# Patient Record
Sex: Female | Born: 1970 | Race: White | Hispanic: No | Smoking: Never smoker
Health system: Southern US, Community
[De-identification: ages and names within clinical notes are randomized; demographics above are authoritative.]

## PROBLEM LIST (undated history)

## (undated) DIAGNOSIS — Q631 Lobulated, fused and horseshoe kidney: Secondary | ICD-10-CM

## (undated) DIAGNOSIS — T7840XA Allergy, unspecified, initial encounter: Secondary | ICD-10-CM

## (undated) DIAGNOSIS — M1990 Unspecified osteoarthritis, unspecified site: Secondary | ICD-10-CM

## (undated) DIAGNOSIS — E079 Disorder of thyroid, unspecified: Secondary | ICD-10-CM

## (undated) DIAGNOSIS — D649 Anemia, unspecified: Secondary | ICD-10-CM

## (undated) DIAGNOSIS — Z8669 Personal history of other diseases of the nervous system and sense organs: Secondary | ICD-10-CM

## (undated) DIAGNOSIS — I10 Essential (primary) hypertension: Secondary | ICD-10-CM

## (undated) DIAGNOSIS — N029 Recurrent and persistent hematuria with unspecified morphologic changes: Secondary | ICD-10-CM

## (undated) DIAGNOSIS — E785 Hyperlipidemia, unspecified: Secondary | ICD-10-CM

## (undated) DIAGNOSIS — N920 Excessive and frequent menstruation with regular cycle: Secondary | ICD-10-CM

## (undated) HISTORY — DX: Unspecified osteoarthritis, unspecified site: M19.90

## (undated) HISTORY — DX: Personal history of other diseases of the nervous system and sense organs: Z86.69

## (undated) HISTORY — DX: Essential (primary) hypertension: I10

## (undated) HISTORY — PX: CHOLECYSTECTOMY: SHX55

## (undated) HISTORY — DX: Excessive and frequent menstruation with regular cycle: N92.0

## (undated) HISTORY — PX: TYMPANOSTOMY: SHX2586

## (undated) HISTORY — DX: Allergy, unspecified, initial encounter: T78.40XA

## (undated) HISTORY — DX: Hyperlipidemia, unspecified: E78.5

## (undated) HISTORY — PX: TUBAL LIGATION: SHX77

## (undated) HISTORY — DX: Anemia, unspecified: D64.9

## (undated) HISTORY — DX: Lobulated, fused and horseshoe kidney: Q63.1

## (undated) HISTORY — PX: SHOULDER SURGERY: SHX246

## (undated) HISTORY — DX: Recurrent and persistent hematuria with unspecified morphologic changes: N02.9

---

## 1999-01-28 ENCOUNTER — Other Ambulatory Visit: Admission: RE | Admit: 1999-01-28 | Discharge: 1999-01-28 | Payer: Self-pay | Admitting: *Deleted

## 1999-09-27 ENCOUNTER — Encounter: Payer: Self-pay | Admitting: Emergency Medicine

## 1999-09-27 ENCOUNTER — Emergency Department (HOSPITAL_COMMUNITY): Admission: EM | Admit: 1999-09-27 | Discharge: 1999-09-27 | Payer: Self-pay | Admitting: Emergency Medicine

## 2000-09-07 ENCOUNTER — Other Ambulatory Visit: Admission: RE | Admit: 2000-09-07 | Discharge: 2000-09-07 | Payer: Self-pay | Admitting: *Deleted

## 2001-02-06 ENCOUNTER — Inpatient Hospital Stay (HOSPITAL_COMMUNITY): Admission: AD | Admit: 2001-02-06 | Discharge: 2001-02-06 | Payer: Self-pay | Admitting: *Deleted

## 2001-02-27 ENCOUNTER — Inpatient Hospital Stay (HOSPITAL_COMMUNITY): Admission: AD | Admit: 2001-02-27 | Discharge: 2001-02-27 | Payer: Self-pay | Admitting: Obstetrics and Gynecology

## 2001-02-28 ENCOUNTER — Inpatient Hospital Stay (HOSPITAL_COMMUNITY): Admission: AD | Admit: 2001-02-28 | Discharge: 2001-02-28 | Payer: Self-pay | Admitting: Obstetrics and Gynecology

## 2001-03-20 ENCOUNTER — Inpatient Hospital Stay (HOSPITAL_COMMUNITY): Admission: AD | Admit: 2001-03-20 | Discharge: 2001-03-22 | Payer: Self-pay | Admitting: Obstetrics and Gynecology

## 2001-03-20 ENCOUNTER — Encounter (INDEPENDENT_AMBULATORY_CARE_PROVIDER_SITE_OTHER): Payer: Self-pay

## 2001-05-03 ENCOUNTER — Other Ambulatory Visit: Admission: RE | Admit: 2001-05-03 | Discharge: 2001-05-03 | Payer: Self-pay | Admitting: Obstetrics and Gynecology

## 2001-11-12 ENCOUNTER — Emergency Department (HOSPITAL_COMMUNITY): Admission: EM | Admit: 2001-11-12 | Discharge: 2001-11-13 | Payer: Self-pay | Admitting: Emergency Medicine

## 2004-06-01 ENCOUNTER — Emergency Department (HOSPITAL_COMMUNITY): Admission: EM | Admit: 2004-06-01 | Discharge: 2004-06-01 | Payer: Self-pay | Admitting: Emergency Medicine

## 2005-04-15 ENCOUNTER — Emergency Department (HOSPITAL_COMMUNITY): Admission: EM | Admit: 2005-04-15 | Discharge: 2005-04-15 | Payer: Self-pay | Admitting: Emergency Medicine

## 2007-12-15 ENCOUNTER — Encounter: Payer: Self-pay | Admitting: Family Medicine

## 2008-01-05 ENCOUNTER — Encounter: Payer: Self-pay | Admitting: Family Medicine

## 2008-01-11 ENCOUNTER — Encounter: Payer: Self-pay | Admitting: Family Medicine

## 2008-01-24 ENCOUNTER — Encounter: Payer: Self-pay | Admitting: Family Medicine

## 2008-01-27 ENCOUNTER — Ambulatory Visit: Payer: Self-pay | Admitting: Family Medicine

## 2008-01-27 DIAGNOSIS — M1990 Unspecified osteoarthritis, unspecified site: Secondary | ICD-10-CM | POA: Insufficient documentation

## 2008-01-27 DIAGNOSIS — J309 Allergic rhinitis, unspecified: Secondary | ICD-10-CM | POA: Insufficient documentation

## 2008-01-27 DIAGNOSIS — D509 Iron deficiency anemia, unspecified: Secondary | ICD-10-CM | POA: Insufficient documentation

## 2008-01-28 ENCOUNTER — Telehealth: Payer: Self-pay | Admitting: Family Medicine

## 2008-01-31 LAB — CONVERTED CEMR LAB
ALT: 18 units/L (ref 0–35)
AST: 24 units/L (ref 0–37)
Albumin: 3.6 g/dL (ref 3.5–5.2)
Alkaline Phosphatase: 76 units/L (ref 39–117)
BUN: 8 mg/dL (ref 6–23)
Basophils Absolute: 0.2 10*3/uL — ABNORMAL HIGH (ref 0.0–0.1)
Bilirubin, Direct: 0.1 mg/dL (ref 0.0–0.3)
CO2: 29 meq/L (ref 19–32)
Calcium: 9 mg/dL (ref 8.4–10.5)
Chloride: 106 meq/L (ref 96–112)
Creatinine, Ser: 0.5 mg/dL (ref 0.4–1.2)
Eosinophils Absolute: 0.4 10*3/uL (ref 0.0–0.7)
Eosinophils Relative: 3.2 % (ref 0.0–5.0)
Ferritin: 2.8 ng/mL — ABNORMAL LOW (ref 10.0–291.0)
Folate: 16.5 ng/mL
GFR calc Af Amer: 178 mL/min
GFR calc non Af Amer: 147 mL/min
Glucose, Bld: 85 mg/dL (ref 70–99)
HCT: 25.6 % — ABNORMAL LOW (ref 36.0–46.0)
Hemoglobin: 7.8 g/dL — CL (ref 12.0–15.0)
Iron: 31 ug/dL — ABNORMAL LOW (ref 42–145)
Lymphocytes Relative: 7.5 % — ABNORMAL LOW (ref 12.0–46.0)
MCHC: 30.5 g/dL (ref 30.0–36.0)
MCV: 57.7 fL — ABNORMAL LOW (ref 78.0–100.0)
Monocytes Absolute: 0.1 10*3/uL (ref 0.1–1.0)
Monocytes Relative: 0.9 % — ABNORMAL LOW (ref 3.0–12.0)
Neutro Abs: 9.6 10*3/uL — ABNORMAL HIGH (ref 1.4–7.7)
Neutrophils Relative %: 86.6 % — ABNORMAL HIGH (ref 43.0–77.0)
Platelets: 506 10*3/uL — ABNORMAL HIGH (ref 150–400)
Potassium: 4.2 meq/L (ref 3.5–5.1)
RBC: 4.44 M/uL (ref 3.87–5.11)
RDW: 20.7 % — ABNORMAL HIGH (ref 11.5–14.6)
Sodium: 141 meq/L (ref 135–145)
TSH: 1.26 microintl units/mL (ref 0.35–5.50)
Total Bilirubin: 1 mg/dL (ref 0.3–1.2)
Total Protein: 8 g/dL (ref 6.0–8.3)
Vitamin B-12: 739 pg/mL (ref 211–911)
WBC: 10.8 10*3/uL — ABNORMAL HIGH (ref 4.5–10.5)

## 2008-02-02 ENCOUNTER — Other Ambulatory Visit: Admission: RE | Admit: 2008-02-02 | Discharge: 2008-02-02 | Payer: Self-pay | Admitting: Family Medicine

## 2008-02-02 ENCOUNTER — Ambulatory Visit: Payer: Self-pay | Admitting: Family Medicine

## 2008-02-02 ENCOUNTER — Encounter: Payer: Self-pay | Admitting: Family Medicine

## 2008-02-02 DIAGNOSIS — N920 Excessive and frequent menstruation with regular cycle: Secondary | ICD-10-CM | POA: Insufficient documentation

## 2008-02-08 ENCOUNTER — Encounter: Payer: Self-pay | Admitting: Family Medicine

## 2008-02-15 ENCOUNTER — Encounter: Admission: RE | Admit: 2008-02-15 | Discharge: 2008-02-15 | Payer: Self-pay | Admitting: Family Medicine

## 2008-03-03 ENCOUNTER — Telehealth: Payer: Self-pay | Admitting: Family Medicine

## 2008-04-24 ENCOUNTER — Ambulatory Visit: Payer: Self-pay | Admitting: Family Medicine

## 2008-04-26 LAB — CONVERTED CEMR LAB
Basophils Absolute: 0.1 10*3/uL (ref 0.0–0.1)
Basophils Relative: 1.4 % (ref 0.0–3.0)
Eosinophils Absolute: 0.4 10*3/uL (ref 0.0–0.7)
Eosinophils Relative: 4.3 % (ref 0.0–5.0)
Ferritin: 20.4 ng/mL (ref 10.0–291.0)
HCT: 40.5 % (ref 36.0–46.0)
Hemoglobin: 14.4 g/dL (ref 12.0–15.0)
Lymphocytes Relative: 31.7 % (ref 12.0–46.0)
MCHC: 35.5 g/dL (ref 30.0–36.0)
MCV: 88.3 fL (ref 78.0–100.0)
Monocytes Absolute: 0.8 10*3/uL (ref 0.1–1.0)
Monocytes Relative: 7.7 % (ref 3.0–12.0)
Neutro Abs: 5.4 10*3/uL (ref 1.4–7.7)
Neutrophils Relative %: 54.9 % (ref 43.0–77.0)
Platelets: 323 10*3/uL (ref 150–400)
RBC: 4.59 M/uL (ref 3.87–5.11)
RDW: 12.9 % (ref 11.5–14.6)
WBC: 9.8 10*3/uL (ref 4.5–10.5)

## 2008-06-28 ENCOUNTER — Ambulatory Visit: Payer: Self-pay | Admitting: Family Medicine

## 2008-06-28 DIAGNOSIS — J209 Acute bronchitis, unspecified: Secondary | ICD-10-CM | POA: Insufficient documentation

## 2008-07-14 ENCOUNTER — Encounter: Payer: Self-pay | Admitting: Family Medicine

## 2008-12-06 ENCOUNTER — Ambulatory Visit: Payer: Self-pay | Admitting: Family Medicine

## 2009-01-16 ENCOUNTER — Ambulatory Visit: Payer: Self-pay | Admitting: Family Medicine

## 2009-01-16 LAB — CONVERTED CEMR LAB
Bilirubin Urine: NEGATIVE
Glucose, Urine, Semiquant: NEGATIVE
Ketones, urine, test strip: NEGATIVE
Nitrite: NEGATIVE
Specific Gravity, Urine: >=1.03
Urobilinogen, UA: 0.2
WBC Urine, dipstick: NEGATIVE
pH: 5

## 2009-01-22 ENCOUNTER — Telehealth: Payer: Self-pay | Admitting: Family Medicine

## 2009-01-22 LAB — CONVERTED CEMR LAB
ALT: 20 units/L (ref 0–35)
AST: 23 units/L (ref 0–37)
Albumin: 3.5 g/dL (ref 3.5–5.2)
Alkaline Phosphatase: 71 units/L (ref 39–117)
BUN: 8 mg/dL (ref 6–23)
Basophils Absolute: 0.1 10*3/uL (ref 0.0–0.1)
Basophils Relative: 1 % (ref 0.0–3.0)
Bilirubin, Direct: 0 mg/dL (ref 0.0–0.3)
CO2: 23 meq/L (ref 19–32)
Calcium: 8.6 mg/dL (ref 8.4–10.5)
Chloride: 108 meq/L (ref 96–112)
Cholesterol: 242 mg/dL — ABNORMAL HIGH (ref 0–200)
Creatinine, Ser: 0.8 mg/dL (ref 0.4–1.2)
Direct LDL: 191.1 mg/dL
Eosinophils Absolute: 0.2 10*3/uL (ref 0.0–0.7)
Eosinophils Relative: 2.9 % (ref 0.0–5.0)
GFR calc non Af Amer: 84.76 mL/min (ref >60)
Glucose, Bld: 86 mg/dL (ref 70–99)
HCT: 38.6 % (ref 36.0–46.0)
HDL: 39.9 mg/dL (ref >39.00)
Hemoglobin: 13.1 g/dL (ref 12.0–15.0)
Lymphocytes Relative: 28 % (ref 12.0–46.0)
Lymphs Abs: 2.4 10*3/uL (ref 0.7–4.0)
MCHC: 33.8 g/dL (ref 30.0–36.0)
MCV: 87.3 fL (ref 78.0–100.0)
Monocytes Absolute: 0.6 10*3/uL (ref 0.1–1.0)
Monocytes Relative: 7.2 % (ref 3.0–12.0)
Neutro Abs: 5.3 10*3/uL (ref 1.4–7.7)
Neutrophils Relative %: 60.9 % (ref 43.0–77.0)
Platelets: 245 10*3/uL (ref 150.0–400.0)
Potassium: 3.8 meq/L (ref 3.5–5.1)
RBC: 4.42 M/uL (ref 3.87–5.11)
RDW: 13.7 % (ref 11.5–14.6)
Sodium: 140 meq/L (ref 135–145)
TSH: 3.04 microintl units/mL (ref 0.35–5.50)
Total Bilirubin: 0.6 mg/dL (ref 0.3–1.2)
Total CHOL/HDL Ratio: 6
Total Protein: 7.3 g/dL (ref 6.0–8.3)
Triglycerides: 176 mg/dL — ABNORMAL HIGH (ref 0.0–149.0)
VLDL: 35.2 mg/dL (ref 0.0–40.0)
WBC: 8.6 10*3/uL (ref 4.5–10.5)

## 2009-02-06 ENCOUNTER — Ambulatory Visit: Payer: Self-pay | Admitting: Family Medicine

## 2009-02-06 ENCOUNTER — Other Ambulatory Visit: Admission: RE | Admit: 2009-02-06 | Discharge: 2009-02-06 | Payer: Self-pay | Admitting: Family Medicine

## 2009-02-09 ENCOUNTER — Encounter: Payer: Self-pay | Admitting: Family Medicine

## 2009-02-14 ENCOUNTER — Encounter: Payer: Self-pay | Admitting: Family Medicine

## 2009-03-01 ENCOUNTER — Emergency Department (HOSPITAL_COMMUNITY): Admission: EM | Admit: 2009-03-01 | Discharge: 2009-03-01 | Payer: Self-pay | Admitting: Emergency Medicine

## 2009-03-21 ENCOUNTER — Ambulatory Visit: Payer: Self-pay | Admitting: Family Medicine

## 2009-03-21 DIAGNOSIS — H8110 Benign paroxysmal vertigo, unspecified ear: Secondary | ICD-10-CM | POA: Insufficient documentation

## 2009-03-21 DIAGNOSIS — J069 Acute upper respiratory infection, unspecified: Secondary | ICD-10-CM | POA: Insufficient documentation

## 2009-04-24 ENCOUNTER — Ambulatory Visit: Payer: Self-pay | Admitting: Family Medicine

## 2009-04-25 LAB — CONVERTED CEMR LAB
Cholesterol: 236 mg/dL — ABNORMAL HIGH (ref 0–200)
Direct LDL: 168 mg/dL
HDL: 53 mg/dL (ref >39.00)
Total CHOL/HDL Ratio: 4
Triglycerides: 178 mg/dL — ABNORMAL HIGH (ref 0.0–149.0)
VLDL: 35.6 mg/dL (ref 0.0–40.0)

## 2009-06-01 ENCOUNTER — Ambulatory Visit: Payer: Self-pay | Admitting: Family Medicine

## 2009-06-01 DIAGNOSIS — J019 Acute sinusitis, unspecified: Secondary | ICD-10-CM | POA: Insufficient documentation

## 2009-07-03 ENCOUNTER — Telehealth: Payer: Self-pay | Admitting: Family Medicine

## 2009-07-04 ENCOUNTER — Ambulatory Visit: Payer: Self-pay | Admitting: Family Medicine

## 2009-07-04 DIAGNOSIS — M546 Pain in thoracic spine: Secondary | ICD-10-CM | POA: Insufficient documentation

## 2009-07-31 ENCOUNTER — Ambulatory Visit: Payer: Self-pay | Admitting: Family Medicine

## 2009-07-31 ENCOUNTER — Telehealth: Payer: Self-pay | Admitting: Family Medicine

## 2009-08-09 LAB — CONVERTED CEMR LAB
ALT: 21 units/L (ref 0–35)
AST: 26 units/L (ref 0–37)
Albumin: 3.4 g/dL — ABNORMAL LOW (ref 3.5–5.2)
Alkaline Phosphatase: 80 units/L (ref 39–117)
Bilirubin, Direct: 0.1 mg/dL (ref 0.0–0.3)
Cholesterol: 203 mg/dL — ABNORMAL HIGH (ref 0–200)
Direct LDL: 137.1 mg/dL
HDL: 48.9 mg/dL (ref >39.00)
Total Bilirubin: 0.6 mg/dL (ref 0.3–1.2)
Total CHOL/HDL Ratio: 4
Total Protein: 7 g/dL (ref 6.0–8.3)
Triglycerides: 143 mg/dL (ref 0.0–149.0)
VLDL: 28.6 mg/dL (ref 0.0–40.0)

## 2009-08-10 ENCOUNTER — Telehealth: Payer: Self-pay | Admitting: Family Medicine

## 2009-09-26 ENCOUNTER — Encounter: Admission: RE | Admit: 2009-09-26 | Discharge: 2009-09-26 | Payer: Self-pay | Admitting: Family Medicine

## 2009-10-04 ENCOUNTER — Emergency Department (HOSPITAL_COMMUNITY): Admission: EM | Admit: 2009-10-04 | Discharge: 2009-10-04 | Payer: Self-pay | Admitting: Emergency Medicine

## 2009-10-16 ENCOUNTER — Ambulatory Visit: Payer: Self-pay | Admitting: Family Medicine

## 2009-10-16 DIAGNOSIS — K8020 Calculus of gallbladder without cholecystitis without obstruction: Secondary | ICD-10-CM | POA: Insufficient documentation

## 2009-10-16 DIAGNOSIS — Q638 Other specified congenital malformations of kidney: Secondary | ICD-10-CM | POA: Insufficient documentation

## 2009-10-30 ENCOUNTER — Encounter: Payer: Self-pay | Admitting: Family Medicine

## 2009-11-08 ENCOUNTER — Encounter: Payer: Self-pay | Admitting: Family Medicine

## 2009-11-23 ENCOUNTER — Ambulatory Visit: Payer: Self-pay | Admitting: Family Medicine

## 2009-11-23 DIAGNOSIS — N390 Urinary tract infection, site not specified: Secondary | ICD-10-CM | POA: Insufficient documentation

## 2009-11-23 LAB — CONVERTED CEMR LAB
Bilirubin Urine: NEGATIVE
Glucose, Urine, Semiquant: NEGATIVE
Ketones, urine, test strip: NEGATIVE
Nitrite: NEGATIVE
Protein, U semiquant: 100
Urobilinogen, UA: 0.2
pH: 7.5

## 2009-11-24 ENCOUNTER — Encounter: Payer: Self-pay | Admitting: Family Medicine

## 2009-12-11 ENCOUNTER — Ambulatory Visit: Payer: Self-pay | Admitting: Family Medicine

## 2010-01-14 ENCOUNTER — Ambulatory Visit (HOSPITAL_COMMUNITY): Admission: RE | Admit: 2010-01-14 | Discharge: 2010-01-14 | Payer: Self-pay | Admitting: Surgery

## 2010-01-29 ENCOUNTER — Telehealth: Payer: Self-pay | Admitting: Family Medicine

## 2010-02-05 ENCOUNTER — Encounter: Payer: Self-pay | Admitting: Family Medicine

## 2010-02-27 ENCOUNTER — Telehealth: Payer: Self-pay | Admitting: Family Medicine

## 2010-02-28 ENCOUNTER — Ambulatory Visit
Admission: RE | Admit: 2010-02-28 | Discharge: 2010-02-28 | Payer: Self-pay | Source: Home / Self Care | Attending: Family Medicine | Admitting: Family Medicine

## 2010-03-18 ENCOUNTER — Other Ambulatory Visit (HOSPITAL_COMMUNITY)
Admission: RE | Admit: 2010-03-18 | Discharge: 2010-03-18 | Disposition: A | Discharge status: Home / Self Care | Payer: BC Managed Care – HMO | Source: Ambulatory Visit | Attending: Family Medicine | Admitting: Family Medicine

## 2010-03-18 ENCOUNTER — Ambulatory Visit
Admission: RE | Admit: 2010-03-18 | Discharge: 2010-03-18 | Payer: Self-pay | Source: Home / Self Care | Attending: Family Medicine | Admitting: Family Medicine

## 2010-03-18 ENCOUNTER — Other Ambulatory Visit (HOSPITAL_COMMUNITY): Admission: RE | Admit: 2010-03-18 | Payer: BC Managed Care – HMO | Source: Ambulatory Visit | Admitting: Family Medicine

## 2010-03-18 ENCOUNTER — Other Ambulatory Visit: Payer: Self-pay | Admitting: Family Medicine

## 2010-03-18 DIAGNOSIS — Z01419 Encounter for gynecological examination (general) (routine) without abnormal findings: Secondary | ICD-10-CM | POA: Insufficient documentation

## 2010-03-18 DIAGNOSIS — M6740 Ganglion, unspecified site: Secondary | ICD-10-CM | POA: Insufficient documentation

## 2010-03-19 ENCOUNTER — Ambulatory Visit
Admission: RE | Admit: 2010-03-19 | Discharge: 2010-03-19 | Payer: Self-pay | Source: Home / Self Care | Attending: Family Medicine | Admitting: Family Medicine

## 2010-03-19 ENCOUNTER — Other Ambulatory Visit: Payer: Self-pay | Admitting: Family Medicine

## 2010-03-19 LAB — CBC WITH DIFFERENTIAL/PLATELET
Basophils Absolute: 0.1 10*3/uL (ref 0.0–0.1)
Basophils Relative: 0.6 % (ref 0.0–3.0)
Eosinophils Absolute: 0.1 10*3/uL (ref 0.0–0.7)
Eosinophils Relative: 1 % (ref 0.0–5.0)
HCT: 35.6 % — ABNORMAL LOW (ref 36.0–46.0)
Hemoglobin: 11.7 g/dL — ABNORMAL LOW (ref 12.0–15.0)
Lymphocytes Relative: 21.7 % (ref 12.0–46.0)
Lymphs Abs: 2.8 10*3/uL (ref 0.7–4.0)
MCHC: 32.9 g/dL (ref 30.0–36.0)
MCV: 75.7 fl — ABNORMAL LOW (ref 78.0–100.0)
Monocytes Absolute: 0.9 10*3/uL (ref 0.1–1.0)
Monocytes Relative: 7.1 % (ref 3.0–12.0)
Neutro Abs: 9 10*3/uL — ABNORMAL HIGH (ref 1.4–7.7)
Neutrophils Relative %: 69.6 % (ref 43.0–77.0)
Platelets: 295 10*3/uL (ref 150.0–400.0)
RBC: 4.7 Mil/uL (ref 3.87–5.11)
RDW: 19.5 % — ABNORMAL HIGH (ref 11.5–14.6)
WBC: 12.9 10*3/uL — ABNORMAL HIGH (ref 4.5–10.5)

## 2010-03-19 LAB — CONVERTED CEMR LAB
Bilirubin Urine: NEGATIVE
Glucose, Urine, Semiquant: NEGATIVE
Ketones, urine, test strip: NEGATIVE
Nitrite: NEGATIVE
Specific Gravity, Urine: 1.025
Urobilinogen, UA: 0.2
WBC Urine, dipstick: NEGATIVE
pH: 5.5

## 2010-03-19 LAB — LIPID PANEL
Cholesterol: 187 mg/dL (ref 0–200)
HDL: 57.1 mg/dL (ref >39.00)
LDL Cholesterol: 109 mg/dL — ABNORMAL HIGH (ref 0–99)
Total CHOL/HDL Ratio: 3
Triglycerides: 104 mg/dL (ref 0.0–149.0)
VLDL: 20.8 mg/dL (ref 0.0–40.0)

## 2010-03-19 LAB — HEPATIC FUNCTION PANEL
ALT: 26 U/L (ref 0–35)
AST: 28 U/L (ref 0–37)
Albumin: 3.3 g/dL — ABNORMAL LOW (ref 3.5–5.2)
Alkaline Phosphatase: 79 U/L (ref 39–117)
Bilirubin, Direct: 0.1 mg/dL (ref 0.0–0.3)
Total Bilirubin: 0.4 mg/dL (ref 0.3–1.2)
Total Protein: 7 g/dL (ref 6.0–8.3)

## 2010-03-19 LAB — BASIC METABOLIC PANEL
BUN: 12 mg/dL (ref 6–23)
CO2: 27 mEq/L (ref 19–32)
Calcium: 8.8 mg/dL (ref 8.4–10.5)
Chloride: 105 mEq/L (ref 96–112)
Creatinine, Ser: 0.8 mg/dL (ref 0.4–1.2)
GFR: 89.39 mL/min (ref >60.00)
Glucose, Bld: 72 mg/dL (ref 70–99)
Potassium: 4.3 mEq/L (ref 3.5–5.1)
Sodium: 140 mEq/L (ref 135–145)

## 2010-03-19 LAB — TSH: TSH: 2.92 u[IU]/mL (ref 0.35–5.50)

## 2010-03-22 ENCOUNTER — Encounter: Payer: Self-pay | Admitting: Family Medicine

## 2010-03-26 NOTE — Progress Notes (Signed)
Summary: back pain  Phone Note Call from Patient   Caller: Patient Call For: Nelwyn Salisbury MD Summary of Call: Left mid back pain when awakened this am, and now is having fairly severe pain, and a catching sensation when moving.  Has Vicodin, and a muscle relaxer at home.  Wants to know if she should take this and make appt for tomorow, or does she need to be worked in today? No injury. No symptoms of pneumonia or UTI. [phone] Initial call taken by: Lynann Beaver CMA,  Jul 03, 2009 3:17 PM  Follow-up for Phone Call        Use the relaxers and Vicodin. if not better by tomorrow let me see her then Follow-up by: Nelwyn Salisbury MD,  Jul 03, 2009 3:31 PM  Additional Follow-up for Phone Call Additional follow up Details #1::        Pt given Dr. Claris Che recommendations. Additional Follow-up by: Lynann Beaver CMA,  Jul 03, 2009 3:40 PM

## 2010-03-26 NOTE — Assessment & Plan Note (Signed)
Summary: CPX-PAP//CCM   Vital Signs:  Patient profile:   40 year old female Menstrual status:  regular LMP:     01/23/2009 Height:      59.5 inches Weight:      197 pounds Temp:     97.6 degrees F oral Pulse rate:   88 / minute BP sitting:   114 / 84  (left arm) Cuff size:   large  Vitals Entered By: Alfred Levins, CMA (February 06, 2009 1:49 PM) CC: cpx with pap LMP (date): 01/23/2009     Menstrual Status regular Enter LMP: 01/23/2009 Last PAP Result NEGATIVE FOR INTRAEPITHELIAL LESIONS OR MALIGNANCY.   History of Present Illness: 40 yr old female for cpx. Doing well, no concerns. Since getting on an oral contraceptive her periods have been better regulated, and she feels great.   Current Medications (verified): 1)  Ortho-Novum 1/35 (28) 1-35 Mg-Mcg Tabs (Norethindrone-Eth Estradiol) .... Once Daily 2)  Allegra 60 Mg Tabs (Fexofenadine Hcl) .... Two Times A Day As Needed 3)  Flonase 50 Mcg/act Susp (Fluticasone Propionate) .... 2 Sprays Each Nostril Once Daily  Allergies (verified): 1)  ! Penicillin  Past History:  Past Medical History: Reviewed history from 04/24/2008 and no changes required. Allergic rhinitis Anemia-iron deficiency Chickenpox Osteoarthritis, especially in right shoulder, sees Dr. Rennis Chris menorrhagia  Past Surgical History: Reviewed history from 02/02/2008 and no changes required. Caesarean section x2 Tubal ligation  Family History: Reviewed history from 01/27/2008 and no changes required. Family History of Arthritis Family History of CAD Female 1st degree relative <50 Family History Depression Family History Diabetes 1st degree relative Family History High cholesterol Family History of Sudden Death  Social History: Reviewed history from 01/27/2008 and no changes required. Divorced Never Smoked Alcohol use-no Regular exercise-no  Review of Systems  The patient denies anorexia, fever, weight loss, weight gain, vision loss, decreased  hearing, hoarseness, chest pain, syncope, dyspnea on exertion, peripheral edema, prolonged cough, headaches, hemoptysis, abdominal pain, melena, hematochezia, severe indigestion/heartburn, hematuria, incontinence, genital sores, muscle weakness, suspicious skin lesions, transient blindness, difficulty walking, depression, unusual weight change, abnormal bleeding, enlarged lymph nodes, angioedema, breast masses, and testicular masses.    Physical Exam  General:  overweight-appearing.   Head:  Normocephalic and atraumatic without obvious abnormalities. No apparent alopecia or balding. Eyes:  No corneal or conjunctival inflammation noted. EOMI. Perrla. Funduscopic exam benign, without hemorrhages, exudates or papilledema. Vision grossly normal. Ears:  External ear exam shows no significant lesions or deformities.  Otoscopic examination reveals clear canals, tympanic membranes are intact bilaterally without bulging, retraction, inflammation or discharge. Hearing is grossly normal bilaterally. Nose:  External nasal examination shows no deformity or inflammation. Nasal mucosa are pink and moist without lesions or exudates. Mouth:  Oral mucosa and oropharynx without lesions or exudates.  Teeth in good repair. Neck:  No deformities, masses, or tenderness noted. Chest Wall:  No deformities, masses, or tenderness noted. Breasts:  No mass, nodules, thickening, tenderness, bulging, retraction, inflamation, nipple discharge or skin changes noted.   Lungs:  Normal respiratory effort, chest expands symmetrically. Lungs are clear to auscultation, no crackles or wheezes. Heart:  Normal rate and regular rhythm. S1 and S2 normal without gallop, murmur, click, rub or other extra sounds. Abdomen:  Bowel sounds positive,abdomen soft and non-tender without masses, organomegaly or hernias noted. Genitalia:  Pelvic Exam:        External: normal female genitalia without lesions or masses        Vagina: normal without  lesions or  masses        Cervix: normal without lesions or masses        Adnexa: normal bimanual exam without masses or fullness        Uterus: normal by palpation        Pap smear: performed Msk:  No deformity or scoliosis noted of thoracic or lumbar spine.   Pulses:  R and L carotid,radial,femoral,dorsalis pedis and posterior tibial pulses are full and equal bilaterally Extremities:  No clubbing, cyanosis, edema, or deformity noted with normal full range of motion of all joints.   Neurologic:  No cranial nerve deficits noted. Station and gait are normal. Plantar reflexes are down-going bilaterally. DTRs are symmetrical throughout. Sensory, motor and coordinative functions appear intact. Skin:  Intact without suspicious lesions or rashes Cervical Nodes:  No lymphadenopathy noted Axillary Nodes:  No palpable lymphadenopathy Inguinal Nodes:  No significant adenopathy Psych:  Cognition and judgment appear intact. Alert and cooperative with normal attention span and concentration. No apparent delusions, illusions, hallucinations   Impression & Recommendations:  Problem # 1:  HEALTH MAINTENANCE EXAM (ICD-V70.0)  Complete Medication List: 1)  Ortho-novum 1/35 (28) 1-35 Mg-mcg Tabs (Norethindrone-eth estradiol) .... Once daily 2)  Flonase 50 Mcg/act Susp (Fluticasone propionate) .... 2 sprays each nostril once daily 3)  Allegra 180 Mg Tabs (Fexofenadine hcl) .... Once daily  Other Orders: Admin 1st Vaccine (82956) Flu Vaccine 67yrs + (21308) Flu Vaccine Consent Questions     Do you have a history of severe allergic reactions to this vaccine? no    Any prior history of allergic reactions to egg and/or gelatin? no    Do you have a sensitivity to the preservative Thimersol? no    Do you have a past history of Guillan-Barre Syndrome? no    Do you currently have an acute febrile illness? no    Have you ever had a severe reaction to latex? no    Vaccine information given and explained to  patient? yes    Are you currently pregnant? no    Lot Number:[id number]   Exp Date:08/23/2009   Site Given  Left Deltoid IMccine 81yrs + (65784)  Patient Instructions: 1)  It is important that you exercise reguarly at least 20 minutes 5 times a week. If you develop chest pain, have severe difficulty breathing, or feel very tired, stop exercising immediately and seek medical attention.  2)  You need to lose weight. Consider a lower calorie diet and regular exercise.  3)  Please schedule a follow-up appointment in 1 year.  Prescriptions: FLONASE 50 MCG/ACT SUSP (FLUTICASONE PROPIONATE) 2 sprays each nostril once daily  #30 x 11   Entered and Authorized by:   Nelwyn Salisbury MD   Signed by:   Nelwyn Salisbury MD on 02/06/2009   Method used:   Electronically to        [facility] [address] (retail)       [phone]       [place], [place]  [address]       Ph: [phone]       Fax: [phone]   RxID:   [id number] ALLEGRA 180 MG TABS (FEXOFENADINE HCL) once daily  #30 x 11   Entered and Authorized by:   Nelwyn Salisbury MD   Signed by:   Nelwyn Salisbury MD on 02/06/2009   Method used:   Electronically to  [facility] [address] (retail)       [phone]       [place], [place]  [address]       Ph: [phone]       Fax: [phone]   RxID:   [id number] ORTHO-NOVUM 1/35 (28) 1-35 MG-MCG TABS (NORETHINDRONE-ETH ESTRADIOL) once daily  #30 x 11   Entered and Authorized by:   Nelwyn Salisbury MD   Signed by:   Nelwyn Salisbury MD on 02/06/2009   Method used:   Electronically to        [facility] [address] (retail)       [phone]       [place], [place]  [address]       Ph: [phone]       Fax: [phone]   RxID:   [id number]    .lbflu

## 2010-03-26 NOTE — Assessment & Plan Note (Signed)
Summary: pap smear/cdw   Vital Signs:  Patient Profile:   40 Years Old Female Height:     60 inches Weight:      181 pounds Temp:     100.4 degrees F oral Pulse rate:   104 / minute BP sitting:   122 / 82  (left arm) Cuff size:   regular  Vitals Entered By: Alfred Levins, CMA (February 02, 2008 3:33 PM)                 Chief Complaint:  pap smear.  History of Present Illness: Here for a GYN exam with plans to start her back on BCP. She has very heavy periods, and this has led to her becoming quite anemic. Her recent Hgb was 7.8, with a ferritin of 2.8. She is on oral iron therapy.     Current Allergies: No known allergies   Past Medical History:    Reviewed history from 01/27/2008 and no changes required:       Allergic rhinitis       Anemia-iron deficiency       Chickenpox       Osteoarthritis, especially in right shoulder, sees Dr. Rennis Chris         Past Surgical History:    Reviewed history from 01/27/2008 and no changes required:       Caesarean section x2       Tubal ligation     Review of Systems  The patient denies anorexia, fever, weight loss, weight gain, vision loss, decreased hearing, hoarseness, chest pain, syncope, dyspnea on exertion, peripheral edema, prolonged cough, headaches, hemoptysis, abdominal pain, melena, hematochezia, severe indigestion/heartburn, hematuria, incontinence, genital sores, muscle weakness, suspicious skin lesions, transient blindness, difficulty walking, depression, unusual weight change, abnormal bleeding, enlarged lymph nodes, angioedema, breast masses, and testicular masses.     Physical Exam  General:     overweight-appearing.   Breasts:     No mass, nodules, thickening, tenderness, bulging, retraction, inflamation, nipple discharge or skin changes noted.   Abdomen:     Bowel sounds positive,abdomen soft and non-tender without masses, organomegaly or hernias noted. Genitalia:     Pelvic Exam:        External: normal  female genitalia without lesions or masses        Vagina: normal without lesions or masses        Cervix: normal without lesions or masses        Adnexa: normal bimanual exam without masses or fullness        Uterus: normal by palpation        Pap smear: performed    Impression & Recommendations:  Problem # 1:  MENORRHAGIA (ICD-626.2)  Her updated medication list for this problem includes:    Ortho-novum 1/35 (28) 1-35 Mg-mcg Tabs (Norethindrone-eth estradiol) ..... Once daily   Problem # 2:  ANEMIA-IRON DEFICIENCY (ICD-280.9)  Her updated medication list for this problem includes:    Ferrous Sulfate 325 (65 Fe) Mg Tabs (Ferrous sulfate) .Marland Kitchen..Marland Kitchen Two times a day   Complete Medication List: 1)  Ferrous Sulfate 325 (65 Fe) Mg Tabs (Ferrous sulfate) .... Two times a day 2)  Ortho-novum 1/35 (28) 1-35 Mg-mcg Tabs (Norethindrone-eth estradiol) .... Once daily   Patient Instructions: 1)  Start ON 1/35 daily. Continue iron pills.  2)  Please schedule a follow-up appointment in 3 months.   Prescriptions: ORTHO-NOVUM 1/35 (28) 1-35 MG-MCG TABS (NORETHINDRONE-ETH ESTRADIOL) once daily  #28 x 11   Entered  and Authorized by:   Nelwyn Salisbury MD   Signed by:   Nelwyn Salisbury MD on 02/02/2008   Method used:   Print then Give to Patient   RxID:   [id number]  ]

## 2010-03-26 NOTE — Progress Notes (Signed)
Summary: REFILL REQUEST  Phone Note Refill Request Message from:  Patient on January 29, 2010 11:16 AM  Refills Requested: Medication #1:  ORTHO-NOVUM 1/35 (28) 1-35 MG-MCG TABS once daily   Notes: [facility] [place].Marland KitchenMarland KitchenMarland KitchenPt has appt for cpx on 03/14/2010.    Initial call taken by: Debbra Riding,  January 29, 2010 11:17 AM  Follow-up for Phone Call        escript on 12-1 but [facility] with 2 refills Follow-up by: Pura Spice, RN,  January 29, 2010 2:45 PM     Appended Document: REFILL REQUEST pt aware.

## 2010-03-26 NOTE — Progress Notes (Signed)
Summary: pt returning your call  Phone Note Call from Patient Call back at [phone]   Caller: Patient Summary of Call: Pt is returning your call. Please call back asap.  Initial call taken by: Lucy Antigua,  January 22, 2009 8:26 AM  Follow-up for Phone Call        pt aware of lab results Follow-up by: Alfred Levins, CMA,  January 22, 2009 9:58 AM

## 2010-03-26 NOTE — Assessment & Plan Note (Signed)
Summary: SEVERE PAIN IN MID BACK/CJR   Vital Signs:  Patient profile:   40 year old female Menstrual status:  regular Weight:      203 pounds BMI:     40.46 O2 Sat:      98 % on Room air BP sitting:   128 / 92 Cuff size:   regular  Vitals Entered By: Raechel Ache, RN (Jul 04, 2009 10:39 AM)  O2 Flow:  Room air CC: C/o pain under L shoulder blade since yesterday morning.   History of Present Illness: Here for 2 days of sharp pains in the left side of her upper back near the shoulder blade. No trauma recently. She woke up with this pain 2 mornings ago, and it steadily worsening through the day yesterday. Last night she took Vicodin and Robaxin, and she slept well. Today the pain is much improved. No SOB, no coughing. She has never experienced this before.   Allergies: 1)  ! Penicillin  Past History:  Past Medical History: Reviewed history from 04/24/2008 and no changes required. Allergic rhinitis Anemia-iron deficiency Chickenpox Osteoarthritis, especially in right shoulder, sees Dr. Rennis Chris menorrhagia  Past Surgical History: Reviewed history from 03/21/2009 and no changes required. Caesarean section x2 Tubal ligation PE tubes  Review of Systems  The patient denies anorexia, fever, weight loss, weight gain, vision loss, decreased hearing, hoarseness, chest pain, syncope, dyspnea on exertion, peripheral edema, prolonged cough, headaches, hemoptysis, abdominal pain, melena, hematochezia, severe indigestion/heartburn, hematuria, incontinence, genital sores, muscle weakness, suspicious skin lesions, transient blindness, difficulty walking, depression, unusual weight change, abnormal bleeding, enlarged lymph nodes, angioedema, breast masses, and testicular masses.    Physical Exam  General:  Well-developed,well-nourished,in no acute distress; alert,appropriate and cooperative throughout examination Neck:  No deformities, masses, or tenderness noted. Lungs:  Normal  respiratory effort, chest expands symmetrically. Lungs are clear to auscultation, no crackles or wheezes. Heart:  Normal rate and regular rhythm. S1 and S2 normal without gallop, murmur, click, rub or other extra sounds. Msk:  tender along the left side of the thoracic spine and over towards the left scapula. No masses. Full ROM.   Impression & Recommendations:  Problem # 1:  BACK PAIN, THORACIC REGION (ICD-724.1)  Her updated medication list for this problem includes:    Robaxin-750 750 Mg Tabs (Methocarbamol) .Marland Kitchen... 1 q 6 hours as needed spasm    Vicodin 5-500 Mg Tabs (Hydrocodone-acetaminophen) .Marland Kitchen... 1 q 4 hours as needed pain  Complete Medication List: 1)  Ortho-novum 1/35 (28) 1-35 Mg-mcg Tabs (Norethindrone-eth estradiol) .... Once daily 2)  Flonase 50 Mcg/act Susp (Fluticasone propionate) .... 2 sprays each nostril once daily 3)  Allegra 180 Mg Tabs (Fexofenadine hcl) .... Once daily 4)  Simvastatin 40 Mg Tabs (Simvastatin) .... Take one tablet at bedtime 5)  Robaxin-750 750 Mg Tabs (Methocarbamol) .Marland Kitchen.. 1 q 6 hours as needed spasm 6)  Vicodin 5-500 Mg Tabs (Hydrocodone-acetaminophen) .Marland Kitchen.. 1 q 4 hours as needed pain  Patient Instructions: 1)  Continue meds as above. use moist heat. Rest.  2)  Please schedule a follow-up appointment as needed .  Prescriptions: VICODIN 5-500 MG TABS (HYDROCODONE-ACETAMINOPHEN) 1 q 4 hours as needed pain  #60 x 0   Entered and Authorized by:   Nelwyn Salisbury MD   Signed by:   Nelwyn Salisbury MD on 07/04/2009   Method used:   Print then Give to Patient   RxID:   [id number] ROBAXIN-750 750 MG TABS (METHOCARBAMOL) 1 q 6 hours  as needed spasm  #60 x 2   Entered and Authorized by:   Nelwyn Salisbury MD   Signed by:   Nelwyn Salisbury MD on 07/04/2009   Method used:   Print then Give to Patient   RxID:   [id number]

## 2010-03-26 NOTE — Assessment & Plan Note (Signed)
Summary: fup gallstone from er//ccm   Vital Signs:  Patient profile:   40 year old female Menstrual status:  regular Weight:      201 pounds Temp:     98.1 degrees F oral BP sitting:   136 / 94  (left arm) Cuff size:   regular  Vitals Entered By: Raechel Ache, RN (October 16, 2009 10:10 AM) CC: F/u ER for gallstones; feels better now.   History of Present Illness: Here to follow up after an ER visit on 10-04-09. She presented then with upper abdominal pain and nausea, and an US revealed a 1.2 cm gallstone. 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 She was told to avoid fatty foods, and she has done well with no more pain since then.   Allergies: 1)  ! Penicillin  Past History:  Past Medical History: Reviewed history from 04/24/2008 and no changes required. Allergic rhinitis Anemia-iron deficiency Chickenpox Osteoarthritis, especially in right shoulder, sees Dr. Rennis Chris menorrhagia  Past Surgical History: Reviewed history from 03/21/2009 and no changes required. Caesarean section x2 Tubal ligation PE tubes  Review of Systems  The patient denies anorexia, fever, weight loss, weight gain, vision loss, decreased hearing, hoarseness, chest pain, syncope, dyspnea on exertion, peripheral edema, prolonged cough, headaches, hemoptysis, abdominal pain, melena, hematochezia, severe indigestion/heartburn, hematuria, incontinence, genital sores, muscle weakness, suspicious skin lesions, transient blindness, difficulty walking, depression, unusual weight change, abnormal bleeding, enlarged lymph nodes, angioedema, breast masses, and testicular masses.    Physical Exam  General:  overweight-appearing.   Lungs:  Normal respiratory effort, chest expands symmetrically. Lungs are clear to auscultation, no crackles or wheezes. Heart:  Normal rate and regular rhythm. S1 and S2 normal without gallop, murmur, click, rub or other extra sounds. Abdomen:  Bowel sounds  positive,abdomen soft and non-tender without masses, organomegaly or hernias noted.   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 Once daily 2)  Flonase 50 Mcg/act Susp (Fluticasone propionate) .... 2 sprays each nostril once daily 3)  Robaxin-750 750 Mg Tabs (Methocarbamol) .Marland Kitchen.. 1 q 6 hours as needed spasm 4)  Vicodin 5-500 Mg Tabs (Hydrocodone-acetaminophen) .Marland Kitchen.. 1 q 4 hours as needed pain 5)  Xyzal 5 Mg Tabs (Levocetirizine dihydrochloride) .Marland Kitchen.. 1 once daily 6)  Lipitor 40 Mg Tabs (Atorvastatin calcium) .Marland Kitchen.. 1 once daily  Patient Instructions: 1)  will refer to Surgery and to Urology.

## 2010-03-26 NOTE — Letter (Signed)
Summary: [facility]  [facility]   Imported By: Maryln Gottron 02/03/2008 13:45:49  _____________________________________________________________________  External Attachment:    Type:   Image     Comment:   External Document

## 2010-03-26 NOTE — Letter (Signed)
Summary: [facility]  [facility]   Imported By: Maryln Gottron 02/03/2008 13:48:05  _____________________________________________________________________  External Attachment:    Type:   Image     Comment:   External Document

## 2010-03-26 NOTE — Progress Notes (Signed)
Summary: CHANGE MED?  Phone Note From Other Clinic   Caller: [facility] ([place] Help Desk) Mora Bellman Summary of Call:  [facility] for [place].... Spoke with Ruby, Coverage Representative who adv that medication:  Fexofenadine 180 mg is not covered by pts insurance ... Therefore pt will have to pay 100% of cost for medication... [facility] Rep adv what medication that is covered and  could replace med prescribed is  Xyzal  or  Clarinex ?     Initial call taken by: Debbra Riding,  July 31, 2009 2:08 PM  Follow-up for Phone Call        call in Xyzal 5 mg once daily , #90 with 3 rf Follow-up by: Nelwyn Salisbury MD,  August 08, 2009 10:03 AM    New/Updated Medications: XYZAL 5 MG TABS (LEVOCETIRIZINE DIHYDROCHLORIDE) 1 once daily Prescriptions: XYZAL 5 MG TABS (LEVOCETIRIZINE DIHYDROCHLORIDE) 1 once daily  #90 x 3   Entered by:   Raechel Ache, RN   Authorized by:   Nelwyn Salisbury MD   Signed by:   Raechel Ache, RN on 08/08/2009   Method used:   Faxed to ...       [facility] (mail-order)             , [place]         Ph: [phone]       Fax: [phone]   RxID:   [id number]   Appended Document: CHANGE MED? Patient called to find out why Xyzal Rx was sent to [facility].

## 2010-03-26 NOTE — Consult Note (Signed)
Summary: [facility]  [facility]   Imported By: Maryln Gottron 11/16/2009 15:15:24  _____________________________________________________________________  External Attachment:    Type:   Image     Comment:   External Document

## 2010-03-26 NOTE — Assessment & Plan Note (Signed)
Summary: sore throat/chest congestion/?laryngitis/cjr   Vital Signs:  Patient profile:   40 year old female Weight:      195 pounds Temp:     98.9 degrees F oral Pulse rate:   94 / minute BP sitting:   100 / 74  (left arm) Cuff size:   large  Vitals Entered By: Alfred Levins, CMA (December 06, 2008 11:29 AM) CC: congestion, st, cough, bilateral ear pain, laryngitis x2 days   History of Present Illness: 5 days of stuffy head, hoarseness, ST, chest congestion, and a dry cough. No fever.   Allergies (verified): 1)  ! Penicillin  Past History:  Past Medical History: Reviewed history from 04/24/2008 and no changes required. Allergic rhinitis Anemia-iron deficiency Chickenpox Osteoarthritis, especially in right shoulder, sees Dr. Rennis Chris menorrhagia  Review of Systems  The patient denies anorexia, fever, weight loss, weight gain, vision loss, decreased hearing, hoarseness, chest pain, syncope, dyspnea on exertion, peripheral edema, hemoptysis, abdominal pain, melena, hematochezia, severe indigestion/heartburn, hematuria, incontinence, genital sores, muscle weakness, suspicious skin lesions, transient blindness, difficulty walking, depression, unusual weight change, abnormal bleeding, enlarged lymph nodes, angioedema, breast masses, and testicular masses.    Physical Exam  General:  Well-developed,well-nourished,in no acute distress; alert,appropriate and cooperative throughout examination Head:  Normocephalic and atraumatic without obvious abnormalities. No apparent alopecia or balding. Eyes:  No corneal or conjunctival inflammation noted. EOMI. Perrla. Funduscopic exam benign, without hemorrhages, exudates or papilledema. Vision grossly normal. Ears:  External ear exam shows no significant lesions or deformities.  Otoscopic examination reveals clear canals, tympanic membranes are intact bilaterally without bulging, retraction, inflammation or discharge. Hearing is grossly normal  bilaterally. Nose:  External nasal examination shows no deformity or inflammation. Nasal mucosa are pink and moist without lesions or exudates. Mouth:  Oral mucosa and oropharynx without lesions or exudates.  Teeth in good repair. Neck:  No deformities, masses, or tenderness noted. Lungs:  Normal respiratory effort, chest expands symmetrically. Lungs are clear to auscultation, no crackles or wheezes. Voice is hoarse   Impression & Recommendations:  Problem # 1:  ACUTE BRONCHITIS (ICD-466.0)  Her updated medication list for this problem includes:    Hydrocodone-homatropine 5-1.5 Mg/91ml Syrp (Hydrocodone-homatropine) .Marland Kitchen... 1 tsp q 4 hours as needed cough    Zithromax Z-pak 250 Mg Tabs (Azithromycin) .Marland Kitchen... As directed  Complete Medication List: 1)  Ferrous Sulfate 325 (65 Fe) Mg Tabs (Ferrous sulfate) .... Two times a day 2)  Ortho-novum 1/35 (28) 1-35 Mg-mcg Tabs (Norethindrone-eth estradiol) .... Once daily 3)  Allegra 60 Mg Tabs (Fexofenadine hcl) .... Two times a day as needed 4)  Flonase 50 Mcg/act Susp (Fluticasone propionate) .... 2 sprays each nostril once daily 5)  Hydrocodone-homatropine 5-1.5 Mg/51ml Syrp (Hydrocodone-homatropine) .Marland Kitchen.. 1 tsp q 4 hours as needed cough 6)  Zithromax Z-pak 250 Mg Tabs (Azithromycin) .... As directed 7)  Prednisone (pak) 10 Mg Tabs (Prednisone) .... As directed for 6 days  Patient Instructions: 1)  Please schedule a follow-up appointment as needed .  Prescriptions: PREDNISONE (PAK) 10 MG TABS (PREDNISONE) as directed for 6 days  #1 x 0   Entered and Authorized by:   Nelwyn Salisbury MD   Signed by:   Nelwyn Salisbury MD on 12/06/2008   Method used:   [facility]  [place] [address] [facility]       [address]       [place], [place]  [address]  Ph: [phone] or [phone]       Fax: [phone]   RxID:   [id number] ZITHROMAX Z-PAK 250 MG TABS (AZITHROMYCIN) as directed  #1 x 0   Entered and Authorized by:   Nelwyn Salisbury  MD   Signed by:   Nelwyn Salisbury MD on 12/06/2008   Method used:   [facility]        [facility]  [place] [address] [facility]       [address]       [place], [place]  [address]       Ph: [phone] or [phone]       Fax: [phone]   RxID:   [id number]

## 2010-03-26 NOTE — Letter (Signed)
Summary: Results Follow-up Letter  [place] [facility]  [address] [place], [place] [address]   Phone: [phone]  Fax: [phone]    02/09/2009  [address] [place], [place]  [address]  Dear Ms. Anderson Malta,   The following are the results of your recent test(s):  Test     Result     Pap Smear    Normal___X____  Not Normal_____       Comments: _________________________________________________________ Cholesterol LDL(Bad cholesterol):          Your goal is less than:         HDL (Good cholesterol):        Your goal is more than: _________________________________________________________ Other Tests:   _________________________________________________________  Please call for an appointment Or ____Repeat in one year___________________________________ _________________________________________________________ _________________________________________________________  Sincerely,  Gershon Crane, MD [place] [facility]

## 2010-03-26 NOTE — Progress Notes (Signed)
Summary: bloodwork results  Phone Note Call from Patient Call back at [phone]   Caller: Patient Call For: Nelwyn Salisbury MD Summary of Call: pt would like lab results Initial call taken by: Heron Sabins,  August 10, 2009 12:14 PM  Follow-up for Phone Call        Phone Call Completed Follow-up by: Raechel Ache, RN,  August 10, 2009 12:24 PM    New/Updated Medications: LIPITOR 40 MG TABS (ATORVASTATIN CALCIUM) 1 once daily Prescriptions: LIPITOR 40 MG TABS (ATORVASTATIN CALCIUM) 1 once daily  #30 x 11   Entered by:   Raechel Ache, RN   Authorized by:   Nelwyn Salisbury MD   Signed by:   Raechel Ache, RN on 08/10/2009   Method used:   [facility]        [facility]  [place] [address] [facility]       [address]       [place], [place]  [address]       Ph: [phone] or [phone]       Fax: [phone]   RxID:   [id number]

## 2010-03-26 NOTE — Consult Note (Signed)
Summary: [facility]   Imported By: Maryln Gottron 11/13/2009 09:19:09  _____________________________________________________________________  External Attachment:    Type:   Image     Comment:   External Document

## 2010-03-26 NOTE — Medication Information (Signed)
Summary: Prior Authorization Request and Approval for Fexofenadine Hcl/[facility]  Prior Authorization Request and Approval for Fexofenadine Hcl/[facility]   Imported By: Maryln Gottron 07/18/2008 13:29:32  _____________________________________________________________________  External Attachment:    Type:   Image     Comment:   External Document

## 2010-03-26 NOTE — Progress Notes (Signed)
Summary: WANTS  CINDY TO  CALL  Phone Note Call from Patient Call back at [phone]   Caller: Patient-LIVE CALL Summary of Call: WANTS CINDY TO RETURN CALL. HAS ? Initial call taken by: Warnell Forester,  January 22, 2009 3:55 PM  Follow-up for Phone Call        pt wanted to know numbers of her cholesterol, gave her the number and she said stated she didn't know if she could handle the diet.  I offered a referral to the nutritionist and she said she would wait till her CPX appt on the 14th and disuss it with him Follow-up by: Alfred Levins, CMA,  January 22, 2009 4:38 PM

## 2010-03-26 NOTE — Assessment & Plan Note (Signed)
Summary: ?inner ear inf/njr   Vital Signs:  Patient profile:   40 year old female Menstrual status:  regular Weight:      199 pounds Temp:     98.6 degrees F oral Pulse rate:   113 / minute BP sitting:   114 / 80  (left arm) Cuff size:   large  Vitals Entered By: Alfred Levins, CMA (March 21, 2009 2:25 PM) CC: lt earache, dizzy   History of Present Illness: Here with her mother for sudden onset today of dizziness. She has had sinus congesiton for several days , then woke up today with the room spining. This happens whenever she moves quickly. No HA or fever or nausea.   Allergies: 1)  ! Penicillin  Past History:  Past Medical History: Reviewed history from 04/24/2008 and no changes required. Allergic rhinitis Anemia-iron deficiency Chickenpox Osteoarthritis, especially in right shoulder, sees Dr. Rennis Chris menorrhagia  Past Surgical History: Caesarean section x2 Tubal ligation PE tubes  Review of Systems  The patient denies anorexia, fever, weight loss, weight gain, vision loss, decreased hearing, hoarseness, chest pain, syncope, dyspnea on exertion, peripheral edema, prolonged cough, headaches, hemoptysis, abdominal pain, melena, hematochezia, severe indigestion/heartburn, hematuria, incontinence, genital sores, muscle weakness, suspicious skin lesions, transient blindness, difficulty walking, depression, unusual weight change, abnormal bleeding, enlarged lymph nodes, angioedema, breast masses, and testicular masses.    Physical Exam  General:  Well-developed,well-nourished,in no acute distress; alert,appropriate and cooperative throughout examination Head:  Normocephalic and atraumatic without obvious abnormalities. No apparent alopecia or balding. Eyes:  No corneal or conjunctival inflammation noted. EOMI. Perrla. Funduscopic exam benign, without hemorrhages, exudates or papilledema. Vision grossly normal. Ears:  External ear exam shows no significant lesions or  deformities.  Otoscopic examination reveals clear canals, tympanic membranes are intact bilaterally without bulging, retraction, inflammation or discharge. Hearing is grossly normal bilaterally. Nose:  External nasal examination shows no deformity or inflammation. Nasal mucosa are pink and moist without lesions or exudates. Mouth:  Oral mucosa and oropharynx without lesions or exudates.  Teeth in good repair. Neck:  No deformities, masses, or tenderness noted. Lungs:  Normal respiratory effort, chest expands symmetrically. Lungs are clear to auscultation, no crackles or wheezes. Neurologic:  No cranial nerve deficits noted. Station and gait are normal. Plantar reflexes are down-going bilaterally. DTRs are symmetrical throughout. Sensory, motor and coordinative functions appear intact.   Impression & Recommendations:  Problem # 1:  BENIGN POSITIONAL VERTIGO (ICD-386.11)  Her updated medication list for this problem includes:    Allegra 180 Mg Tabs (Fexofenadine hcl) ..... Once daily    Meclizine Hcl 25 Mg Tabs (Meclizine hcl) .Marland Kitchen... 1 q 4 hours as needed dizziness  Problem # 2:  VIRAL URI (ICD-465.9)  Her updated medication list for this problem includes:    Allegra 180 Mg Tabs (Fexofenadine hcl) ..... Once daily  Complete Medication List: 1)  Ortho-novum 1/35 (28) 1-35 Mg-mcg Tabs (Norethindrone-eth estradiol) .... Once daily 2)  Flonase 50 Mcg/act Susp (Fluticasone propionate) .... 2 sprays each nostril once daily 3)  Allegra 180 Mg Tabs (Fexofenadine hcl) .... Once daily 4)  Meclizine Hcl 25 Mg Tabs (Meclizine hcl) .Marland Kitchen.. 1 q 4 hours as needed dizziness  Patient Instructions: 1)  Rest, fluids. Off work today. Prescriptions: MECLIZINE HCL 25 MG TABS (MECLIZINE HCL) 1 q 4 hours as needed dizziness  #60 x 2   Entered and Authorized by:   Nelwyn Salisbury MD   Signed by:   Nelwyn Salisbury MD on  03/21/2009   Method used:   [facility]        [facility]  [place] [address] [facility]       [address]       [place], [place]  [address]       Ph: [phone] or [phone]       Fax: [phone]   RxID:   [id number]

## 2010-03-26 NOTE — Medication Information (Signed)
Summary: Prior Authorization Request-Fexofenadine/[facility]  Prior Authorization Request-Fexofenadine/[facility]   Imported By: Maryln Gottron 02/20/2009 09:34:39  _____________________________________________________________________  External Attachment:    Type:   Image     Comment:   External Document

## 2010-03-26 NOTE — Assessment & Plan Note (Signed)
Summary: ? allergies//ccm   Vital Signs:  Patient profile:   40 year old female Weight:      198 pounds BMI:     38.81 Temp:     97. degrees F Pulse rate:   88 / minute BP sitting:   106 / 84  (left arm) Cuff size:   large  Vitals Entered By: Alfred Levins, CMA (Jun 28, 2008 3:26 PM) CC: cough, congestion x2 days   History of Present Illness: For 5 days has had stufy head, HA, ST, and a dry cough. No fever. Saw a [facility] who diagnosed allergies. Gave her Flonase, Care[employer], and [employer]. These help somewhat, but now the cough is deeper and her chest burns.   Allergies (verified): No Known Drug Allergies  Past History:  Past Medical History:    Reviewed history from 04/24/2008 and no changes required:    Allergic rhinitis    Anemia-iron deficiency    Chickenpox    Osteoarthritis, especially in right shoulder, sees Dr. Rennis Chris    menorrhagia  Review of Systems  The patient denies anorexia, fever, weight loss, weight gain, vision loss, decreased hearing, hoarseness, chest pain, syncope, peripheral edema, hemoptysis, abdominal pain, melena, hematochezia, severe indigestion/heartburn, hematuria, incontinence, genital sores, muscle weakness, suspicious skin lesions, transient blindness, difficulty walking, depression, unusual weight change, abnormal bleeding, enlarged lymph nodes, angioedema, breast masses, and testicular masses.    Physical Exam  General:  Well-developed,well-nourished,in no acute distress; alert,appropriate and cooperative throughout examination Head:  Normocephalic and atraumatic without obvious abnormalities. No apparent alopecia or balding. Eyes:  No corneal or conjunctival inflammation noted. EOMI. Perrla. Funduscopic exam benign, without hemorrhages, exudates or papilledema. Vision grossly normal. Ears:  External ear exam shows no significant lesions or deformities.  Otoscopic examination reveals clear canals, tympanic membranes are intact  bilaterally without bulging, retraction, inflammation or discharge. Hearing is grossly normal bilaterally. Nose:  External nasal examination shows no deformity or inflammation. Nasal mucosa are pink and moist without lesions or exudates. Mouth:  Oral mucosa and oropharynx without lesions or exudates.  Teeth in good repair. Neck:  No deformities, masses, or tenderness noted. Lungs:  scattered rhonchi   Impression & Recommendations:  Problem # 1:  ACUTE BRONCHITIS (ICD-466.0)  Her updated medication list for this problem includes:    Zithromax Z-pak 250 Mg Tabs (Azithromycin) .Marland Kitchen... As directed    Hydrocodone-homatropine 5-1.5 Mg/12ml Syrp (Hydrocodone-homatropine) .Marland Kitchen... 1 tsp q 4 hours as needed cough  Problem # 2:  ALLERGIC RHINITIS (ICD-477.9)  Her updated medication list for this problem includes:    Allegra 60 Mg Tabs (Fexofenadine hcl) .Marland Kitchen..Marland Kitchen Two times a day as needed    Flonase 50 Mcg/act Susp (Fluticasone propionate) .Marland Kitchen... 2 sprays each nostril once daily  Complete Medication List: 1)  Ferrous Sulfate 325 (65 Fe) Mg Tabs (Ferrous sulfate) .... Two times a day 2)  Ortho-novum 1/35 (28) 1-35 Mg-mcg Tabs (Norethindrone-eth estradiol) .... Once daily 3)  Allegra 60 Mg Tabs (Fexofenadine hcl) .... Two times a day as needed 4)  Flonase 50 Mcg/act Susp (Fluticasone propionate) .... 2 sprays each nostril once daily 5)  Zithromax Z-pak 250 Mg Tabs (Azithromycin) .... As directed 6)  Hydrocodone-homatropine 5-1.5 Mg/66ml Syrp (Hydrocodone-homatropine) .Marland Kitchen.. 1 tsp q 4 hours as needed cough  Patient Instructions: 1)  Please schedule a follow-up appointment as needed .  Prescriptions: HYDROCODONE-HOMATROPINE 5-1.5 MG/5ML SYRP (HYDROCODONE-HOMATROPINE) 1 tsp q 4 hours as needed cough  #240 ml x 0   Entered and Authorized  by:   Nelwyn Salisbury MD   Signed by:   Nelwyn Salisbury MD on 06/28/2008   Method used:   Print then Give to Patient   RxID:   [id number] ZITHROMAX Z-PAK 250 MG TABS  (AZITHROMYCIN) as directed  #1 x 0   Entered and Authorized by:   Nelwyn Salisbury MD   Signed by:   Nelwyn Salisbury MD on 06/28/2008   Method used:   Print then Give to Patient   RxID:   [id number] FLONASE 50 MCG/ACT SUSP (FLUTICASONE PROPIONATE) 2 sprays each nostril once daily  #30 days x 11   Entered and Authorized by:   Nelwyn Salisbury MD   Signed by:   Nelwyn Salisbury MD on 06/28/2008   Method used:   Print then Give to Patient   RxID:   [id number] ALLEGRA 60 MG TABS (FEXOFENADINE HCL) two times a day as needed  #60 x 11   Entered and Authorized by:   Nelwyn Salisbury MD   Signed by:   Nelwyn Salisbury MD on 06/28/2008   Method used:   Print then Give to Patient   RxID:   [id number]

## 2010-03-26 NOTE — Assessment & Plan Note (Signed)
Summary: SINUSES//CCM   Vital Signs:  Patient profile:   40 year old female Menstrual status:  regular O2 Sat:      98 % Temp:     98.5 degrees F Pulse rate:   108 / minute BP sitting:   132 / 78  (left arm)  Vitals Entered By: Pura Spice, RN (December 11, 2009 11:13 AM) CC: sinus inf since sunday    History of Present Illness: Here for one week of sinus pressure, HA, ST, and a dry cough. No fever. Using Mucinex D.   Allergies: 1)  ! Penicillin  Past History:  Past Medical History: Reviewed history from 11/23/2009 and no changes required. Allergic rhinitis Anemia-iron deficiency Chickenpox Osteoarthritis, especially in right shoulder, sees Dr. Rennis Chris menorrhagia microscopic hemsturia, benign, worked up by Dr. 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    Physical Exam  General:  Well-developed,well-nourished,in no acute distress; alert,appropriate and cooperative throughout examination Head:  Normocephalic and atraumatic without obvious abnormalities. No apparent alopecia or balding. Eyes:  No corneal or conjunctival inflammation noted. EOMI. Perrla. Funduscopic exam benign, without hemorrhages, exudates or papilledema. Vision grossly normal. Ears:  External ear exam shows no significant lesions or deformities.  Otoscopic examination reveals clear canals, tympanic membranes are intact bilaterally without bulging, retraction, inflammation or discharge.  Hearing is grossly normal bilaterally. Nose:  External nasal examination shows no deformity or inflammation. Nasal mucosa are pink and moist without lesions or exudates. Mouth:  Oral mucosa and oropharynx without lesions or exudates.  Teeth in good repair. Neck:  No deformities, masses, or tenderness noted. Lungs:  Normal respiratory effort, chest expands symmetrically. Lungs are clear to auscultation, no crackles or wheezes.   Impression & Recommendations:  Problem # 1:  ACUTE SINUSITIS, UNSPECIFIED (ICD-461.9)  Her updated medication list for this problem includes:    Flonase 50 Mcg/act Susp (Fluticasone propionate) .Marland Kitchen... 2 sprays each nostril once daily    Zithromax Z-pak 250 Mg Tabs (Azithromycin) .Marland Kitchen... As directed  Orders: Depo- Medrol 80mg  (J1040) Depo- Medrol 40mg  (J1030) Admin of Therapeutic Inj  intramuscular or subcutaneous (34742)  Complete Medication List: 1)  Ortho-novum 1/35 (28) 1-35 Mg-mcg Tabs (Norethindrone-eth estradiol) .... Once daily 2)  Flonase 50 Mcg/act Susp (Fluticasone propionate) .... 2 sprays each nostril once daily 3)  Robaxin-750 750 Mg Tabs (Methocarbamol) .Marland Kitchen.. 1 q 6 hours as needed spasm 4)  Vicodin 5-500 Mg Tabs (Hydrocodone-acetaminophen) .Marland Kitchen.. 1 q 4 hours as needed pain 5)  Xyzal 5 Mg Tabs (Levocetirizine dihydrochloride) .Marland Kitchen.. 1 once daily 6)  Lipitor 40 Mg Tabs (Atorvastatin calcium) .Marland Kitchen.. 1 once daily 7)  Zithromax Z-pak 250 Mg Tabs (Azithromycin) .... As directed  Patient Instructions: 1)  Please schedule a follow-up appointment as needed .  Prescriptions: ZITHROMAX Z-PAK 250 MG TABS (AZITHROMYCIN) as directed  #1 x 0   Entered and Authorized by:   Nelwyn Salisbury MD   Signed by:   Nelwyn Salisbury MD on 12/11/2009   Method used:   [facility]        [facility]  [place] [address]  [address]       [place], [place]  [address]       Ph: [phone] or [phone]       Fax: [phone]   RxID:   [id number]    Medication  Administration  Injection # 1:    Medication: Depo- Medrol 80mg     Diagnosis: ACUTE SINUSITIS, UNSPECIFIED (ICD-461.9)    Route: IM    Site: RUOQ gluteus    Exp Date: 07/2012    Lot #: [id number]    Mfr: [employer]    Patient tolerated injection without complications    Given by: Pura Spice, RN (December 11, 2009 11:50 AM)  Injection # 2:    Medication: Depo- Medrol 40mg     Diagnosis: ACUTE SINUSITIS, UNSPECIFIED (ICD-461.9)    Route: IM    Site: RUOQ gluteus    Exp Date: 07/2012    Lot #: [id number]    Mfr: [employer]    Patient tolerated injection without complications    Given by: Pura Spice, RN (December 11, 2009 11:51 AM)  Orders Added: 1)  Est. Patient Level IV [66063] 2)  Depo- Medrol 80mg  [J1040] 3)  Depo- Medrol 40mg  [J1030] 4)  Admin of Therapeutic Inj  intramuscular or subcutaneous [01601]

## 2010-03-26 NOTE — Progress Notes (Signed)
Summary: repeat bloodwork  Phone Note Call from Patient Call back at [phone]   Caller: Patient Call For: dr Antonie Borjon Summary of Call: pt is anemic she was sch for shoulder surgery which was postponed due to her condition, pt would like to know when should she have a recheck on her blood, ptis on iron pills and mvi Initial call taken by: Heron Sabins,  March 03, 2008 12:42 PM  Follow-up for Phone Call        I had planned on checking labs again in 3 months, which would be early March Follow-up by: Nelwyn Salisbury MD,  March 03, 2008 3:07 PM  Additional Follow-up for Phone Call Additional follow up Details #1::        Pt. notified of Dr. Claris Che recommendations. Will call for appt. Additional Follow-up by: Lynann Beaver CMA,  March 03, 2008 3:19 PM

## 2010-03-26 NOTE — Assessment & Plan Note (Signed)
Summary: FOLLOW UP ON ANEMIA/MHF   Vital Signs:  Patient Profile:   40 Years Old Female Height:     60 inches Weight:      185 pounds Temp:     98.2 degrees F oral Pulse rate:   93 / minute BP sitting:   128 / 82  (left arm) Cuff size:   regular  Vitals Entered By: Alfred Levins, CMA (April 24, 2008 8:47 AM)                 Chief Complaint:  f/u and labs.  History of Present Illness: Here to follow up menorrhagia and anemia. Since starting the ON 1/35 she has felt much better, her periods are regular and much lighter. She tolerates the iron well, feels stronger.     Current Allergies: No known allergies   Past Medical History:    Reviewed history from 01/27/2008 and no changes required:       Allergic rhinitis       Anemia-iron deficiency       Chickenpox       Osteoarthritis, especially in right shoulder, sees Dr. Rennis Chris       menorrhagia            Review of Systems  The patient denies anorexia, fever, weight loss, weight gain, vision loss, decreased hearing, hoarseness, chest pain, syncope, dyspnea on exertion, peripheral edema, prolonged cough, headaches, hemoptysis, abdominal pain, melena, hematochezia, severe indigestion/heartburn, hematuria, incontinence, genital sores, muscle weakness, suspicious skin lesions, transient blindness, difficulty walking, depression, unusual weight change, abnormal bleeding, enlarged lymph nodes, angioedema, breast masses, and testicular masses.     Physical Exam  General:     Well-developed,well-nourished,in no acute distress; alert,appropriate and cooperative throughout examination Lungs:     Normal respiratory effort, chest expands symmetrically. Lungs are clear to auscultation, no crackles or wheezes. Heart:     Normal rate and regular rhythm. S1 and S2 normal without gallop, murmur, click, rub or other extra sounds.    Impression & Recommendations:  Problem # 1:  MENORRHAGIA (ICD-626.2)  Her updated medication  list for this problem includes:    Ortho-novum 1/35 (28) 1-35 Mg-mcg Tabs (Norethindrone-eth estradiol) ..... Once daily   Problem # 2:  ANEMIA-IRON DEFICIENCY (ICD-280.9)  Her updated medication list for this problem includes:    Ferrous Sulfate 325 (65 Fe) Mg Tabs (Ferrous sulfate) .Marland Kitchen..Marland Kitchen Two times a day  Orders: Venipuncture (11914) TLB-CBC Platelet - w/Differential (85025-CBCD) TLB-Ferritin (82728-FER)   Complete Medication List: 1)  Ferrous Sulfate 325 (65 Fe) Mg Tabs (Ferrous sulfate) .... Two times a day 2)  Ortho-novum 1/35 (28) 1-35 Mg-mcg Tabs (Norethindrone-eth estradiol) .... Once daily   Patient Instructions: 1)  Check labs today.

## 2010-03-26 NOTE — Assessment & Plan Note (Signed)
Summary: UTI/dm   Vital Signs:  Patient profile:   40 year old female Menstrual status:  regular Weight:      201 pounds O2 Sat:      99 % Temp:     97.9 degrees F Pulse rate:   90 / minute BP sitting:   120 / 80  (left arm) Cuff size:   large  Vitals Entered By: Pura Spice, RN (November 23, 2009 10:43 AM) CC: burning frequency ? UTI     History of Present Illness: Here for 3 days of burning and urgency to urinate. No fever.   Allergies: 1)  ! Penicillin  Past History:  Past Medical History: Allergic rhinitis Anemia-iron deficiency Chickenpox Osteoarthritis, especially in right shoulder, sees Dr. Rennis Chris menorrhagia microscopic hemsturia, benign, worked up by Dr. 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 Caesarean section x2 Tubal ligation PE tubes  Review of Systems  The patient denies anorexia, fever, weight loss, weight gain, vision loss, decreased hearing, hoarseness, chest pain, syncope, dyspnea on exertion, peripheral edema, prolonged cough, headaches, hemoptysis, abdominal pain, melena, hematochezia, severe indigestion/heartburn, hematuria, incontinence, genital sores, muscle weakness, suspicious skin lesions, transient blindness, difficulty walking, depression, unusual weight change, abnormal bleeding, enlarged lymph nodes, angioedema, breast masses, and testicular masses.    Physical Exam  General:  Well-developed,well-nourished,in no acute distress; alert,appropriate and cooperative throughout examination Abdomen:  Bowel sounds positive,abdomen soft and non-tender without masses, organomegaly or hernias noted.   Impression & Recommendations:  Problem # 1:  UTI (ICD-599.0)  Her updated medication list for this problem includes:    Ciprofloxacin Hcl 500 Mg Tabs (Ciprofloxacin hcl) .Marland Kitchen..Marland Kitchen Two times a day  Orders: UA Dipstick w/o Micro (manual)  (81002) T-Culture, Urine (47829-56213)  Complete Medication List: 1)  Ortho-novum 1/35 (28) 1-35 Mg-mcg Tabs (Norethindrone-eth estradiol) .... Once daily 2)  Flonase 50 Mcg/act Susp (Fluticasone propionate) .... 2 sprays each nostril once daily 3)  Robaxin-750 750 Mg Tabs (Methocarbamol) .Marland Kitchen.. 1 q 6 hours as needed spasm 4)  Vicodin 5-500 Mg Tabs (Hydrocodone-acetaminophen) .Marland Kitchen.. 1 q 4 hours as needed pain 5)  Xyzal 5 Mg Tabs (Levocetirizine dihydrochloride) .Marland Kitchen.. 1 once daily 6)  Lipitor 40 Mg Tabs (Atorvastatin calcium) .Marland Kitchen.. 1 once daily 7)  Ciprofloxacin Hcl 500 Mg Tabs (Ciprofloxacin hcl) .... Two times a day  Patient Instructions: 1)  Drink plenty of lfuids.  2)  Please schedule a follow-up appointment as needed .  Prescriptions: CIPROFLOXACIN HCL 500 MG TABS (CIPROFLOXACIN HCL) two times a day  #14 x 0   Entered and Authorized by:   Nelwyn Salisbury MD   Signed by:   Nelwyn Salisbury MD on 11/23/2009   Method used:   [facility]        [facility]  [place] [address] [facility]       [address]       [place], [place]  [address]       Ph: [phone] or [phone]       Fax: [phone]   RxID:   [id number]   Laboratory Results   Urine Tests  Date/Time Received: November 23, 2009  Date/Time Reported: 10:56 AM   Routine Urinalysis   Color: yellow Appearance: Clear Glucose: negative   (Normal Range: Negative) Bilirubin: negative   (Normal Range: Negative) Ketone: negative   (Normal Range: Negative) Blood: small   (Normal Range: Negative) pH: 7.5   (Normal  Range: 5.0-8.0) Protein: 100   (Normal Range: Negative) Urobilinogen: 0.2   (Normal Range: 0-1) Nitrite: negative   (Normal Range: Negative) Leukocyte Esterace: small   (Normal Range: Negative)    Comments: Pura Spice, RN  November 23, 2009 10:58 AM

## 2010-03-26 NOTE — Letter (Signed)
Summary: Results Follow-up Letter  [place] [facility]  [address] [place], [place] [address]   Phone: [phone]  Fax: [phone]    02/08/2008  [address] [place], [place]  [address]  Dear Ms. Anderson Malta,   The following are the results of your recent test(s):  Test     Result     Pap Smear    Normal___X____  Not Normal_____       Comments: _________________________________________________________ Cholesterol LDL(Bad cholesterol):          Your goal is less than:         HDL (Good cholesterol):        Your goal is more than: _________________________________________________________ Other Tests:   _________________________________________________________  Please call for an appointment Or __________Repeat in 1 year_______________________________ _________________________________________________________ _________________________________________________________  Sincerely,  Alfred Levins, CMA [place] [facility]

## 2010-03-26 NOTE — Letter (Signed)
Summary: [facility]   Imported By: Maryln Gottron 02/03/2008 13:44:28  _____________________________________________________________________  External Attachment:    Type:   Image     Comment:   External Document

## 2010-03-26 NOTE — Assessment & Plan Note (Signed)
Summary: referral from Dr Rennis Chris   Vital Signs:  Patient Profile:   40 Years Old Female Height:     60 inches Weight:      176 pounds Temp:     98.8 degrees F oral Pulse rate:   118 / minute BP sitting:   100 / 64  (left arm) Cuff size:   large  Vitals Entered By: Alfred Levins, CMA (January 27, 2008 11:04 AM)                 Chief Complaint:  establish.  History of Present Illness: 40 yr old female to establish with [place], since she was refewrred to [place] from Dr. Rennis Chris. She is scheduled for surgery to remove bone spurs from the right shoulder on 02-15-08. However a preoperative MRI scan of the shoulder showed a decreased marrow signal that indicated possible anemia. Dr. Rennis Chris confirmed this with a blood test, and sent  her to see [place]. She does not have a hx of anemia, but she does have very heavy periods. Ever since the birth of her third  child 7 years ago, her periods are regular but are heavy for about 5 days. She stopped taking BCP 7 years ago, and they have been heavy ever since. She craves ice frequently, but otherwise feels fine. Very active, plays softball, etc. LMP was last week. She does not take vitamins or supplements. She is not a vegetarian.     Current Allergies (reviewed today): No known allergies   Past Medical History:    Reviewed history and no changes required:       Allergic rhinitis       Anemia-iron deficiency       Chickenpox       Osteoarthritis, especially in right shoulder, sees Dr. Rennis Chris         Past Surgical History:    Reviewed history and no changes required:       Caesarean section x2   Family History:    Reviewed history and no changes required:       Family History of Arthritis       Family History of CAD Female 1st degree relative <50       Family History Depression       Family History Diabetes 1st degree relative       Family History High cholesterol       Family History of Sudden Death  Social History:    Reviewed history and no  changes required:       Divorced       Never Smoked       Alcohol use-no       Regular exercise-no   Risk Factors:  Tobacco use:  never Alcohol use:  no Exercise:  no   Review of Systems  The patient denies anorexia, fever, weight loss, weight gain, vision loss, decreased hearing, hoarseness, chest pain, syncope, dyspnea on exertion, peripheral edema, prolonged cough, headaches, hemoptysis, abdominal pain, melena, hematochezia, severe indigestion/heartburn, hematuria, incontinence, genital sores, muscle weakness, suspicious skin lesions, transient blindness, difficulty walking, depression, unusual weight change, abnormal bleeding, enlarged lymph nodes, angioedema, breast masses, and testicular masses.     Physical Exam  General:     overweight-appearing.   Neck:     No deformities, masses, or tenderness noted. Lungs:     Normal respiratory effort, chest expands symmetrically. Lungs are clear to auscultation, no crackles or wheezes. Heart:     Normal rate and regular rhythm.  S1 and S2 normal without gallop, murmur, click, rub or other extra sounds. Abdomen:     Bowel sounds positive,abdomen soft and non-tender without masses, organomegaly or hernias noted.    Impression & Recommendations:  Problem # 1:  ANEMIA-IRON DEFICIENCY (ICD-280.9)  Orders: Venipuncture (60454) TLB-BMP (Basic Metabolic Panel-BMET) (80048-METABOL) TLB-CBC Platelet - w/Differential (85025-CBCD) TLB-Hepatic/Liver Function Pnl (80076-HEPATIC) TLB-TSH (Thyroid Stimulating Hormone) (84443-TSH) TLB-Iron, (Fe) Total (83540-FE) TLB-B12, Serum-Total ONLY (09811-B14) TLB-Ferritin (82728-FER) TLB-Folic Acid (Folate) (82746-FOL)  Her updated medication list for this problem includes:    Ferrous Sulfate 325 (65 Fe) Mg Tabs (Ferrous sulfate) .Marland Kitchen..Marland Kitchen Two times a day   Problem # 2:  OSTEOARTHRITIS (ICD-715.90)  Complete Medication List: 1)  Ferrous Sulfate 325 (65 Fe) Mg Tabs (Ferrous sulfate) .... Two times  a day   Patient Instructions: 1)  Will work up anemia today with labs. Start an OTC multivitamin and iron pills two times a day . depending on our results, we will see if her surgery needs to be postponed or not   Prescriptions: FERROUS SULFATE 325 (65 FE) MG TABS (FERROUS SULFATE) two times a day  #60 x 11   Entered and Authorized by:   Nelwyn Salisbury MD   Signed by:   Nelwyn Salisbury MD on 01/27/2008   Method used:   Print then Give to Patient   RxID:   [id number]  ]

## 2010-03-26 NOTE — Assessment & Plan Note (Signed)
Summary: sinuses//ccm   Vital Signs:  Patient profile:   40 year old female Menstrual status:  regular Weight:      198 pounds Temp:     98.3 degrees F oral BP sitting:   120 / 80  (right arm) Cuff size:   regular  Vitals Entered By: Duard Brady LPN (June 01, 1608 10:55 AM) CC: c/o head congestion, non-productive cough, (L) ear 'full' feeling Is Patient Diabetic? No   History of Present Illness: Here for 4 days of sinus pressure, HA, ear pressure, ST, and a dry cough. No fever.   Preventive Screening-Counseling & Management  Alcohol-Tobacco     Smoking Status: never  Allergies: 1)  ! Penicillin  Past History:  Past Medical History: Reviewed history from 04/24/2008 and no changes required. Allergic rhinitis Anemia-iron deficiency Chickenpox Osteoarthritis, especially in right shoulder, sees Dr. Rennis Chris menorrhagia  Review of Systems  The patient denies anorexia, fever, weight loss, weight gain, vision loss, decreased hearing, hoarseness, chest pain, syncope, dyspnea on exertion, peripheral edema, hemoptysis, abdominal pain, melena, hematochezia, severe indigestion/heartburn, hematuria, incontinence, genital sores, muscle weakness, suspicious skin lesions, transient blindness, difficulty walking, depression, unusual weight change, abnormal bleeding, enlarged lymph nodes, angioedema, breast masses, and testicular masses.    Physical Exam  General:  Well-developed,well-nourished,in no acute distress; alert,appropriate and cooperative throughout examination Head:  Normocephalic and atraumatic without obvious abnormalities. No apparent alopecia or balding. Eyes:  No corneal or conjunctival inflammation noted. EOMI. Perrla. Funduscopic exam benign, without hemorrhages, exudates or papilledema. Vision grossly normal. Ears:  External ear exam shows no significant lesions or deformities.  Otoscopic examination reveals clear canals, tympanic membranes are intact bilaterally  without bulging, retraction, inflammation or discharge. Hearing is grossly normal bilaterally. Nose:  External nasal examination shows no deformity or inflammation. Nasal mucosa are pink and moist without lesions or exudates. Mouth:  Oral mucosa and oropharynx without lesions or exudates.  Teeth in good repair. Neck:  No deformities, masses, or tenderness noted. Lungs:  Normal respiratory effort, chest expands symmetrically. Lungs are clear to auscultation, no crackles or wheezes.   Impression & Recommendations:  Problem # 1:  ACUTE SINUSITIS, UNSPECIFIED (ICD-461.9)  Her updated medication list for this problem includes:    Flonase 50 Mcg/act Susp (Fluticasone propionate) .Marland Kitchen... 2 sprays each nostril once daily    Zithromax Z-pak 250 Mg Tabs (Azithromycin) .Marland Kitchen... As directed  Complete Medication List: 1)  Ortho-novum 1/35 (28) 1-35 Mg-mcg Tabs (Norethindrone-eth estradiol) .... Once daily 2)  Flonase 50 Mcg/act Susp (Fluticasone propionate) .... 2 sprays each nostril once daily 3)  Allegra 180 Mg Tabs (Fexofenadine hcl) .... Once daily 4)  Simvastatin 40 Mg Tabs (Simvastatin) .... Take one tablet at bedtime 5)  Zithromax Z-pak 250 Mg Tabs (Azithromycin) .... As directed  Patient Instructions: 1)  Please schedule a follow-up appointment as needed .  Prescriptions: ZITHROMAX Z-PAK 250 MG TABS (AZITHROMYCIN) as directed  #1 x 0   Entered and Authorized by:   Nelwyn Salisbury MD   Signed by:   Nelwyn Salisbury MD on 06/01/2009   Method used:   [facility]        [facility]  [place] [address] [facility]       [address]       [place], [place]  [address]       Ph: [phone] or [phone]       Fax: [phone]   RxID:   [id number]

## 2010-03-28 NOTE — Assessment & Plan Note (Signed)
Summary: cough/congestion/earache/ok per gina/cjr   Vital Signs:  Patient profile:   40 year old female Menstrual status:  regular Height:      59.5 inches Weight:      202 pounds O2 Sat:      98 % on Room air Temp:     98.3 degrees F oral Pulse rate:   82 / minute BP sitting:   122 / 86  (left arm)  Vitals Entered By: Jeremy Johann CMA (February 28, 2010 11:21 AM)  O2 Flow:  Room air CC: cough, head and chest congestion, pain in ear, drainage x6day   History of Present Illness: Here for one week of sinus pressure, PND, HA, and ST. No fever or cough. On Sudafed.   Allergies: 1)  ! Penicillin  Past History:  Past Medical History: Reviewed history from 11/23/2009 and no changes required. Allergic rhinitis Anemia-iron deficiency Chickenpox Osteoarthritis, especially in right shoulder, sees Dr. Rennis Chris menorrhagia microscopic hemsturia, benign, worked up by Dr. 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    Physical Exam  General:  Well-developed,well-nourished,in no acute distress; alert,appropriate and cooperative throughout examination Head:  Normocephalic and atraumatic without obvious abnormalities. No apparent alopecia or balding. Eyes:  No corneal or conjunctival inflammation noted. EOMI. Perrla. Funduscopic exam benign, without hemorrhages, exudates or papilledema. Vision grossly normal. Ears:  External ear exam shows no significant  lesions or deformities.  Otoscopic examination reveals clear canals, tympanic membranes are intact bilaterally without bulging, retraction, inflammation or discharge. Hearing is grossly normal bilaterally. Nose:  External nasal examination shows no deformity or inflammation. Nasal mucosa are pink and moist without lesions or exudates. Mouth:  Oral mucosa and oropharynx without lesions or exudates.  Teeth in good repair. Neck:  No deformities, masses, or tenderness noted. Lungs:  Normal respiratory effort, chest expands symmetrically. Lungs are clear to auscultation, no crackles or wheezes.   Impression & Recommendations:  Problem # 1:  ACUTE SINUSITIS, UNSPECIFIED (ICD-461.9)  Her updated medication list for this problem includes:    Flonase 50 Mcg/act Susp (Fluticasone propionate) .Marland Kitchen... 2 sprays each nostril once daily    Zithromax Z-pak 250 Mg Tabs (Azithromycin) .Marland Kitchen... As directed  Orders: Admin of Therapeutic Inj  intramuscular or subcutaneous ([address]109) Depo- Medrol 80mg  (J1040)  Complete Medication List: 1)  Flonase 50 Mcg/act Susp (Fluticasone propionate) .... 2 sprays each nostril once daily 2)  Robaxin-750 750 Mg Tabs (Methocarbamol) .Marland Kitchen.. 1 q 6 hours as needed spasm 3)  Vicodin 5-500 Mg Tabs (Hydrocodone-acetaminophen) .Marland Kitchen.. 1 q 4 hours as needed pain 4)  Allegra Allergy 180 Mg Tabs (Fexofenadine hcl) .... Take 1 tab once daily 5)  Lipitor 40 Mg Tabs (Atorvastatin calcium) .Marland Kitchen.. 1 once daily 6)  Necon 1/35 (28) 1-35 Mg-mcg Tabs (Norethindrone-eth estradiol) .Marland Kitchen.. 1 by mouth once daily  needs office visit 7)  Zithromax Z-pak 250 Mg Tabs (Azithromycin) .... As directed  Patient Instructions: 1)  Please schedule a follow-up appointment as needed .  Prescriptions: ZITHROMAX Z-PAK 250 MG TABS (AZITHROMYCIN) as directed  #1 x 0   Entered and Authorized by:   Nelwyn Salisbury MD  Signed by:   Nelwyn Salisbury MD on 02/28/2010   Method used:   [facility]        [facility]  [place] [address]       [address]       [place], [place]  [address]       Ph: [phone] or [phone]       Fax: [phone]   RxID:   [id number]    Medication Administration  Injection # 1:    Medication: Depo- Medrol 80mg     Diagnosis: ACUTE SINUSITIS, UNSPECIFIED (ICD-461.9)    Route: IM    Site: LUOQ gluteus    Exp Date: 08/24/2012    Lot #: [id number]    Mfr: Pharm[employer]    Comments: pt given 120mg      Patient tolerated injection without complications    Given by: Jeremy Johann CMA (February 28, 2010 12:17 PM)  Orders Added: 1)  Est. Patient Level IV [64403] 2)  Admin of Therapeutic Inj  intramuscular or subcutaneous [96372] 3)  Depo- Medrol 80mg  [J1040]

## 2010-03-28 NOTE — Progress Notes (Signed)
Summary: Pt req med for cough,congestion earache. Or work in [employer] from Patient Call back at [phone] cell   Caller: Patient Summary of Call: Pt called and has cough,chest and head congestion and earache. Pt req med [facility] or a work in appt for late in the afternoon today or late tomorrow. Pls notify pt if a med has been called in.  Initial call taken by: Lucy Antigua,  February 27, 2010 9:34 AM  Follow-up for Phone Call        I rcvd a flag from [facility], that said, pls sch Zoe [name] ov tomorrow thanks, so I called pt and sch her an ov for 02/28/10 at 11:15am per Almira Coaster.  Follow-up by: Lucy Antigua,  February 27, 2010 2:46 PM

## 2010-03-28 NOTE — Letter (Signed)
Summary: [facility]   Imported By: Maryln Gottron 02/21/2010 10:48:24  _____________________________________________________________________  External Attachment:    Type:   Image     Comment:   External Document

## 2010-03-28 NOTE — Letter (Signed)
Summary: Results Follow-up Letter  [place] [facility]  [address] [place], [place] [address]   Phone: [phone]  Fax: [phone]    03/22/2010  [address] [place], [place]  [address]  Dear Ms. Anderson Malta,   The following are the results of your recent test(s):  Test     Result     Pap Smear    Normal___YES REPEAT 1 YEAR____    _________________________________________________________  Sincerely,    Dr. Claris Che, MD    [place] [facility]

## 2010-03-28 NOTE — Assessment & Plan Note (Signed)
Summary: CPX W/ PAP // RS pt rsc /njr   Vital Signs:  Patient profile:   40 year old female Menstrual status:  regular Height:      59 inches Weight:      201 pounds BMI:     40.74 O2 Sat:      98 % Pulse rate:   81 / minute BP sitting:   130 / 88  (left arm) Cuff size:   large  Vitals Entered By: Pura Spice, RN (March 18, 2010 3:01 PM) CC: cpx with pap no complaints  refills    History of Present Illness: 40 yr old female for a cpx. She feels well except for 6 months of mildly painful knots on the right thumb. These do not seem to be growing.   Allergies: 1)  ! Penicillin  Past History:  Past Medical History: Reviewed history from 11/23/2009 and no changes required. Allergic rhinitis Anemia-iron deficiency Chickenpox Osteoarthritis, especially in right shoulder, sees Dr. Rennis Chris menorrhagia microscopic hemsturia, benign, worked up by Dr. 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 Caesarean section x2 Tubal ligation PE tubes  Family History: Reviewed history from 01/27/2008 and no changes required. Family History of Arthritis Family History of CAD Female 1st degree relative <50 Family History Depression Family History Diabetes 1st degree relative Family History High cholesterol Family History of Sudden Death  Social History: Reviewed history from 01/27/2008 and no changes required. Divorced Never Smoked Alcohol use-no Regular exercise-no  Review of Systems  The patient denies anorexia, fever, weight loss, vision loss, decreased hearing, hoarseness, chest pain, syncope, dyspnea on exertion, peripheral edema, prolonged cough, headaches, hemoptysis, abdominal pain, melena, hematochezia, severe indigestion/heartburn, hematuria, incontinence, genital sores, muscle weakness, suspicious skin lesions, transient blindness, difficulty walking, depression, unusual weight  change, abnormal bleeding, enlarged lymph nodes, angioedema, breast masses, and testicular masses.    Physical Exam  General:  overweight-appearing.   Head:  Normocephalic and atraumatic without obvious abnormalities. No apparent alopecia or balding. Eyes:  No corneal or conjunctival inflammation noted. EOMI. Perrla. Funduscopic exam benign, without hemorrhages, exudates or papilledema. Vision grossly normal. Ears:  External ear exam shows no significant lesions or deformities.  Otoscopic examination reveals clear canals, tympanic membranes are intact bilaterally without bulging, retraction, inflammation or discharge. Hearing is grossly normal bilaterally. Nose:  External nasal examination shows no deformity or inflammation. Nasal mucosa are pink and moist without lesions or exudates. Mouth:  Oral mucosa and oropharynx without lesions or exudates.  Teeth in good repair. Neck:  No deformities, masses, or tenderness noted. Chest Wall:  No deformities, masses, or tenderness noted. Breasts:  No mass, nodules, thickening, tenderness, bulging, retraction, inflamation, nipple discharge or skin changes noted.   Lungs:  Normal respiratory effort, chest expands symmetrically. Lungs are clear to auscultation, no crackles or wheezes. Heart:  Normal rate and regular rhythm. S1 and S2 normal without gallop, murmur, click, rub or other extra sounds. Abdomen:  Bowel sounds positive,abdomen soft and non-tender without masses, organomegaly or hernias noted. Genitalia:  Pelvic Exam:        External: normal female genitalia without lesions or masses        Vagina: normal without lesions or masses        Cervix: normal without lesions or masses        Adnexa: normal bimanual exam without masses or fullness        Uterus: normal by palpation  Pap smear: performed Msk:  No deformity or scoliosis noted of thoracic or lumbar spine.   Pulses:  R and L carotid,radial,femoral,dorsalis pedis and posterior tibial  pulses are full and equal bilaterally Extremities:  No clubbing, cyanosis, edema, or deformity noted with normal full range of motion of all joints.   Neurologic:  No cranial nerve deficits noted. Station and gait are normal. Plantar reflexes are down-going bilaterally. DTRs are symmetrical throughout. Sensory, motor and coordinative functions appear intact. Skin:  Intact without suspicious lesions or rashes Cervical Nodes:  No lymphadenopathy noted Axillary Nodes:  No palpable lymphadenopathy Inguinal Nodes:  No significant adenopathy Psych:  Cognition and judgment appear intact. Alert and cooperative with normal attention span and concentration. No apparent delusions, illusions, hallucinations   Impression & Recommendations:  Problem # 1:  HEALTH MAINTENANCE EXAM (ICD-V70.0)  Problem # 2:  GANGLION CYST (ICD-727.43)  Complete Medication List: 1)  Flonase 50 Mcg/act Susp (Fluticasone propionate) .... 2 sprays each nostril once daily 2)  Robaxin-750 750 Mg Tabs (Methocarbamol) .Marland Kitchen.. 1 q 6 hours as needed spasm 3)  Vicodin 5-500 Mg Tabs (Hydrocodone-acetaminophen) .Marland Kitchen.. 1 q 4 hours as needed pain 4)  Allegra Allergy 180 Mg Tabs (Fexofenadine hcl) .... Take 1 tab once daily 5)  Lipitor 40 Mg Tabs (Atorvastatin calcium) .Marland Kitchen.. 1 once daily 6)  Tri-sprintec 0.18/0.215/0.25 Mg-35 Mcg Tabs (Norgestim-eth estrad triphasic) .... Once daily  Other Orders: Tdap => 1yrs IM (84132) Admin 1st Vaccine (44010)  Patient Instructions: 1)  It is important that you exercise reguarly at least 20 minutes 5 times a week. If you develop chest pain, have severe difficulty breathing, or feel very tired, stop exercising immediately and seek medical attention.  2)  You need to lose weight. Consider a lower calorie diet and regular exercise.  3)  set up fasting labs soon  Prescriptions: FLONASE 50 MCG/ACT SUSP (FLUTICASONE PROPIONATE) 2 sprays each nostril once daily  #30 x 11   Entered and Authorized by:    Nelwyn Salisbury MD   Signed by:   Nelwyn Salisbury MD on 03/18/2010   Method used:   Elect[facility]ically to        [facility] [address] (retail)       [address]       [place], [place]  [address]       Ph: [phone] or [phone]       Fax: [phone]   RxID:   [id number] TRI-SPRINTEC 0.18/0.215/0.25 MG-35 MCG TABS (NORGESTIM-ETH ESTRAD TRIPHASIC) once daily  #30 x 11   Entered and Authorized by:   Nelwyn Salisbury MD   Signed by:   Nelwyn Salisbury MD on 03/18/2010   Method used:   [facility]ically to        [facility]  [place] [address] (retail[facility]       [address]       [place], [place]  [address]       Ph: [phone] or [phone]       Fax: [phone]   RxID:   [id number]    Orders Added: 1)  Tdap => 74yrs IM [90715] 2)  Admin 1st Vaccine [90471] 3)  Est. Patient 40-64 years [81829]   Immunizations Administered:  Tetanus Vaccine:    Vaccine Type: Tdap    Site: left deltoid    Mfr: GlaxoSmithKline    Dose: 0.5 ml    Route: IM    Given by: Pura Spice, RN  Exp. Date: 12/14/2011    Lot #: [id number]    VIS given: 01/12/08 version given March 18, 2010.   Immunizations Administered:  Tetanus Vaccine:    Vaccine Type: Tdap    Site: left deltoid    Mfr: GlaxoSmithKline    Dose: 0.5 ml    Route: IM    Given by: Pura Spice, RN    Exp. Date: 12/14/2011    Lot #: [id number]    VIS given: 01/12/08 version given March 18, 2010.

## 2010-04-08 ENCOUNTER — Encounter: Payer: Self-pay | Admitting: Family Medicine

## 2010-04-08 ENCOUNTER — Ambulatory Visit (INDEPENDENT_AMBULATORY_CARE_PROVIDER_SITE_OTHER): Payer: BC Managed Care – HMO | Admitting: Family Medicine

## 2010-04-08 VITALS — BP 130/90 | HR 88 | Temp 99.1°F

## 2010-04-08 DIAGNOSIS — J329 Chronic sinusitis, unspecified: Secondary | ICD-10-CM

## 2010-04-08 MED ORDER — FLUTICASONE PROPIONATE 50 MCG/ACT NA SUSP: 1.0000 | Freq: Every day | NASAL | Status: DC

## 2010-04-08 MED ORDER — DOXYCYCLINE HYCLATE 100 MG PO CAPS: 100.0000 mg | ORAL_CAPSULE | Freq: Two times a day (BID) | ORAL | Status: AC

## 2010-04-08 NOTE — Progress Notes (Signed)
  Subjective:    Patient ID: Zoe [name], female    DOB: [birth year], 40 y.o.   MRN: [id number]  HPI Here for one week of sinus pressure, ear pressure, ST,and a dry cough. No fever. She took a Zpack last month for another sinus infection.   Review of Systems  Constitutional: Negative.   HENT: Positive for ear pain, congestion and sinus pressure. Negative for hearing loss, tinnitus and ear discharge.   Eyes: Negative.   Respiratory: Positive for cough. Negative for shortness of breath and wheezing.        Objective:   Physical Exam  Constitutional: She appears well-developed and well-nourished. No distress.  HENT:  Head: Normocephalic and atraumatic.  Right Ear: External ear normal.  Left Ear: External ear normal.  Nose: Nose normal.  Mouth/Throat: Oropharynx is clear and moist. No oropharyngeal exudate.  Eyes: Conjunctivae and EOM are normal. Pupils are equal, round, and reactive to light.  Neck: Normal range of motion. Neck supple.  Cardiovascular: Normal rate, regular rhythm, normal heart sounds and intact distal pulses.  Exam reveals no gallop and no friction rub.   No murmur heard. Pulmonary/Chest: Effort normal and breath sounds normal. No respiratory distress. She has no wheezes. She has no rales. She exhibits no tenderness.  Lymphadenopathy:    She has no cervical adenopathy.          Assessment & Plan:  This is another sinusitis. We will use a different antibioic this time.

## 2010-05-02 ENCOUNTER — Telehealth: Payer: Self-pay | Admitting: Family Medicine

## 2010-05-02 NOTE — Telephone Encounter (Signed)
Pt called back to see if Dr Clent Ridges had responded to her previous message re: getting ov with him tomorrow. Dr Clent Ridges had already lft for the day. I sch pt an ov with Dr Clent Ridges on 05/03/10 at 10:15am.

## 2010-05-02 NOTE — Telephone Encounter (Signed)
Pt called and is req an ov with Dr Clent Ridges tomorrow for a recurring sinus inf. Pt refuses to see another dr. Martha Clan advise.

## 2010-05-03 ENCOUNTER — Encounter: Payer: Self-pay | Admitting: Family Medicine

## 2010-05-03 ENCOUNTER — Ambulatory Visit (INDEPENDENT_AMBULATORY_CARE_PROVIDER_SITE_OTHER): Payer: BC Managed Care – HMO | Admitting: Family Medicine

## 2010-05-03 VITALS — BP 120/70 | Temp 98.5°F | Wt 201.0 lb

## 2010-05-03 DIAGNOSIS — J329 Chronic sinusitis, unspecified: Secondary | ICD-10-CM

## 2010-05-03 MED ORDER — LEVOFLOXACIN 500 MG PO TABS: 500.0000 mg | ORAL_TABLET | Freq: Every day | ORAL | Status: AC

## 2010-05-03 NOTE — Progress Notes (Signed)
  Subjective:    Patient ID: Zoe [name], female    DOB: 12-20-1970, 40 y.o.   MRN: [id number]  HPI Here for recurrent sinus infections over the pat 3 months. She has taken a Zpack and a course of Doxycycline. Each time she feels better for awhile, but then the sympotms return. No she again has had one week of sinus pressure, PND, ST, and HA. No fever or cough.    Review of Systems  Constitutional: Negative.   HENT: Positive for congestion, postnasal drip and sinus pressure.   Eyes: Negative.   Respiratory: Negative.        Objective:   Physical Exam  Constitutional: She appears well-developed and well-nourished. No distress.  HENT:  Head: Normocephalic and atraumatic.  Right Ear: External ear normal.  Left Ear: External ear normal.  Nose: Nose normal.  Mouth/Throat: Oropharynx is clear and moist. No oropharyngeal exudate.  Eyes: Conjunctivae are normal. Pupils are equal, round, and reactive to light.  Neck: No thyromegaly present.  Pulmonary/Chest: Effort normal and breath sounds normal.  Lymphadenopathy:    She has no cervical adenopathy.          Assessment & Plan:  This is recurrent sinusitis, so we will try to eradicate it with a 14 day course of Levaquin.

## 2010-05-07 LAB — SURGICAL PCR SCREEN
MRSA, PCR: NEGATIVE
Staphylococcus aureus: NEGATIVE

## 2010-05-07 LAB — CBC
HCT: 38.6 % (ref 36.0–46.0)
Hemoglobin: 11.9 g/dL — ABNORMAL LOW (ref 12.0–15.0)
MCH: 23.7 pg — ABNORMAL LOW (ref 26.0–34.0)
MCHC: 30.8 g/dL (ref 30.0–36.0)
MCV: 76.9 fL — ABNORMAL LOW (ref 78.0–100.0)
Platelets: 285 10*3/uL (ref 150–400)
RBC: 5.02 MIL/uL (ref 3.87–5.11)
RDW: 16.8 % — ABNORMAL HIGH (ref 11.5–15.5)
WBC: 13.1 10*3/uL — ABNORMAL HIGH (ref 4.0–10.5)

## 2010-05-07 LAB — DIFFERENTIAL
Basophils Absolute: 0.1 10*3/uL (ref 0.0–0.1)
Basophils Relative: 0 % (ref 0–1)
Eosinophils Absolute: 0.2 10*3/uL (ref 0.0–0.7)
Eosinophils Relative: 1 % (ref 0–5)
Lymphocytes Relative: 25 % (ref 12–46)
Lymphs Abs: 3.3 10*3/uL (ref 0.7–4.0)
Monocytes Absolute: 1.1 10*3/uL — ABNORMAL HIGH (ref 0.1–1.0)
Monocytes Relative: 8 % (ref 3–12)
Neutro Abs: 8.5 10*3/uL — ABNORMAL HIGH (ref 1.7–7.7)
Neutrophils Relative %: 65 % (ref 43–77)

## 2010-05-07 LAB — COMPREHENSIVE METABOLIC PANEL
ALT: 25 U/L (ref 0–35)
AST: 28 U/L (ref 0–37)
Albumin: 3.2 g/dL — ABNORMAL LOW (ref 3.5–5.2)
Alkaline Phosphatase: 93 U/L (ref 39–117)
BUN: 5 mg/dL — ABNORMAL LOW (ref 6–23)
CO2: 25 mEq/L (ref 19–32)
Calcium: 8.8 mg/dL (ref 8.4–10.5)
Chloride: 105 mEq/L (ref 96–112)
Creatinine, Ser: 0.71 mg/dL (ref 0.4–1.2)
GFR calc Af Amer: >60 mL/min (ref >60)
GFR calc non Af Amer: >60 mL/min (ref >60)
Glucose, Bld: 87 mg/dL (ref 70–99)
Potassium: 4.4 mEq/L (ref 3.5–5.1)
Sodium: 137 mEq/L (ref 135–145)
Total Bilirubin: 0.3 mg/dL (ref 0.3–1.2)
Total Protein: 7.1 g/dL (ref 6.0–8.3)

## 2010-05-10 LAB — URINALYSIS, ROUTINE W REFLEX MICROSCOPIC
Bilirubin Urine: NEGATIVE
Glucose, UA: NEGATIVE mg/dL
Glucose, UA: NEGATIVE mg/dL
Ketones, ur: NEGATIVE mg/dL
Leukocytes, UA: NEGATIVE
Leukocytes, UA: NEGATIVE
Nitrite: NEGATIVE
Nitrite: NEGATIVE
Protein, ur: 100 mg/dL — AB
Protein, ur: 100 mg/dL — AB
Specific Gravity, Urine: 1.025 (ref 1.005–1.030)
Specific Gravity, Urine: 1.029 (ref 1.005–1.030)
Urobilinogen, UA: 0.2 mg/dL (ref 0.0–1.0)
Urobilinogen, UA: 0.2 mg/dL (ref 0.0–1.0)
pH: 5.5 (ref 5.0–8.0)
pH: 5.5 (ref 5.0–8.0)

## 2010-05-10 LAB — POCT CARDIAC MARKERS
CKMB, poc: 1.1 ng/mL (ref 1.0–8.0)
CKMB, poc: 1.4 ng/mL (ref 1.0–8.0)
Myoglobin, poc: 52.8 ng/mL (ref 12–200)
Myoglobin, poc: 58.7 ng/mL (ref 12–200)
Troponin i, poc: <0.05 ng/mL (ref 0.00–0.09)
Troponin i, poc: <0.05 ng/mL (ref 0.00–0.09)

## 2010-05-10 LAB — DIFFERENTIAL
Basophils Absolute: 0 10*3/uL (ref 0.0–0.1)
Basophils Relative: 0 % (ref 0–1)
Eosinophils Absolute: 0.1 10*3/uL (ref 0.0–0.7)
Eosinophils Relative: 1 % (ref 0–5)
Lymphocytes Relative: 17 % (ref 12–46)
Lymphs Abs: 2 10*3/uL (ref 0.7–4.0)
Monocytes Absolute: 0.8 10*3/uL (ref 0.1–1.0)
Monocytes Relative: 7 % (ref 3–12)
Neutro Abs: 9.1 10*3/uL — ABNORMAL HIGH (ref 1.7–7.7)
Neutrophils Relative %: 75 % (ref 43–77)

## 2010-05-10 LAB — URINE MICROSCOPIC-ADD ON

## 2010-05-10 LAB — COMPREHENSIVE METABOLIC PANEL
ALT: 33 U/L (ref 0–35)
AST: 40 U/L — ABNORMAL HIGH (ref 0–37)
Albumin: 3.3 g/dL — ABNORMAL LOW (ref 3.5–5.2)
Alkaline Phosphatase: 79 U/L (ref 39–117)
BUN: 10 mg/dL (ref 6–23)
CO2: 23 mEq/L (ref 19–32)
Calcium: 9.4 mg/dL (ref 8.4–10.5)
Chloride: 105 mEq/L (ref 96–112)
Creatinine, Ser: 0.87 mg/dL (ref 0.4–1.2)
GFR calc Af Amer: >60 mL/min (ref >60)
GFR calc non Af Amer: >60 mL/min (ref >60)
Glucose, Bld: 119 mg/dL — ABNORMAL HIGH (ref 70–99)
Potassium: 3.8 mEq/L (ref 3.5–5.1)
Sodium: 138 mEq/L (ref 135–145)
Total Bilirubin: 0.2 mg/dL — ABNORMAL LOW (ref 0.3–1.2)
Total Protein: 6.9 g/dL (ref 6.0–8.3)

## 2010-05-10 LAB — POCT PREGNANCY, URINE: Preg Test, Ur: NEGATIVE

## 2010-05-10 LAB — CBC
HCT: 37.7 % (ref 36.0–46.0)
Hemoglobin: 13.1 g/dL (ref 12.0–15.0)
MCH: 29 pg (ref 26.0–34.0)
MCHC: 34.8 g/dL (ref 30.0–36.0)
MCV: 83.4 fL (ref 78.0–100.0)
Platelets: 281 10*3/uL (ref 150–400)
RBC: 4.52 MIL/uL (ref 3.87–5.11)
RDW: 14.8 % (ref 11.5–15.5)
WBC: 12.1 10*3/uL — ABNORMAL HIGH (ref 4.0–10.5)

## 2010-05-10 LAB — D-DIMER, QUANTITATIVE (NOT AT ARMC): D-Dimer, Quant: 1.03 ug/mL-FEU — ABNORMAL HIGH (ref 0.00–0.48)

## 2010-05-10 LAB — LIPASE, BLOOD: Lipase: 30 U/L (ref 11–59)

## 2010-06-14 ENCOUNTER — Ambulatory Visit: Payer: BC Managed Care – HMO | Admitting: Family Medicine

## 2010-06-14 ENCOUNTER — Encounter: Payer: Self-pay | Admitting: Family Medicine

## 2010-06-14 ENCOUNTER — Ambulatory Visit (INDEPENDENT_AMBULATORY_CARE_PROVIDER_SITE_OTHER): Payer: BC Managed Care – HMO | Admitting: Family Medicine

## 2010-06-14 VITALS — BP 140/92 | HR 90 | Temp 98.7°F

## 2010-06-14 DIAGNOSIS — J329 Chronic sinusitis, unspecified: Secondary | ICD-10-CM

## 2010-06-14 MED ORDER — LEVOFLOXACIN 500 MG PO TABS: 500.0000 mg | ORAL_TABLET | Freq: Every day | ORAL | Status: AC

## 2010-06-14 NOTE — Progress Notes (Signed)
  Subjective:    Patient ID: Zoe [name], female    DOB: [birth year], 40 y.o.   MRN: [id number]  HPI Here for another sinus infection. For 5 days she has had sinus pressure, PND, HA, and ST. No fever or cough. She took a course of Levaquin about 6 weeks ago, and this worked well.    Review of Systems  Constitutional: Negative.   HENT: Positive for congestion, postnasal drip and sinus pressure.   Eyes: Negative.   Respiratory: Negative.        Objective:   Physical Exam  Constitutional: She appears well-developed and well-nourished.  HENT:  Head: Normocephalic and atraumatic.  Right Ear: External ear normal.  Left Ear: External ear normal.  Nose: Nose normal.  Mouth/Throat: Oropharynx is clear and moist. No oropharyngeal exudate.  Eyes: Conjunctivae are normal. Pupils are equal, round, and reactive to light.  Neck: Normal range of motion. Neck supple.  Pulmonary/Chest: Effort normal and breath sounds normal.  Lymphadenopathy:    She has no cervical adenopathy.          Assessment & Plan:  We will try a 30 day course of antibiotics to break this recurrence cycle.

## 2010-07-12 NOTE — Discharge Summary (Signed)
[facility]  Patient:    Zoe [name], Zoe [name] Visit Number: [id number] MRN: [id number]          Service Type: OBS Location: 910A 9132 01 Attending Physician:  Frederich Balding Dictated by:   Danie Chandler, R.N. Admit Date:  03/20/2001 Discharge Date: 03/22/2001                             Discharge Summary  ADMITTING DIAGNOSES:          1. Intrauterine pregnancy at term with                                  spontaneous rupture of membranes.                               2. Prior cesarean section, desires trial of                                  labor.  DISCHARGE DIAGNOSES:          1. Intrauterine pregnancy at term with                                  spontaneous rupture of membranes.                               2. Prior cesarean section, desires trial of                                  labor.                               3. Failure to progress.                               4. Possible signs of uterine scar rupture with                                  increasing bleeding pattern.                               5. Multiparity, desires sterility.  PROCEDURE:                    On March 20, 2001: Repeat low transverse                               cesarean section with bilateral tubal ligation.  REASON FOR ADMISSION:         The patient is a 40 year old, gravida 3, para 2, married white female who presented at term with spontaneous rupture of membranes. The first pregnancy was by low transverse cesarean section for failure to progress. The second pregnancy was a successful vaginal delivery. The patient proceeded with  a trial of labor. She arrested at 4 cm of dilatation. An intrauterine pressure catheter had been put in place and Pitocin was begun. With this, there was some repetitive variable decelerations. Suddenly, with an intrauterine catheter, bright red blood was noted. Concern was about a uterine scar rupture. The cervix remained  4 cm for approximately two hours despite adequate uterine activity. Because of the lack of progression and concern about the bleeding, the decision was made to proceed with a repeat cesarean section.  HOSPITAL COURSE:              The patient was taken to the operating room and underwent the above-named procedure without complication. This was productive of a viable female infant with Apgars of 8 at one minute and 9 at five minutes and an arterial cord pH of 7.28.  Postoperatively on day #1, the patient was without complaint. She had a good return of bowel function. Her hemoglobin was 9.9, hematocrit 28.4, and white blood cell count 12.5. She was discharged home on postoperative day #2. She was ambulating well without difficulty and had good pain control. She was also tolerating a regular diet.  CONDITION ON DISCHARGE:       Good.  DIET:                         Regular as tolerated.  ACTIVITY:                     No heavy lifting, no driving, no vaginal entry.  DISCHARGE FOLLOWUP:           The patient is to follow up in the office for an appointment on January 28 and she is to call for temperature greater than 100 degrees, persistent nausea or vomiting, heavy vaginal bleeding, and/or redness or drainage from the incision site.  DISCHARGE MEDICATIONS:        1. Prenatal vitamin one p.o. q.d.                               2. Pain medications as directed by M.D.Dictated by:   Danie Chandler, R.N. Attending Physician:  Frederich Balding DD:  04/02/01 TD:  04/04/01 Job: [id number]

## 2010-07-12 NOTE — Op Note (Signed)
[facility]  Patient:    Zoe [name], Zoe [name] Visit Number: [id number] MRN: [id number]          Service Type: OBS Location: 910A 9132 01 Attending Physician:  Frederich Balding Dictated by:   Juluis Mire, M.D. Proc. Date: 03/20/01 Admit Date:  03/20/2001                             Operative Report  PREOPERATIVE DIAGNOSES:       1. Intrauterine pregnancy at term with                                  spontaneous rupture of membranes.                               2. Prior cesarean section, desires a trial of                                  labor.                               3. Failure to progress.                               4. Possible signs of uterine scar rupture with                                  increasing bleeding pattern.                               5. Multiparity, desires sterility.  POSTOPERATIVE DIAGNOSES:      1. Intrauterine pregnancy at term with                                  spontaneous rupture of membranes.                               2. Prior cesarean section, desires a trial of                                  labor.                               3. Failure to progress.                               4. No evidence of uterine scar rupture.                               5. Multiparity, desires sterility.  OPERATIVE PROCEDURE:          Low transverse cesarean section with bilateral tubal ligation.  SURGEON:  Juluis Mire, M.D.  ANESTHESIA:                   Epidural.  ESTIMATED BLOOD LOSS:         800 cc.  PACKS AND DRAINS:             None.  INTRAOPERATIVE BLOOD REPLACED:               None.  COMPLICATIONS:                None.  INDICATIONS:                  The patient is a 40 year old gravida 3, para 2 married white female who presents at term with spontaneous rupture of membranes.  Her first pregnancy was by low transverse cesarean section for failure to progress.  Her second  pregnancy was successful vaginal delivery. We discussed the risks of trial of labor.  She proceeded with a trial of labor.  We arrested at 4 cm of dilatation.  An intrauterine pressure catheter had been put in place and Pitocin was begun.  With this, we had some repetitive variable decelerations.  Suddenly with an intrauterine catheter, bright red blood was noted.  Concern was about a uterine scar rupture. The cervix remained 4 cm at approximately two hours despite adequate uterine activity.  Because of lack of progression and the concern about the bleeding, terminal ileum was decided to proceed with repeat cesarean section after a discussion with the patient.  She was desirous of permanent sterilization. Alternative forms of birth control were discussed.  A failure rate of 1 in 200 was quoted.  Failures can be in the form of ectopic pregnancies, requiring further surgical management.  The risks of surgery were discussed including the risks of anesthesia, the risk of infection, the risk of hemorrhage, the risk of injury to adjacent organs that could require further exploratory surgery, the risk of deep venous thrombosis and pulmonary embolus.  The patient expressed an understanding of the indications and risks.  DESCRIPTION OF PROCEDURE:     The patient was taken to the OR and placed in the supine position with a left lateral tilt.  After a satisfactory level of epidural anesthesia was obtained, the abdomen was prepped out with Betadine and draped as a sterile field.  Her prior otherwise transverse skin incision was then excised.  The incision was extended through the subcutaneous tissue. The fascia was entered sharply and the incision in the fascia extended laterally.  The fascial was taken off the muscle superiorly and inferiorly. The rectus muscles were separated in the midline.  The peritoneum was entered sharply and the incision in the peritoneum extended both superiorly  and inferiorly.  No adhesions were noted.  A low transverse bladder flap was developed.  A low transverse uterine incision was begun in a knife and extended laterally using manual traction.  The infant was in the vertex presentation and was delivered with elevation of the head and fundal pressure.  The infant was a viable female who weighed 6 lb 1 oz with Apgars of 8/9. Umbilical artery pH was 7.28.  A nuchal cord x 1 was noted.  Amniotic fluid was clear.  The placenta was the delivered manually.  The uterus was closed with interlocking sutures of 0 chromic in a two-layer closure technique.  Some bleeding from the right ______ were controlled with two figure-of-eights of 0 chromic.  We had good hemostasis at  this point and clear urine output.  The uterus was exteriorized.  The tubes and ovaries were unremarkable.  A midsegment of each tube was elevated with a Babcock tenaculum.  A hole was made in an avascular area of the mesosalpinx.  Individual ligatures of 0 plain catgut were used to ligate off a segment of tube.  The intervening segment of tube was then excised.  The cuts ends of the tubes were cauterized.  Good hemostasis was noted.  The uterus was returned to the abdominal cavity.  We had good hemostasis and adequate urine output.  The muscles were reapproximated with a running suture of 3-0 Vicryl.  The fascia was closed with a running suture of 0 PDS.  The skin was closed with staples and Steri-Strips.  Sponge, needle and instrument counts were reported as correct by the circulating nurse x 2.  The patient tolerated the procedure well and was returned to the recovery room in good condition. Dictated by:   Juluis Mire, M.D. Attending Physician:  Frederich Balding DD:  03/20/01 TD:  03/21/01 Job: [id number]

## 2010-08-25 ENCOUNTER — Other Ambulatory Visit: Payer: Self-pay | Admitting: Family Medicine

## 2010-08-27 ENCOUNTER — Other Ambulatory Visit: Payer: Self-pay | Admitting: Family Medicine

## 2010-08-27 DIAGNOSIS — Z1231 Encounter for screening mammogram for malignant neoplasm of breast: Secondary | ICD-10-CM

## 2010-10-02 ENCOUNTER — Ambulatory Visit
Admission: RE | Admit: 2010-10-02 | Discharge: 2010-10-02 | Disposition: A | Discharge status: Home / Self Care | Payer: BC Managed Care – PPO | Source: Ambulatory Visit | Attending: Family Medicine | Admitting: Family Medicine

## 2010-10-02 DIAGNOSIS — Z1231 Encounter for screening mammogram for malignant neoplasm of breast: Secondary | ICD-10-CM

## 2010-11-18 ENCOUNTER — Encounter: Payer: Self-pay | Admitting: Family Medicine

## 2010-11-18 ENCOUNTER — Ambulatory Visit (INDEPENDENT_AMBULATORY_CARE_PROVIDER_SITE_OTHER): Payer: BC Managed Care – PPO | Admitting: Family Medicine

## 2010-11-18 VITALS — BP 118/78 | HR 92 | Temp 98.7°F | Wt 195.0 lb

## 2010-11-18 DIAGNOSIS — R350 Frequency of micturition: Secondary | ICD-10-CM

## 2010-11-18 DIAGNOSIS — N390 Urinary tract infection, site not specified: Secondary | ICD-10-CM

## 2010-11-18 LAB — POCT URINALYSIS DIPSTICK
Bilirubin, UA: NEGATIVE
Glucose, UA: NEGATIVE
Ketones, UA: NEGATIVE
Leukocytes, UA: NEGATIVE
Nitrite, UA: NEGATIVE
Spec Grav, UA: 1.025
Urobilinogen, UA: 0.2
pH, UA: 5.5

## 2010-11-18 MED ORDER — CIPROFLOXACIN HCL 500 MG PO TABS: 500.0000 mg | ORAL_TABLET | Freq: Two times a day (BID) | ORAL | Status: AC

## 2010-11-18 NOTE — Progress Notes (Signed)
  Subjective:    Patient ID: Zoe [name], female    DOB: 02-17-1971, 40 y.o.   MRN: [id number]  HPI Here for one week of urinary urgency and burning. No fever or nausea. Using Azo.    Review of Systems  Constitutional: Negative.   Respiratory: Negative.   Cardiovascular: Negative.   Gastrointestinal: Negative.   Genitourinary: Positive for dysuria, urgency and frequency. Negative for flank pain.       Objective:   Physical Exam  Constitutional: She appears well-developed and well-nourished.  Abdominal: Bowel sounds are normal. She exhibits no distension and no mass. There is no tenderness. There is no rebound and no guarding.          Assessment & Plan:  Drink plenty of water

## 2010-12-04 ENCOUNTER — Telehealth: Payer: Self-pay | Admitting: Family Medicine

## 2010-12-04 NOTE — Telephone Encounter (Signed)
Pt having sharp pain in lft ear. Pt had vertigo last wk and [facility]. Pt was told to take otc dramamine, which helped symptoms. Now having ear pain.

## 2010-12-04 NOTE — Telephone Encounter (Signed)
Pt having sharp pain in lft ear. Pt had vertigo last wk and [facility]. Pt was told to take otc dramamine, which helped symptoms. Now having ear pain. °

## 2010-12-05 ENCOUNTER — Ambulatory Visit (INDEPENDENT_AMBULATORY_CARE_PROVIDER_SITE_OTHER): Payer: BC Managed Care – PPO | Admitting: Family Medicine

## 2010-12-05 ENCOUNTER — Encounter: Payer: Self-pay | Admitting: Family Medicine

## 2010-12-05 VITALS — BP 116/80 | HR 85 | Temp 98.8°F | Ht 60.5 in | Wt 198.0 lb

## 2010-12-05 DIAGNOSIS — J329 Chronic sinusitis, unspecified: Secondary | ICD-10-CM

## 2010-12-05 MED ORDER — LEVOFLOXACIN 500 MG PO TABS: 500.0000 mg | ORAL_TABLET | Freq: Every day | ORAL | Status: DC

## 2010-12-05 NOTE — Telephone Encounter (Signed)
She needs an OV 

## 2010-12-05 NOTE — Telephone Encounter (Signed)
Left message to call for an office visit.

## 2010-12-05 NOTE — Progress Notes (Signed)
  Subjective:    Patient ID: Zoe [name], female    DOB: [birth year], 40 y.o.   MRN: [id number]  HPI Here for evolving sinus symptoms. One week ago she had vertigo for a few days, so she went to [facility]. They gave her Dramamine which helped. This resolved but then several days ago she devloped sinus pressure, HA, left ear pain, PND,and a ST. No fever. On Mucinex    Review of Systems  Constitutional: Negative.   HENT: Positive for congestion, postnasal drip and sinus pressure.   Eyes: Negative.   Respiratory: Negative.        Objective:   Physical Exam  Constitutional: She appears well-developed and well-nourished.  HENT:  Right Ear: External ear normal.  Left Ear: External ear normal.  Nose: Nose normal.  Mouth/Throat: Oropharynx is clear and moist. No oropharyngeal exudate.  Eyes: Conjunctivae are normal. Pupils are equal, round, and reactive to light.  Neck: No thyromegaly present.  Pulmonary/Chest: Effort normal and breath sounds normal.  Lymphadenopathy:    She has no cervical adenopathy.          Assessment & Plan:  Drink fluids, use Motrin prn

## 2010-12-10 ENCOUNTER — Telehealth: Payer: Self-pay | Admitting: Family Medicine

## 2010-12-10 NOTE — Telephone Encounter (Signed)
Pt was in last week with the beginnings of a sinus infections, pt said she is getting worse and the meds she has been taking are not helping pt was wanting a different prescription called in. Please contact pt

## 2010-12-10 NOTE — Telephone Encounter (Signed)
Pt called again checking on the status of her message.

## 2010-12-11 MED ORDER — DOXYCYCLINE HYCLATE 100 MG PO TABS: 100.0000 mg | ORAL_TABLET | Freq: Two times a day (BID) | ORAL | Status: AC

## 2010-12-11 NOTE — Telephone Encounter (Signed)
Script called in and pt aware. 

## 2010-12-11 NOTE — Telephone Encounter (Signed)
Pt called again checking on previous phone calls

## 2010-12-11 NOTE — Telephone Encounter (Signed)
Call in Doxycycline 100 mg bid for 10 days  

## 2010-12-12 ENCOUNTER — Encounter: Payer: Self-pay | Admitting: Family Medicine

## 2010-12-12 ENCOUNTER — Ambulatory Visit (INDEPENDENT_AMBULATORY_CARE_PROVIDER_SITE_OTHER): Payer: BC Managed Care – PPO | Admitting: Family Medicine

## 2010-12-12 VITALS — BP 130/84 | HR 96 | Temp 98.6°F | Wt 199.0 lb

## 2010-12-12 DIAGNOSIS — J329 Chronic sinusitis, unspecified: Secondary | ICD-10-CM

## 2010-12-12 MED ORDER — METHYLPREDNISOLONE ACETATE 80 MG/ML IJ SUSP
120.0000 mg | Freq: Once | INTRAMUSCULAR | Status: AC
  Administered 2010-12-12: 120 mg via INTRAMUSCULAR

## 2010-12-12 NOTE — Progress Notes (Signed)
  Subjective:    Patient ID: Zoe [name], female    DOB: 1970-09-08, 40 y.o.   MRN: [id number]  HPI Here for continued symtoms of HA, sinus pressure, PND,and a dry cough. This has not improved with a week of Levaquin and Mucinex.    Review of Systems  Constitutional: Negative.   HENT: Positive for congestion, postnasal drip and sinus pressure.   Eyes: Negative.   Respiratory: Positive for cough.        Objective:   Physical Exam  Constitutional: She appears well-developed and well-nourished.  HENT:  Right Ear: External ear normal.  Left Ear: External ear normal.  Nose: Nose normal.  Mouth/Throat: Oropharynx is clear and moist. No oropharyngeal exudate.  Eyes: Pupils are equal, round, and reactive to light.  Neck: No thyromegaly present.  Pulmonary/Chest: Effort normal and breath sounds normal.  Lymphadenopathy:    She has no cervical adenopathy.          Assessment & Plan:  Given a steroid shot. Switch to Doxycycline. Recheck prn

## 2010-12-12 NOTE — Progress Notes (Signed)
Addended by: Aniceto Boss A on: 12/12/2010 12:20 PM   Modules accepted: Orders

## 2010-12-30 ENCOUNTER — Ambulatory Visit (INDEPENDENT_AMBULATORY_CARE_PROVIDER_SITE_OTHER): Payer: BC Managed Care – PPO | Admitting: Family Medicine

## 2010-12-30 VITALS — BP 130/80 | HR 106 | Temp 98.5°F | Wt 196.0 lb

## 2010-12-30 DIAGNOSIS — J40 Bronchitis, not specified as acute or chronic: Secondary | ICD-10-CM

## 2010-12-30 MED ORDER — HYDROCODONE-HOMATROPINE 5-1.5 MG/5ML PO SYRP: 5.0000 mL | ORAL_SOLUTION | ORAL | Status: AC | PRN

## 2010-12-30 MED ORDER — DOXYCYCLINE HYCLATE 100 MG PO CAPS: 100.0000 mg | ORAL_CAPSULE | Freq: Two times a day (BID) | ORAL | Status: AC

## 2010-12-30 NOTE — Progress Notes (Signed)
  Subjective:    Patient ID: Zoe [name], female    DOB: [birth year], 40 y.o.   MRN: [id number]  HPI Here for 4 days of ST, hoarseness, chest congestion, and dry cough. She got over the sinus infection she had a few weeks ago, and now her current symptoms are very different.    Review of Systems  Constitutional: Negative.   HENT: Positive for congestion, postnasal drip and sinus pressure.   Eyes: Negative.   Respiratory: Positive for cough.        Objective:   Physical Exam  Constitutional: She appears well-developed and well-nourished.  HENT:  Right Ear: External ear normal.  Left Ear: External ear normal.  Nose: Nose normal.  Mouth/Throat: Oropharynx is clear and moist. No oropharyngeal exudate.  Eyes: Conjunctivae are normal. Pupils are equal, round, and reactive to light.  Neck: No thyromegaly present.  Pulmonary/Chest: Effort normal and breath sounds normal.  Lymphadenopathy:    She has no cervical adenopathy.          Assessment & Plan:  Off work tomorrow

## 2011-02-05 ENCOUNTER — Other Ambulatory Visit: Payer: Self-pay | Admitting: Family Medicine

## 2011-02-28 ENCOUNTER — Encounter: Payer: Self-pay | Admitting: Family

## 2011-02-28 ENCOUNTER — Ambulatory Visit (INDEPENDENT_AMBULATORY_CARE_PROVIDER_SITE_OTHER): Payer: BC Managed Care – PPO | Admitting: Family

## 2011-02-28 VITALS — BP 120/70 | Temp 98.5°F | Wt 202.0 lb

## 2011-02-28 DIAGNOSIS — J019 Acute sinusitis, unspecified: Secondary | ICD-10-CM

## 2011-02-28 MED ORDER — CEFUROXIME AXETIL 250 MG PO TABS: 250.0000 mg | ORAL_TABLET | Freq: Two times a day (BID) | ORAL | Status: AC

## 2011-02-28 NOTE — Patient Instructions (Signed)

## 2011-02-28 NOTE — Progress Notes (Signed)
Subjective:    Patient ID: Zoe [name], female    DOB: 1970-10-18, 41 y.o.   MRN: [id number]  HPI 41-year-old white female, nonsmoker, patient of Dr. Claris Che M. with complaints of two-week stuffiness, nasal congestion, sinus pressure and pain, and cough that is worsening over the last 2 days. She's been taking over-the-counter Mucinex that initially was helping and is no longer helping. She denies any fever, shortness of breath, chest pain, nausea or vomiting.   Review of Systems  Constitutional: Negative.   HENT: Positive for congestion, rhinorrhea, sneezing, postnasal drip and sinus pressure.   Eyes: Negative.   Respiratory: Positive for cough.   Cardiovascular: Negative.   Gastrointestinal: Negative.   Musculoskeletal: Negative.   Skin: Negative.   Neurological: Negative.   Hematological: Negative.        Past Medical History  Diagnosis Date  . Allergy   . Anemia   . Chickenpox   . Osteoarthritis   . Menorrhagia   . Benign hematuria     worked up by Dr. Ihor Gully  . Horseshoe kidney     History   Social History  . Marital Status: Single    Spouse Name: N/A    Number of Children: N/A  . Years of Education: N/A   Occupational History  . Not on file.   Social History Main Topics  . Smoking status: Never Smoker   . Smokeless tobacco: Never Used  . Alcohol Use: No  . Drug Use: No  . Sexually Active: Not on file   Other Topics Concern  . Not on file   Social History Narrative  . No narrative on file    Past Surgical History  Procedure Date  . Cesarean section   . Tubal ligation   . Tympanostomy     Family History  Problem Relation Age of Onset  . Arthritis    . Coronary artery disease    . Depression    . Diabetes    . Hyperlipidemia    . Sudden death      Allergies  Allergen Reactions  . Penicillins     REACTION: hives    Current Outpatient Prescriptions on File Prior to Visit  Medication Sig Dispense Refill  . atorvastatin  (LIPITOR) 40 MG tablet TAKE 1 TABLET BY MOUTH EVERY DAY  30 tablet  8  . fexofenadine (ALLEGRA) 180 MG tablet Take 180 mg by mouth daily.        . fluticasone (FLONASE) 50 MCG/ACT nasal spray 1 spray by Nasal route daily.        Marland Kitchen HYDROcodone-acetaminophen (VICODIN) 5-500 MG per tablet Take 1 tablet by mouth every 6 (six) hours as needed.        . methocarbamol (ROBAXIN) 750 MG tablet Take 750 mg by mouth 4 (four) times daily as needed.        . TRI-SPRINTEC 0.18/0.215/0.25 MG-35 MCG tablet TAKE 1 TABLET BY MOUTH EVERY DAY  28 tablet  11    BP 120/70  Temp(Src) 98.5 F (36.9 C) (Oral)  Wt 202 lb (91.627 kg)chart Objective:   Physical Exam  Constitutional: She is oriented to person, place, and time. She appears well-developed and well-nourished.  HENT:  Right Ear: External ear normal.  Left Ear: External ear normal.  Nose: Nose normal.  Mouth/Throat: Oropharynx is clear and moist.       Maxillary sinus tenderness to palpation.  Neck: Normal range of motion. Neck supple.  Cardiovascular: Normal rate, regular rhythm and normal  heart sounds.   Pulmonary/Chest: Effort normal and breath sounds normal.  Musculoskeletal: Normal range of motion.  Neurological: She is alert and oriented to person, place, and time.  Skin: Skin is warm and dry.  Psychiatric: She has a normal mood and affect.          Assessment & Plan:  Assessment: Acute sinusitis  Plan: Ceftin 250 mg one tablet twice a day for 10 days. Antihistamine decongestant as directed. Rest. Drink plenty of fluids. Call if symptoms worsen or persist. Recheck as scheduled, and when necessary.

## 2011-03-24 ENCOUNTER — Other Ambulatory Visit: Payer: BC Managed Care – PPO

## 2011-03-26 ENCOUNTER — Other Ambulatory Visit (INDEPENDENT_AMBULATORY_CARE_PROVIDER_SITE_OTHER): Payer: BC Managed Care – PPO

## 2011-03-26 DIAGNOSIS — Z0000 Encounter for general adult medical examination without abnormal findings: Secondary | ICD-10-CM

## 2011-03-26 LAB — CBC WITH DIFFERENTIAL/PLATELET
Basophils Absolute: 0.1 10*3/uL (ref 0.0–0.1)
Basophils Relative: 0.6 % (ref 0.0–3.0)
Eosinophils Absolute: 0.3 10*3/uL (ref 0.0–0.7)
Eosinophils Relative: 2.4 % (ref 0.0–5.0)
HCT: 31.1 % — ABNORMAL LOW (ref 36.0–46.0)
Hemoglobin: 10 g/dL — ABNORMAL LOW (ref 12.0–15.0)
Lymphocytes Relative: 20.3 % (ref 12.0–46.0)
Lymphs Abs: 2.3 10*3/uL (ref 0.7–4.0)
MCHC: 32.3 g/dL (ref 30.0–36.0)
MCV: 67.1 fl — ABNORMAL LOW (ref 78.0–100.0)
Monocytes Absolute: 1 10*3/uL (ref 0.1–1.0)
Monocytes Relative: 8.4 % (ref 3.0–12.0)
Neutro Abs: 7.8 10*3/uL — ABNORMAL HIGH (ref 1.4–7.7)
Neutrophils Relative %: 68.3 % (ref 43.0–77.0)
Platelets: 333 10*3/uL (ref 150.0–400.0)
RBC: 4.64 Mil/uL (ref 3.87–5.11)
RDW: 18.8 % — ABNORMAL HIGH (ref 11.5–14.6)
WBC: 11.5 10*3/uL — ABNORMAL HIGH (ref 4.5–10.5)

## 2011-03-26 LAB — BASIC METABOLIC PANEL
BUN: 10 mg/dL (ref 6–23)
CO2: 22 mEq/L (ref 19–32)
Calcium: 9.3 mg/dL (ref 8.4–10.5)
Chloride: 105 mEq/L (ref 96–112)
Creatinine, Ser: 0.6 mg/dL (ref 0.4–1.2)
GFR: 112.5 mL/min (ref >60.00)
Glucose, Bld: 82 mg/dL (ref 70–99)
Potassium: 4.2 mEq/L (ref 3.5–5.1)
Sodium: 138 mEq/L (ref 135–145)

## 2011-03-26 LAB — LIPID PANEL
Cholesterol: 195 mg/dL (ref 0–200)
HDL: 51.3 mg/dL (ref >39.00)
LDL Cholesterol: 130 mg/dL — ABNORMAL HIGH (ref 0–99)
Total CHOL/HDL Ratio: 4
Triglycerides: 68 mg/dL (ref 0.0–149.0)
VLDL: 13.6 mg/dL (ref 0.0–40.0)

## 2011-03-26 LAB — HEPATIC FUNCTION PANEL
ALT: 28 U/L (ref 0–35)
AST: 30 U/L (ref 0–37)
Albumin: 3.7 g/dL (ref 3.5–5.2)
Alkaline Phosphatase: 89 U/L (ref 39–117)
Bilirubin, Direct: 0 mg/dL (ref 0.0–0.3)
Total Bilirubin: 0.3 mg/dL (ref 0.3–1.2)
Total Protein: 7.8 g/dL (ref 6.0–8.3)

## 2011-03-26 LAB — TSH: TSH: 4 u[IU]/mL (ref 0.35–5.50)

## 2011-03-26 LAB — POCT URINALYSIS DIPSTICK
Bilirubin, UA: NEGATIVE
Glucose, UA: NEGATIVE
Ketones, UA: NEGATIVE
Leukocytes, UA: NEGATIVE
Nitrite, UA: NEGATIVE
Spec Grav, UA: 1.01
Urobilinogen, UA: 0.2
pH, UA: 5

## 2011-03-28 ENCOUNTER — Encounter: Payer: Self-pay | Admitting: Family Medicine

## 2011-03-28 MED ORDER — FERROUS SULFATE 325 (65 FE) MG PO TABS: 325.0000 mg | ORAL_TABLET | Freq: Every day | ORAL | Status: DC

## 2011-03-28 NOTE — Progress Notes (Signed)
Addended by: Aniceto Boss A on: 03/28/2011 04:47 PM   Modules accepted: Orders

## 2011-03-28 NOTE — Progress Notes (Signed)
Quick Note:  Left voice message, put a copy of results in mail and sent script e-scribe. ______

## 2011-03-31 ENCOUNTER — Encounter: Payer: BC Managed Care – PPO | Admitting: Family Medicine

## 2011-04-01 ENCOUNTER — Ambulatory Visit (INDEPENDENT_AMBULATORY_CARE_PROVIDER_SITE_OTHER): Payer: BC Managed Care – PPO | Admitting: Family Medicine

## 2011-04-01 ENCOUNTER — Encounter: Payer: Self-pay | Admitting: Family Medicine

## 2011-04-01 ENCOUNTER — Other Ambulatory Visit (HOSPITAL_COMMUNITY)
Admission: RE | Admit: 2011-04-01 | Discharge: 2011-04-01 | Disposition: A | Discharge status: Home / Self Care | Payer: BC Managed Care – PPO | Source: Ambulatory Visit | Attending: Family Medicine | Admitting: Family Medicine

## 2011-04-01 VITALS — BP 118/78 | HR 100 | Temp 98.4°F | Ht 59.75 in | Wt 199.0 lb

## 2011-04-01 DIAGNOSIS — N920 Excessive and frequent menstruation with regular cycle: Secondary | ICD-10-CM

## 2011-04-01 DIAGNOSIS — J329 Chronic sinusitis, unspecified: Secondary | ICD-10-CM

## 2011-04-01 DIAGNOSIS — Z01419 Encounter for gynecological examination (general) (routine) without abnormal findings: Secondary | ICD-10-CM | POA: Insufficient documentation

## 2011-04-01 DIAGNOSIS — Z0000 Encounter for general adult medical examination without abnormal findings: Secondary | ICD-10-CM

## 2011-04-01 MED ORDER — ATORVASTATIN CALCIUM 40 MG PO TABS: 40.0000 mg | ORAL_TABLET | Freq: Every day | ORAL | Status: DC

## 2011-04-01 MED ORDER — NORGESTIM-ETH ESTRAD TRIPHASIC 0.18/0.215/0.25 MG-35 MCG PO TABS: 1.0000 | ORAL_TABLET | Freq: Every day | ORAL | Status: DC

## 2011-04-01 MED ORDER — HYDROCODONE-ACETAMINOPHEN 5-500 MG PO TABS: 1.0000 | ORAL_TABLET | Freq: Four times a day (QID) | ORAL | Status: DC | PRN

## 2011-04-01 NOTE — Progress Notes (Signed)
Addended by: Gershon Crane A on: 04/01/2011 11:11 AM   Modules accepted: Orders

## 2011-04-01 NOTE — Progress Notes (Signed)
Subjective:    Patient ID: Zoe [name], female    DOB: 30-Mar-1970, 41 y.o.   MRN: [id number]  HPI 41 yr old female for a cpx. She is doing well except for her heavy periods. They are regular and last only 5 days, but they are very heavy. Still using Tri-Sprintec. Her anemia has gotten worse lately with a Hgb of 10.0, and I think this is due to the menstrual blood losses. We started her back on iron supplementation.    Review of Systems  Constitutional: Negative.  Negative for fever, diaphoresis, activity change, appetite change, fatigue and unexpected weight change.  HENT: Negative.  Negative for hearing loss, ear pain, nosebleeds, congestion, sore throat, trouble swallowing, neck pain, neck stiffness, voice change and tinnitus.   Eyes: Negative.  Negative for photophobia, pain, discharge, redness and visual disturbance.  Respiratory: Negative.  Negative for apnea, cough, choking, chest tightness, shortness of breath, wheezing and stridor.   Cardiovascular: Negative.  Negative for chest pain, palpitations and leg swelling.  Gastrointestinal: Negative.  Negative for nausea, vomiting, abdominal pain, diarrhea, constipation, blood in stool, abdominal distention and rectal pain.  Genitourinary: Negative.  Negative for dysuria, urgency, frequency, hematuria, flank pain, vaginal bleeding, vaginal discharge, enuresis, difficulty urinating, vaginal pain and menstrual problem.  Musculoskeletal: Negative.  Negative for myalgias, back pain, joint swelling, arthralgias and gait problem.  Skin: Negative.  Negative for color change, pallor, rash and wound.  Neurological: Negative.  Negative for dizziness, tremors, seizures, syncope, speech difficulty, weakness, light-headedness, numbness and headaches.  Hematological: Negative.  Negative for adenopathy. Does not bruise/bleed easily.  Psychiatric/Behavioral: Negative.  Negative for hallucinations, behavioral problems, confusion, sleep disturbance,  dysphoric mood and agitation. The patient is not nervous/anxious.        Objective:   Physical Exam  Constitutional: She appears well-developed and well-nourished. No distress.  HENT:  Head: Normocephalic and atraumatic.  Right Ear: External ear normal.  Left Ear: External ear normal.  Nose: Nose normal.  Mouth/Throat: Oropharynx is clear and moist. No oropharyngeal exudate.  Eyes: Conjunctivae and EOM are normal. Pupils are equal, round, and reactive to light. Right eye exhibits no discharge. Left eye exhibits no discharge. No scleral icterus.  Neck: Normal range of motion. Neck supple. No JVD present. No thyromegaly present.  Cardiovascular: Normal rate, regular rhythm, normal heart sounds and intact distal pulses.  Exam reveals no gallop and no friction rub.   No murmur heard. Pulmonary/Chest: Effort normal and breath sounds normal. No stridor. No respiratory distress. She has no wheezes. She has no rales. She exhibits no tenderness.  Abdominal: Soft. Normal appearance and bowel sounds are normal. She exhibits no distension, no abdominal bruit, no ascites and no mass. There is no hepatosplenomegaly. There is no tenderness. There is no rigidity, no rebound and no guarding. No hernia.  Genitourinary: Rectum normal, vagina normal and uterus normal. No breast swelling, tenderness, discharge or bleeding. Cervix exhibits no motion tenderness, no discharge and no friability. Right adnexum displays no mass, no tenderness and no fullness. Left adnexum displays no mass, no tenderness and no fullness. No erythema, tenderness or bleeding around the vagina. No vaginal discharge found.  Musculoskeletal: Normal range of motion. She exhibits no edema and no tenderness.  Lymphadenopathy:    She has no cervical adenopathy.  Neurological: She is alert. She has normal reflexes. No cranial nerve deficit. She exhibits normal muscle tone. Coordination normal.  Skin: Skin is warm and dry. No rash noted. She is  not diaphoretic.  No erythema. No pallor.  Psychiatric: She has a normal mood and affect. Her behavior is normal. Judgment and thought content normal.          Assessment & Plan:  Well exam. She needs to exercise and lose weight. Will refer her to GYN for the menorrhagia to discuss options like changing OCP or even endometrial ablation.

## 2011-04-01 NOTE — Progress Notes (Signed)
Addended by: Aniceto Boss A on: 04/01/2011 10:24 AM   Modules accepted: Orders

## 2011-04-04 NOTE — Progress Notes (Signed)
Quick Note:  Spoke with pt ______ 

## 2011-04-08 ENCOUNTER — Telehealth: Payer: Self-pay | Admitting: Family Medicine

## 2011-04-08 DIAGNOSIS — J329 Chronic sinusitis, unspecified: Secondary | ICD-10-CM

## 2011-04-08 NOTE — Telephone Encounter (Signed)
Left voice message with below information. 

## 2011-04-08 NOTE — Telephone Encounter (Signed)
I did the referal. Camelia Eng will call with details

## 2011-04-08 NOTE — Telephone Encounter (Signed)
Pt went to the ENT referral as suggested re: sinus issues. The ENT is referring pt to Neurologist, because pts headaches are migraines.  The ENT told pt that she needs referral to Allergist re: sinus issues, because sinuses are fine, probably allergy related. Pls do referral for Allergist.

## 2011-05-01 ENCOUNTER — Ambulatory Visit (INDEPENDENT_AMBULATORY_CARE_PROVIDER_SITE_OTHER): Payer: BC Managed Care – PPO | Admitting: Family

## 2011-05-01 ENCOUNTER — Encounter: Payer: Self-pay | Admitting: Family

## 2011-05-01 DIAGNOSIS — H6980 Other specified disorders of Eustachian tube, unspecified ear: Secondary | ICD-10-CM

## 2011-05-01 DIAGNOSIS — J309 Allergic rhinitis, unspecified: Secondary | ICD-10-CM

## 2011-05-01 MED ORDER — METHYLPREDNISOLONE ACETATE 80 MG/ML IJ SUSP
80.0000 mg | Freq: Once | INTRAMUSCULAR | Status: AC
  Administered 2011-05-01: 80 mg via INTRAMUSCULAR

## 2011-05-01 NOTE — Progress Notes (Signed)
Subjective:    Patient ID: Zoe [name], female    DOB: 1970/10/12, 41 y.o.   MRN: [id number]  HPI And 41 year old and white female, nonsmoker, patient of Dr. Clent Ridges is in today with complaints of dizziness when she went from a sitting to a standing position while at school today. Her symptoms has resolved for the most part continues to feel slightly lightheaded. She has a history of allergic rhinitis and was recently started on Xyzal 5 mg, Singulair 10 mg, and Nasonex. She is seeing [facility] and also getting allergy injections. Patient denies any diaphoresis, shortness of breath, palpitations, chest pain or edema. The past medical history of any cardiac issues.   Review of Systems  Constitutional: Negative.   HENT: Positive for ear pain and congestion. Negative for sneezing and sinus pressure.        Left ear discomfort  Eyes: Negative.   Respiratory: Negative.  Negative for shortness of breath.   Cardiovascular: Negative.  Negative for chest pain and palpitations.  Musculoskeletal: Negative.   Skin: Negative.   Neurological: Positive for light-headedness. Negative for numbness and headaches.  Hematological: Negative.   Psychiatric/Behavioral: Negative.    Past Medical History  Diagnosis Date  . Allergy   . Anemia   . Chickenpox   . Osteoarthritis   . Menorrhagia   . Benign hematuria     worked up by Dr. Ihor Gully  . Horseshoe kidney     History   Social History  . Marital Status: Single    Spouse Name: N/A    Number of Children: N/A  . Years of Education: N/A   Occupational History  . Not on file.   Social History Main Topics  . Smoking status: Never Smoker   . Smokeless tobacco: Never Used  . Alcohol Use: No  . Drug Use: No  . Sexually Active: Not on file   Other Topics Concern  . Not on file   Social History Narrative  . No narrative on file    Past Surgical History  Procedure Date  . Cesarean section   . Tubal ligation   . Tympanostomy      Family History  Problem Relation Age of Onset  . Arthritis    . Coronary artery disease    . Depression    . Diabetes    . Hyperlipidemia    . Sudden death      Allergies  Allergen Reactions  . Penicillins     REACTION: hives    Current Outpatient Prescriptions on File Prior to Visit  Medication Sig Dispense Refill  . atorvastatin (LIPITOR) 40 MG tablet Take 1 tablet (40 mg total) by mouth daily.  30 tablet  11  . ferrous sulfate 325 (65 FE) MG tablet Take 1 tablet (325 mg total) by mouth daily with breakfast.  30 tablet  11  . fexofenadine (ALLEGRA) 180 MG tablet Take 180 mg by mouth daily.        Marland Kitchen HYDROcodone-acetaminophen (VICODIN) 5-500 MG per tablet Take 1 tablet by mouth every 6 (six) hours as needed for pain.  60 tablet  5  . methocarbamol (ROBAXIN) 750 MG tablet Take 750 mg by mouth 4 (four) times daily as needed.        . Norgestimate-Ethinyl Estradiol Triphasic (TRI-SPRINTEC) 0.18/0.215/0.25 MG-35 MCG tablet Take 1 tablet by mouth daily.  28 tablet  11   No current facility-administered medications on file prior to visit.    BP 128/82  Pulse 107  Temp(Src) 98.1 F (36.7 C) (Oral)  Wt 199 lb (90.266 kg)  SpO2 99%chart   Objective:   Physical Exam  Constitutional: She is oriented to person, place, and time. She appears well-developed and well-nourished.  HENT:  Nose: Nose normal.  Mouth/Throat: Oropharynx is clear and moist.       Obvious scar tissue noted to the right tympanic membrane. Left tympanic membrane has moderate fluid. No tenderness elicited to palpation of the tragus  Neck: Normal range of motion. Neck supple.  Cardiovascular: Normal rate, regular rhythm and normal heart sounds.   Pulmonary/Chest: Effort normal and breath sounds normal.  Neurological: She is alert and oriented to person, place, and time.  Skin: Skin is warm and dry.  Psychiatric: She has a normal mood and affect.          Assessment & Plan:  Assessment: Eustachian tube  dysfunction, dizziness, allergic rhinitis  Plan: Continue medications as directed by the allergy asthma specialist. Patient to followup as needed and and is scheduled. Depo-Medrol 80 mg IM x1.

## 2011-05-01 NOTE — Patient Instructions (Signed)
Barotitis Media Barotitis media is soreness (inflammation) of the area behind the eardrum (middle ear). This occurs when the auditory tube (Eustachian tube) leading from the back of the throat to the eardrum is blocked. When it is blocked air cannot move in and out of the middle ear to equalize pressure changes. These pressure changes come from changes in altitude when:  Flying.   Driving in the mountains.   Diving.  Problems are more likely to occur with pressure changes during times when you are congested as from:  Hay fever.   Upper respiratory infection.   A cold.  Damage or hearing loss (barotrauma) caused by this may be permanent. HOME CARE INSTRUCTIONS   Use medicines as recommended by your caregiver. Over the counter medicines will help unblock the canal and can help during times of air travel.   Do not put anything into your ears to clean or unplug them. Eardrops will not be helpful.   Do not swim, dive, or fly until your caregiver says it is all right to do so. If these activities are necessary, chewing gum with frequent swallowing may help. It is also helpful to hold your nose and gently blow to pop your ears for equalizing pressure changes. This forces air into the Eustachian tube.   For little ones with problems, give your baby a bottle of water or juice during periods when pressure changes would be anticipated such as during take offs and landings associated with air travel.   Only take over-the-counter or prescription medicines for pain, discomfort, or fever as directed by your caregiver.   A decongestant may be helpful in de-congesting the middle ear and make pressure equalization easier. This can be even more effective if the drops (spray) are delivered with the head lying over the edge of a bed with the head tilted toward the ear on the affected side.   If your caregiver has given you a follow-up appointment, it is very important to keep that appointment. Not keeping  the appointment could result in a chronic or permanent injury, pain, hearing loss and disability. If there is any problem keeping the appointment, you must call back to this facility for assistance.  SEEK IMMEDIATE MEDICAL CARE IF:   You develop a severe headache, dizziness, severe ear pain, or bloody or pus-like drainage from your ears.   An oral temperature above 102 F (38.9 C) develops.   Your problems do not improve or become worse.  MAKE SURE YOU:   Understand these instructions.   Will watch your condition.   Will get help right away if you are not doing well or get worse.  Document Released: 02/08/2000 Document Revised: 01/30/2011 Document Reviewed: 09/16/2007 [employer] Patient Information 2012 [employer]. 

## 2011-05-13 IMAGING — US US ABDOMEN COMPLETE
1 series · 13 of 25 positions shown · non-contrast
Comparison: None

CLINICAL DATA: Right upper quadrant and epigastric abdominal pain;
back pain.

ABDOMINAL ULTRASOUND COMPLETE

[Series 1: us abdomen complete · 0.32mm/px · 13 of 53 slices shown]
[im 1/53]
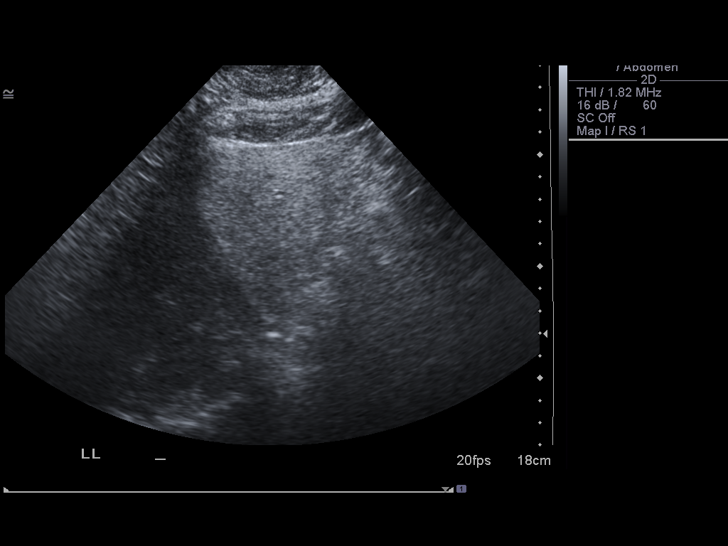
[im 5/53]
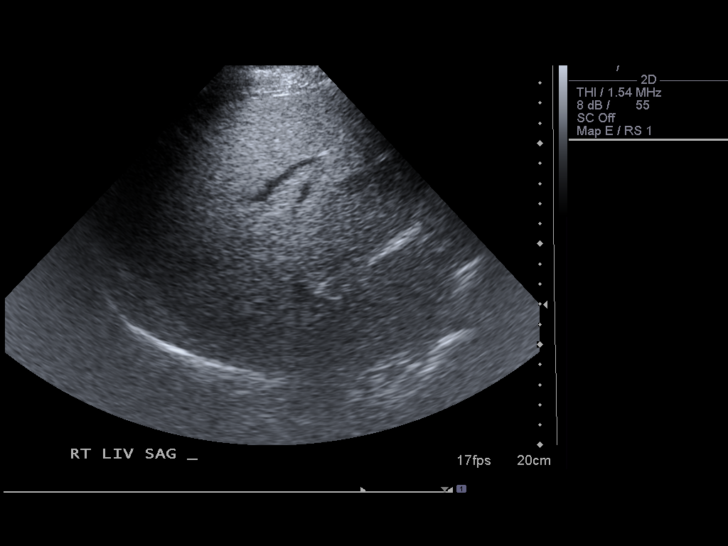
[im 9/53]
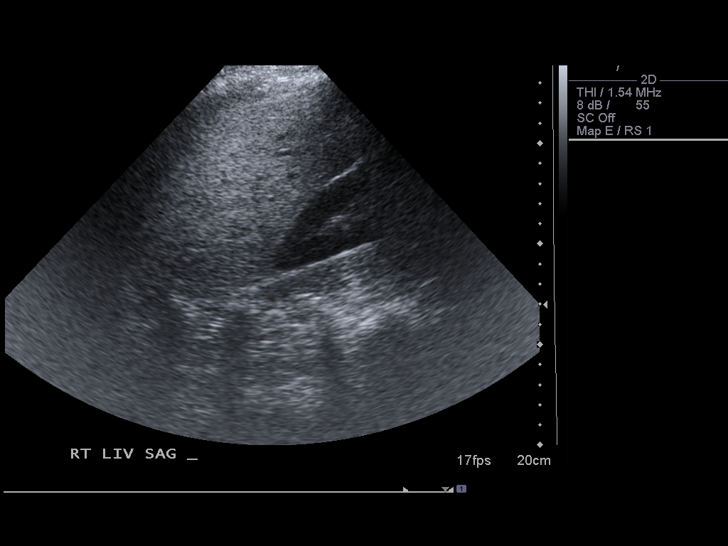
[im 14/53]
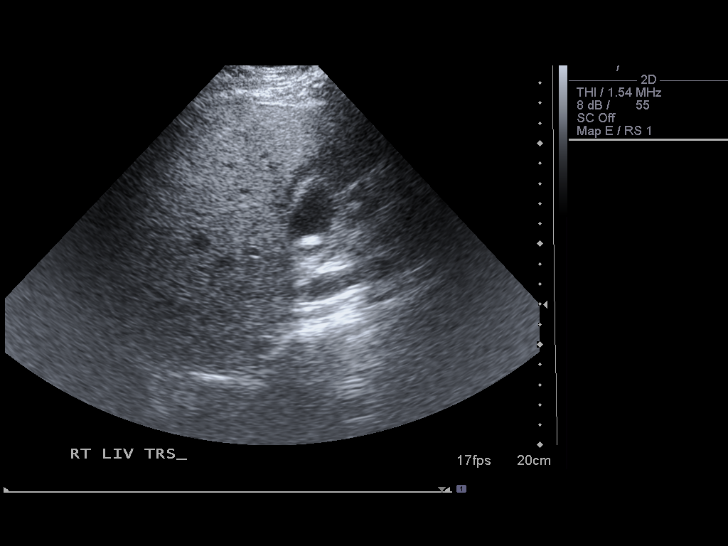
[im 18/53]
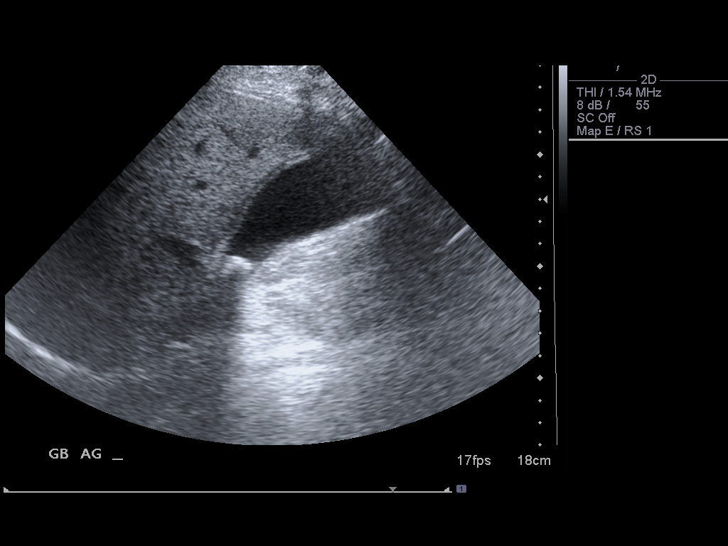
[im 22/53]
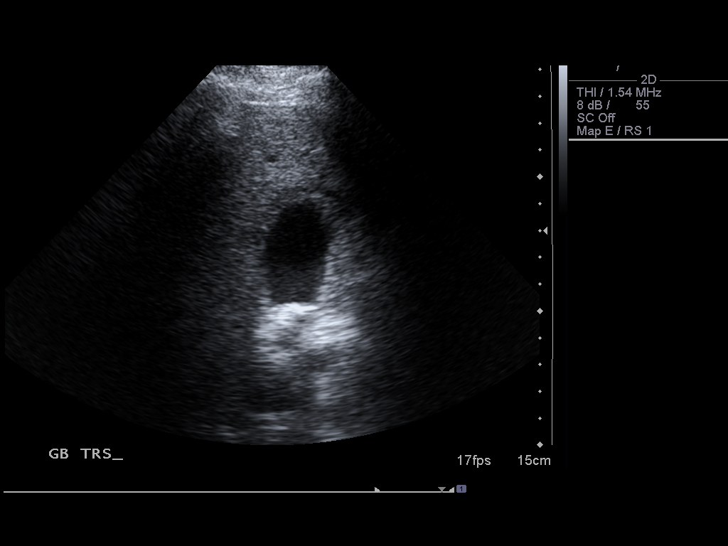
[im 27/53]
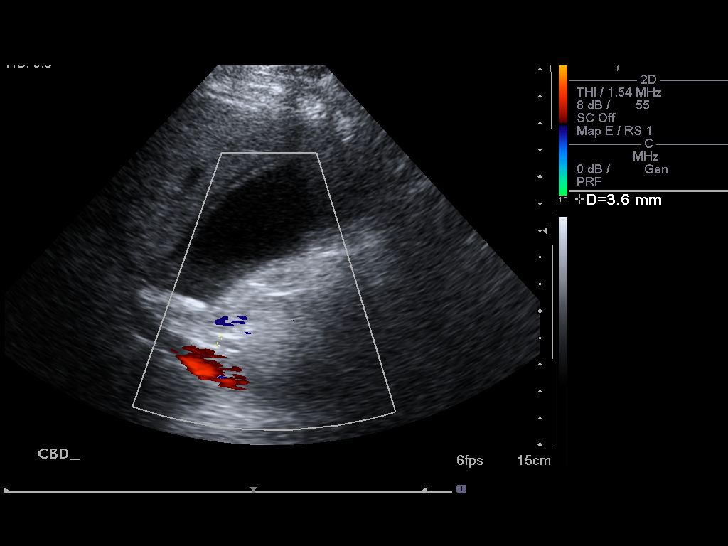
[im 31/53]
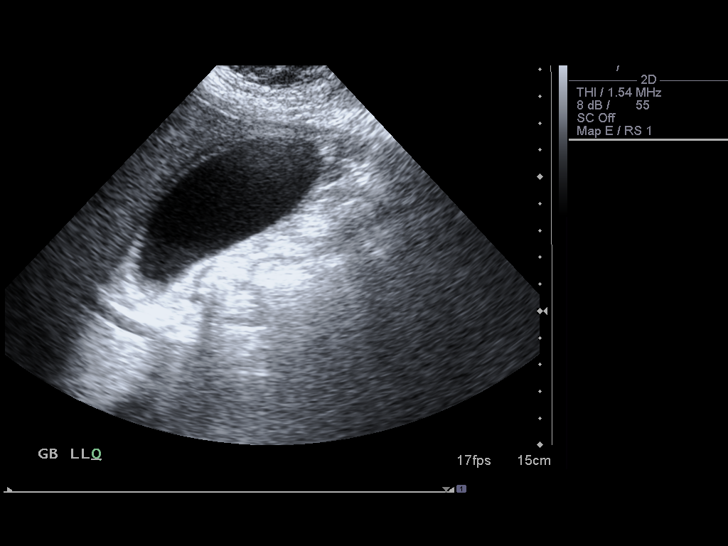
[im 35/53]
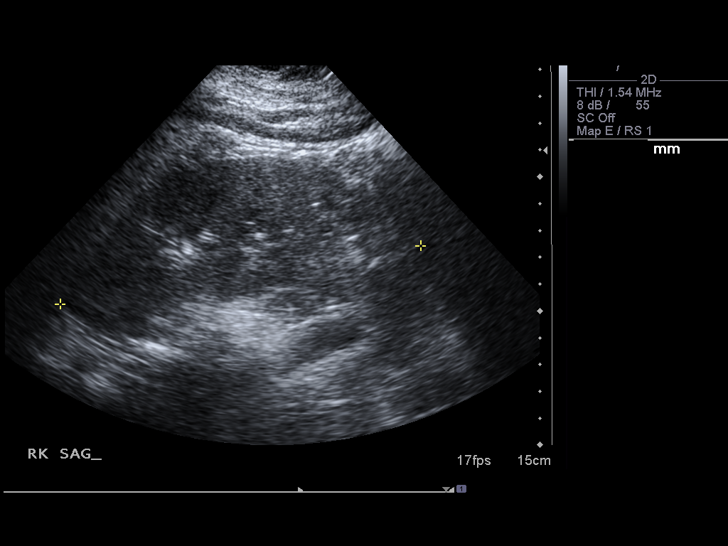
[im 40/53]
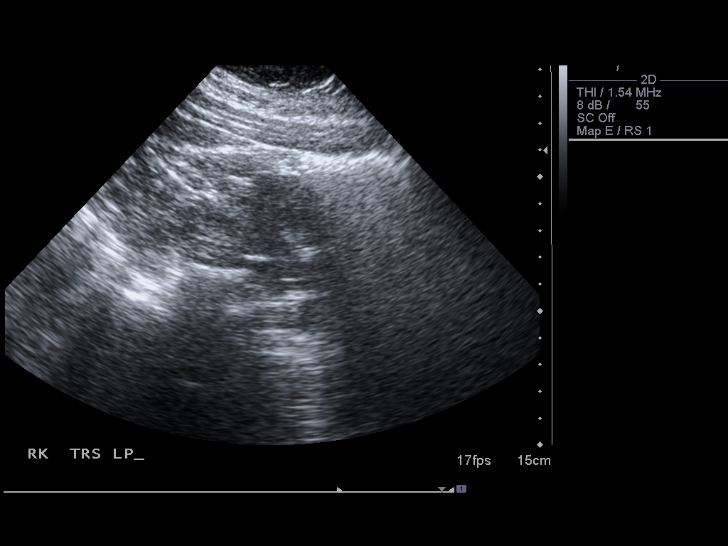
[im 44/53]
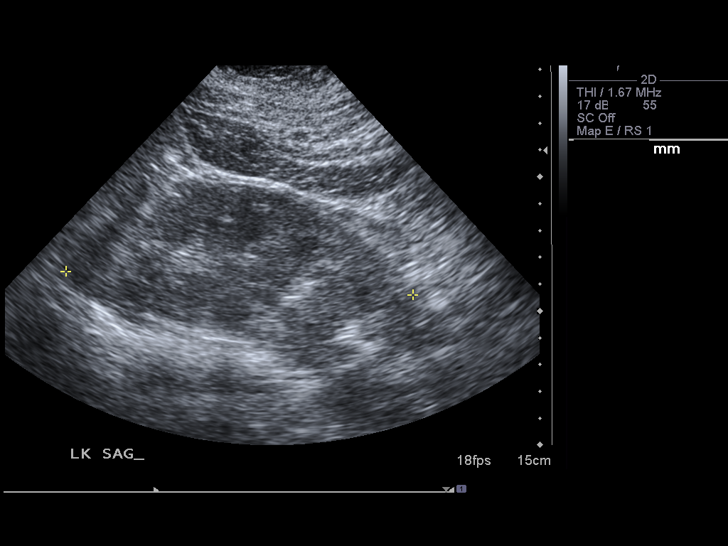
[im 48/53]
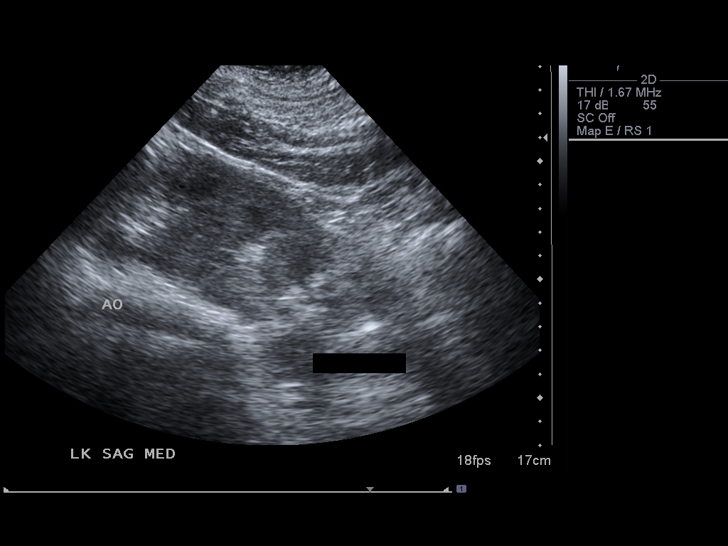
[im 53/53]
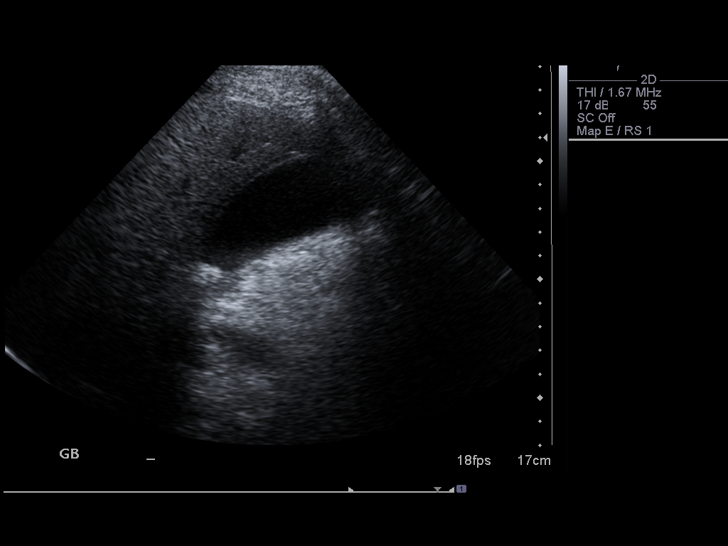

[13 of 25 positions shown; findings below may reference images not displayed]

FINDINGS: Gallbladder:  A single nonmobile 1.2 cm stone is identified lodged
at the neck of the gallbladder.  There is mild edema along the
gallbladder wall, which is mildly thickened at 0.4 cm.  However, no
ultrasonographic Murphy's sign is elicited; no pericholecystic
fluid is seen.

Common Bile Duct:  0.4 cm in diameter; within normal limits in
caliber.

Liver:  Diffusely increased echogenicity and coarsened echotexture,
compatible with fatty infiltration; no focal lesions identified.
Limited Doppler evaluation demonstrates normal blood flow within
the liver.

IVC:  Unremarkable in appearance.

Pancreas:  Although the pancreas is difficult to visualize in its
entirety due to overlying bowel gas, no focal pancreatic
abnormality is identified.

Spleen:  11.7 cm in length; within normal limits in size and
echotexture.

Right kidney:  13.6 cm in length; the configuration of the right
kidney is highly suspicious for a horseshoe kidney.  Parenchymal
echogenicity is within normal limits.  No evidence of mass or
hydronephrosis.

Left kidney:  13.0 cm in length; the configuration of the left
kidney is highly suspicious for a horseshoe kidney.  Parenchymal
echogenicity is within normal limits.  No evidence of mass or
hydronephrosis.

Abdominal Aorta:  Normal in caliber; no aneurysm identified.
IMPRESSION: 1.  Single non mobile 1.2 cm stone lodged at the neck of the
gallbladder.  Mild gallbladder wall edema, without evidence of
pericholecystic fluid.  No ultrasonographic Murphy's sign seen to
suggest cholecystitis or obstruction.
2.  Likely horseshoe kidney noted.  No evidence of hydronephrosis.

## 2011-05-13 IMAGING — CR DG CHEST 2V
2 series · 2 of 2 positions shown · non-contrast
Comparison: None.

CLINICAL DATA: Right upper quadrant abdominal pain and back pain.

CHEST - 2 VIEW

[w chest pa]
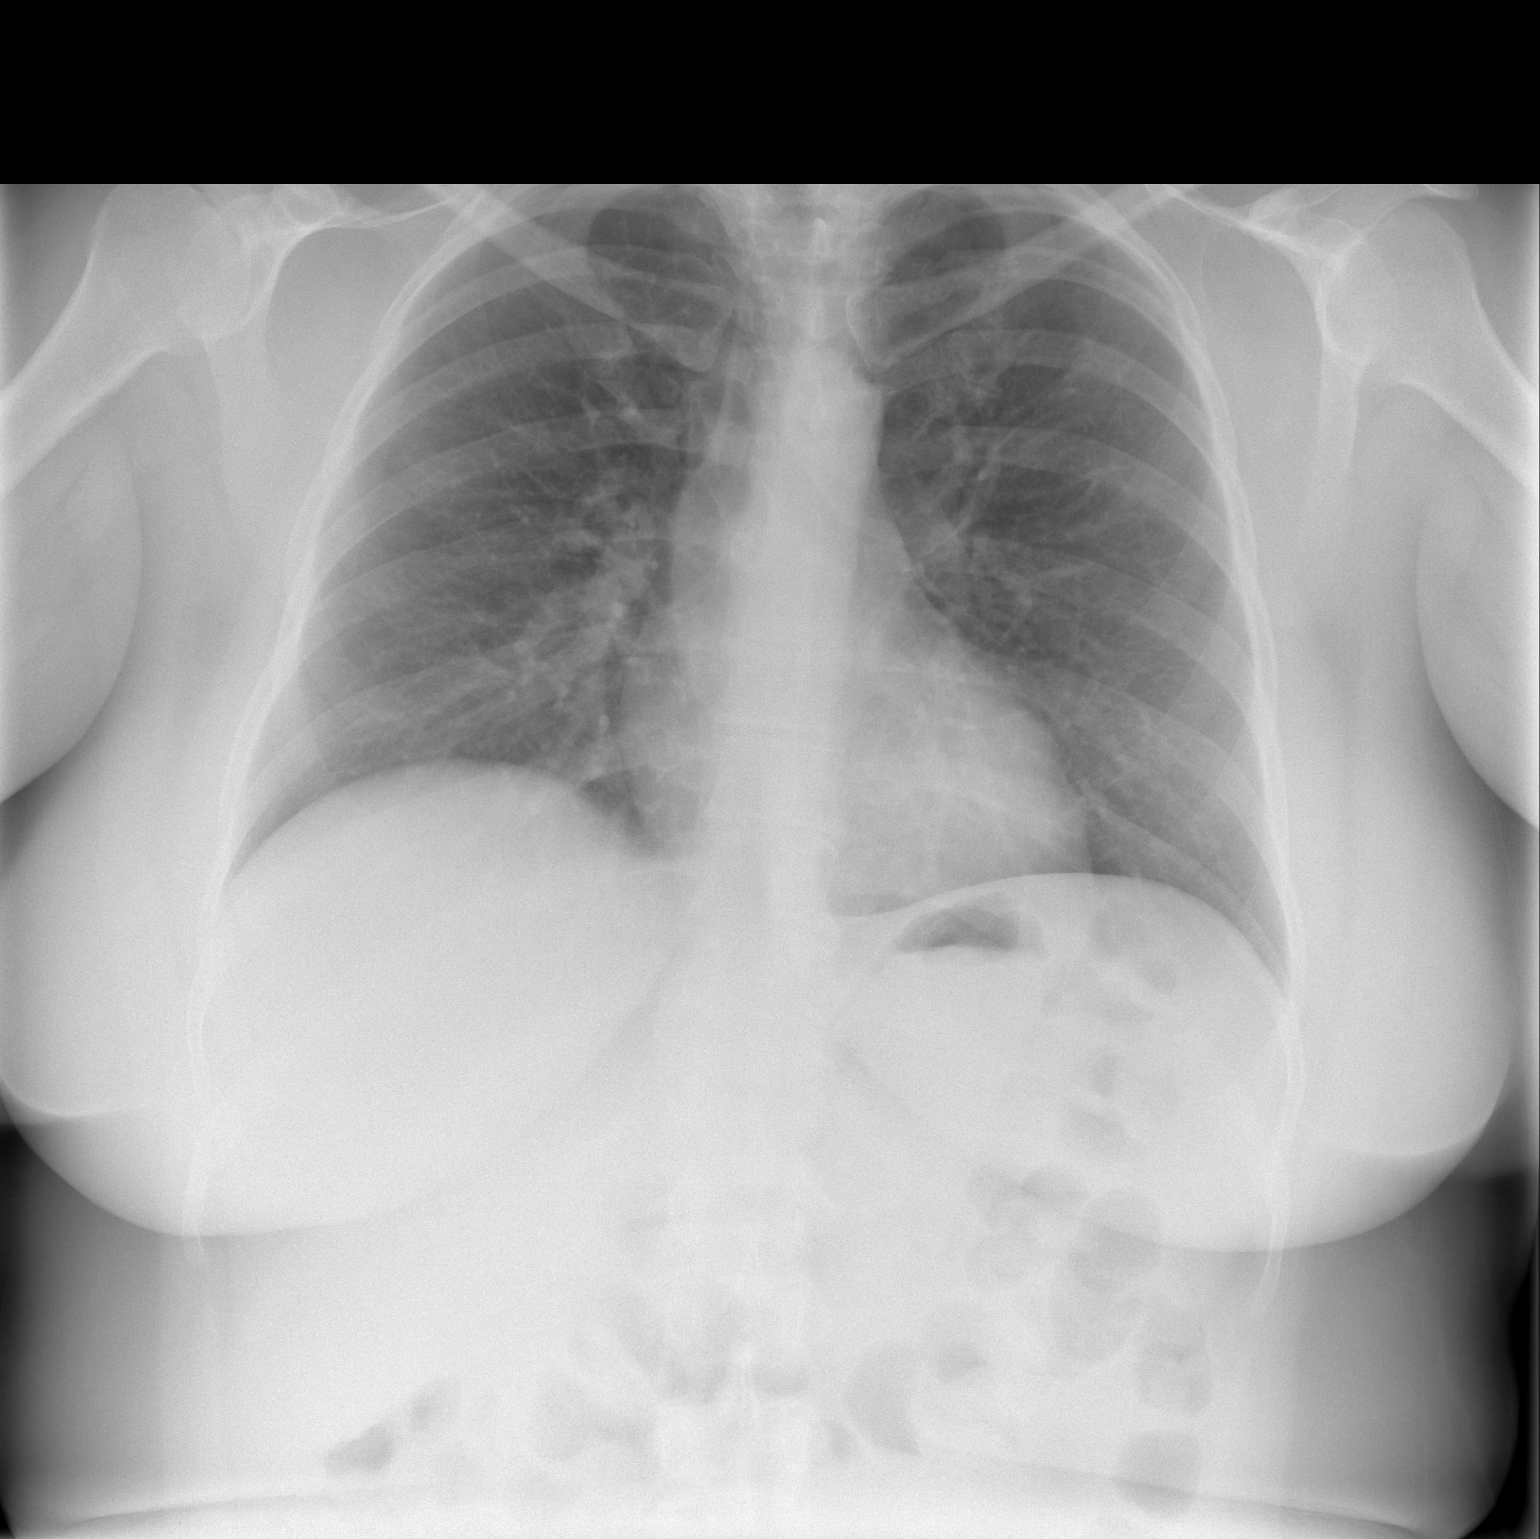

[w chest lat]
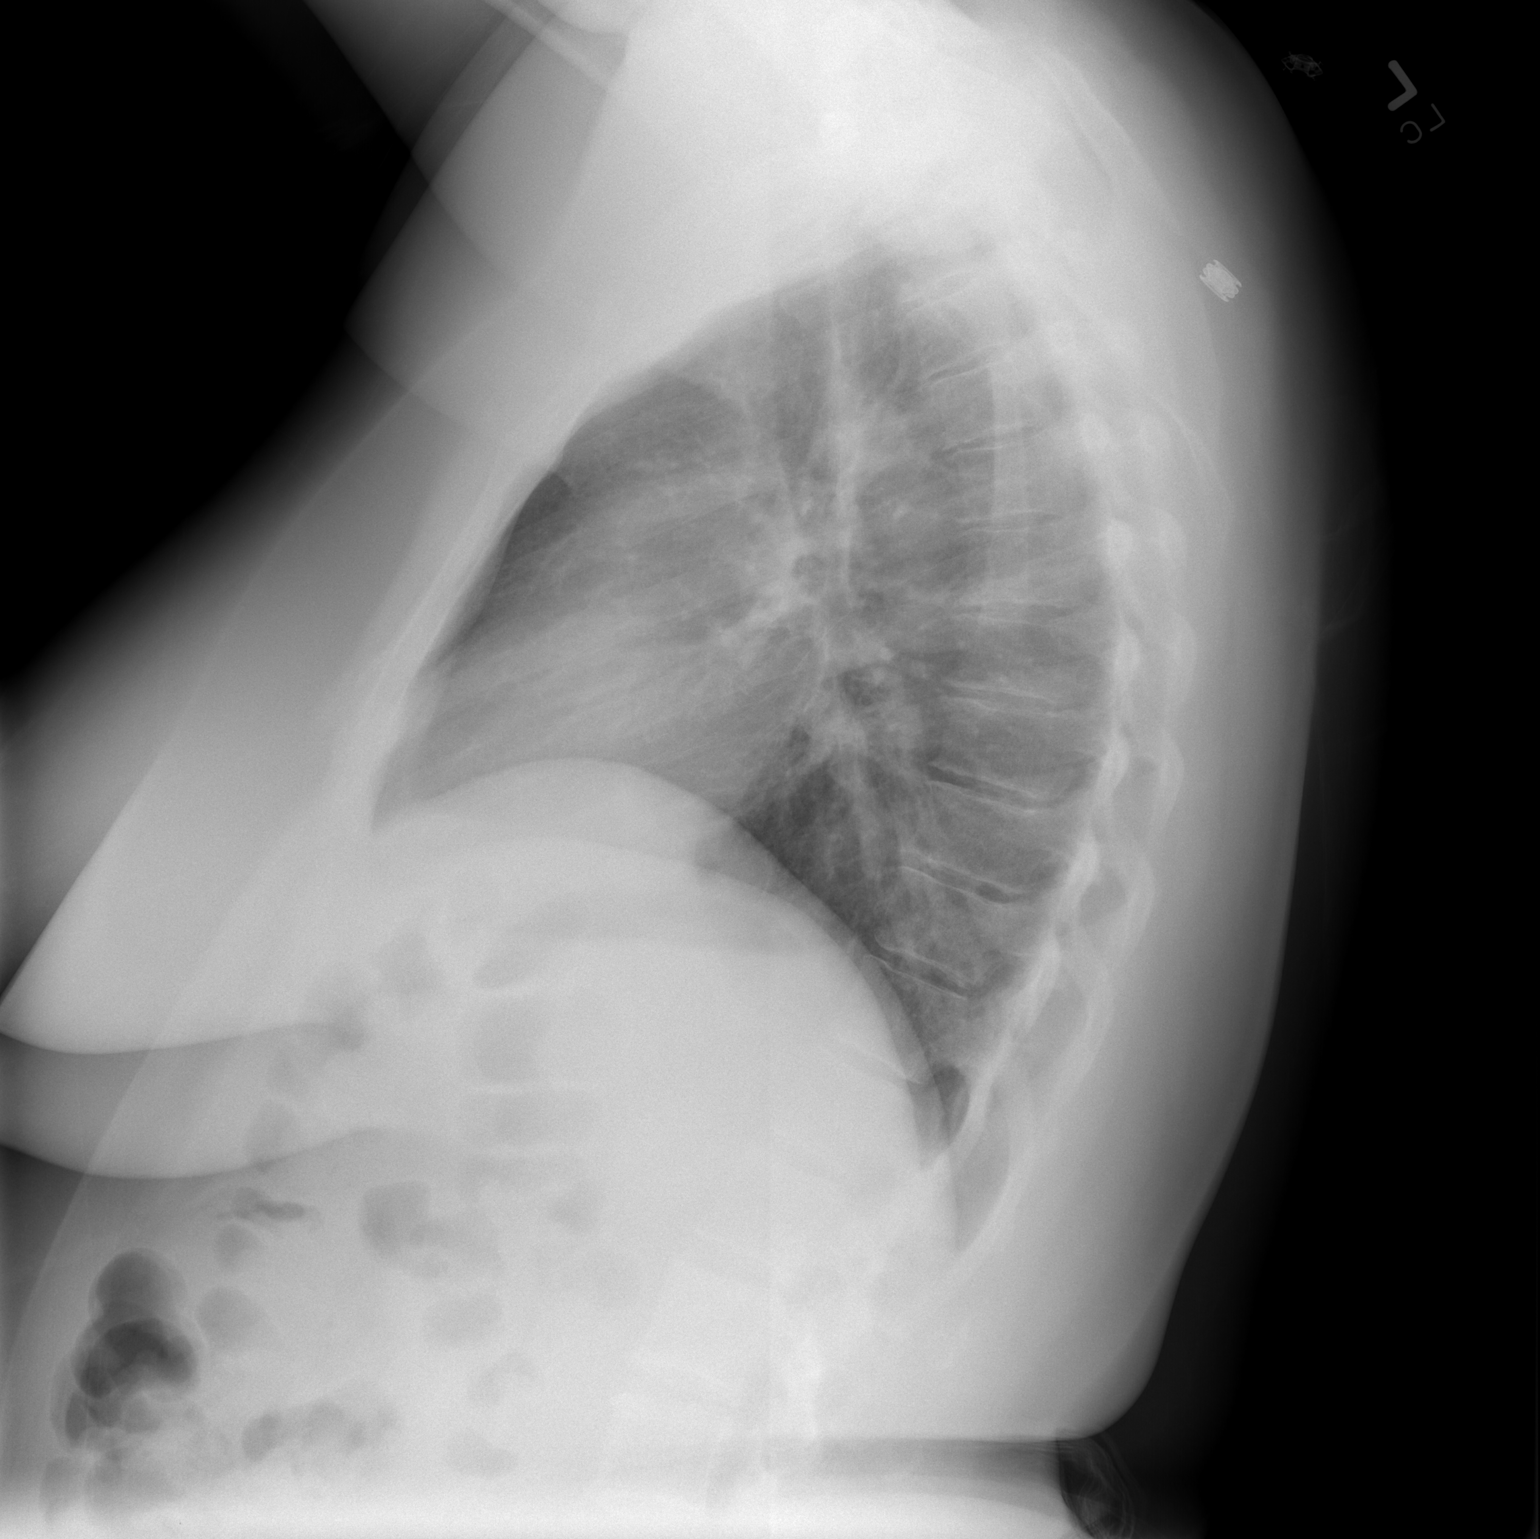

[2 of 2 positions shown; findings below may reference images not displayed]

FINDINGS: The lungs are well-aerated and clear.  There is no
evidence of focal opacification, pleural effusion or pneumothorax.

The heart is normal in size; the mediastinal contour is within
normal limits.  No acute osseous abnormalities are seen.
IMPRESSION: No acute cardiopulmonary process seen.

## 2011-05-19 ENCOUNTER — Encounter: Payer: Self-pay | Admitting: Family Medicine

## 2011-05-19 ENCOUNTER — Ambulatory Visit (INDEPENDENT_AMBULATORY_CARE_PROVIDER_SITE_OTHER): Payer: BC Managed Care – PPO | Admitting: Family Medicine

## 2011-05-19 VITALS — BP 128/74 | HR 126 | Temp 99.1°F | Wt 198.0 lb

## 2011-05-19 DIAGNOSIS — J40 Bronchitis, not specified as acute or chronic: Secondary | ICD-10-CM

## 2011-05-19 MED ORDER — HYDROCODONE-HOMATROPINE 5-1.5 MG/5ML PO SYRP: 5.0000 mL | ORAL_SOLUTION | ORAL | Status: AC | PRN

## 2011-05-19 MED ORDER — AZITHROMYCIN 250 MG PO TABS: ORAL_TABLET | ORAL | Status: AC

## 2011-05-19 NOTE — Progress Notes (Signed)
  Subjective:    Patient ID: Zoe [name], female    DOB: 09/11/1970, 41 y.o.   MRN: [id number]  HPI Here for 3 days of PND, chest burning and coughing up green sputum.   Review of Systems  Constitutional: Negative.   HENT: Positive for congestion and postnasal drip.   Eyes: Negative.   Respiratory: Positive for cough.        Objective:   Physical Exam  Constitutional: She appears well-developed and well-nourished.  HENT:  Right Ear: External ear normal.  Left Ear: External ear normal.  Nose: Nose normal.  Mouth/Throat: Oropharynx is clear and moist. No oropharyngeal exudate.  Eyes: Conjunctivae are normal.  Pulmonary/Chest: Effort normal and breath sounds normal.  Lymphadenopathy:    She has no cervical adenopathy.          Assessment & Plan:  Out of work today and tomorrow

## 2011-05-21 ENCOUNTER — Telehealth: Payer: Self-pay | Admitting: Family Medicine

## 2011-05-21 NOTE — Telephone Encounter (Signed)
Pt said that Dr Clent Ridges had written pt out of work for 3/25 and 3/26, but pt was out today today 05/21/11 too, so she is req to a get a letter out for work today and to return to work on [employer] 05/22/11. Pts daughter will pick up note tomorrow 05/22/11.

## 2011-05-22 NOTE — Telephone Encounter (Signed)
Note is ready and spoke with pt.

## 2011-05-22 NOTE — Telephone Encounter (Signed)
Okay to do this. 

## 2011-09-03 ENCOUNTER — Other Ambulatory Visit: Payer: Self-pay | Admitting: Family Medicine

## 2011-09-03 DIAGNOSIS — Z1231 Encounter for screening mammogram for malignant neoplasm of breast: Secondary | ICD-10-CM

## 2011-10-02 ENCOUNTER — Ambulatory Visit
Admission: RE | Admit: 2011-10-02 | Discharge: 2011-10-02 | Disposition: A | Discharge status: Home / Self Care | Payer: BC Managed Care – PPO | Source: Ambulatory Visit | Attending: Family Medicine | Admitting: Family Medicine

## 2011-10-02 DIAGNOSIS — Z1231 Encounter for screening mammogram for malignant neoplasm of breast: Secondary | ICD-10-CM

## 2011-11-20 ENCOUNTER — Encounter: Payer: Self-pay | Admitting: Family Medicine

## 2011-11-20 ENCOUNTER — Ambulatory Visit (INDEPENDENT_AMBULATORY_CARE_PROVIDER_SITE_OTHER): Payer: BC Managed Care – PPO | Admitting: Family Medicine

## 2011-11-20 VITALS — BP 118/70 | HR 98 | Temp 98.5°F | Wt 208.0 lb

## 2011-11-20 DIAGNOSIS — J329 Chronic sinusitis, unspecified: Secondary | ICD-10-CM

## 2011-11-20 MED ORDER — CEFUROXIME AXETIL 500 MG PO TABS: 500.0000 mg | ORAL_TABLET | Freq: Two times a day (BID) | ORAL | Status: DC

## 2011-11-20 NOTE — Progress Notes (Signed)
  Subjective:    Patient ID: Zoe [name], female    DOB: 05/20/1970, 41 y.o.   MRN: [id number]  HPI Here for 3 days of sinus pressure, HA, and PND. No fever or cough. On Mucinex.   Review of Systems  Constitutional: Negative.   HENT: Positive for congestion, postnasal drip and sinus pressure.   Eyes: Negative.   Respiratory: Negative.        Objective:   Physical Exam  Constitutional: She appears well-developed and well-nourished.  HENT:  Right Ear: External ear normal.  Left Ear: External ear normal.  Nose: Nose normal.  Mouth/Throat: Oropharynx is clear and moist.  Eyes: Conjunctivae normal are normal.  Pulmonary/Chest: Effort normal and breath sounds normal.  Lymphadenopathy:    She has no cervical adenopathy.          Assessment & Plan:  Out of school today.

## 2012-04-07 ENCOUNTER — Other Ambulatory Visit (INDEPENDENT_AMBULATORY_CARE_PROVIDER_SITE_OTHER): Payer: BC Managed Care – PPO

## 2012-04-07 DIAGNOSIS — Z0000 Encounter for general adult medical examination without abnormal findings: Secondary | ICD-10-CM

## 2012-04-07 LAB — POCT URINALYSIS DIPSTICK
Bilirubin, UA: NEGATIVE
Glucose, UA: NEGATIVE
Ketones, UA: NEGATIVE
Nitrite, UA: NEGATIVE
Spec Grav, UA: 1.02
Urobilinogen, UA: 0.2
pH, UA: 5.5

## 2012-04-07 LAB — LIPID PANEL
Cholesterol: 136 mg/dL (ref 0–200)
HDL: 38.5 mg/dL — ABNORMAL LOW (ref >39.00)
LDL Cholesterol: 75 mg/dL (ref 0–99)
Total CHOL/HDL Ratio: 4
Triglycerides: 112 mg/dL (ref 0.0–149.0)
VLDL: 22.4 mg/dL (ref 0.0–40.0)

## 2012-04-07 LAB — CBC WITH DIFFERENTIAL/PLATELET
Basophils Absolute: 0.1 10*3/uL (ref 0.0–0.1)
Basophils Relative: 0.5 % (ref 0.0–3.0)
Eosinophils Absolute: 0.3 10*3/uL (ref 0.0–0.7)
Eosinophils Relative: 2.2 % (ref 0.0–5.0)
HCT: 40.7 % (ref 36.0–46.0)
Hemoglobin: 13.9 g/dL (ref 12.0–15.0)
Lymphocytes Relative: 24.3 % (ref 12.0–46.0)
Lymphs Abs: 2.8 10*3/uL (ref 0.7–4.0)
MCHC: 34 g/dL (ref 30.0–36.0)
MCV: 88.7 fl (ref 78.0–100.0)
Monocytes Absolute: 0.8 10*3/uL (ref 0.1–1.0)
Monocytes Relative: 6.8 % (ref 3.0–12.0)
Neutro Abs: 7.7 10*3/uL (ref 1.4–7.7)
Neutrophils Relative %: 66.2 % (ref 43.0–77.0)
Platelets: 298 10*3/uL (ref 150.0–400.0)
RBC: 4.59 Mil/uL (ref 3.87–5.11)
RDW: 14.1 % (ref 11.5–14.6)
WBC: 11.6 10*3/uL — ABNORMAL HIGH (ref 4.5–10.5)

## 2012-04-07 LAB — HEPATIC FUNCTION PANEL
ALT: 38 U/L — ABNORMAL HIGH (ref 0–35)
AST: 30 U/L (ref 0–37)
Albumin: 3.6 g/dL (ref 3.5–5.2)
Alkaline Phosphatase: 100 U/L (ref 39–117)
Bilirubin, Direct: 0.1 mg/dL (ref 0.0–0.3)
Total Bilirubin: 0.5 mg/dL (ref 0.3–1.2)
Total Protein: 7.6 g/dL (ref 6.0–8.3)

## 2012-04-07 LAB — TSH: TSH: 3.85 u[IU]/mL (ref 0.35–5.50)

## 2012-04-07 LAB — BASIC METABOLIC PANEL
BUN: 9 mg/dL (ref 6–23)
CO2: 25 mEq/L (ref 19–32)
Calcium: 8.7 mg/dL (ref 8.4–10.5)
Chloride: 103 mEq/L (ref 96–112)
Creatinine, Ser: 0.7 mg/dL (ref 0.4–1.2)
GFR: 104.15 mL/min (ref >60.00)
Glucose, Bld: 92 mg/dL (ref 70–99)
Potassium: 4 mEq/L (ref 3.5–5.1)
Sodium: 135 mEq/L (ref 135–145)

## 2012-04-12 ENCOUNTER — Ambulatory Visit (INDEPENDENT_AMBULATORY_CARE_PROVIDER_SITE_OTHER): Payer: BC Managed Care – PPO | Admitting: Family Medicine

## 2012-04-12 ENCOUNTER — Encounter: Payer: Self-pay | Admitting: Family Medicine

## 2012-04-12 VITALS — BP 122/84 | HR 104 | Temp 98.2°F | Ht 60.25 in | Wt 211.0 lb

## 2012-04-12 DIAGNOSIS — Z0000 Encounter for general adult medical examination without abnormal findings: Secondary | ICD-10-CM

## 2012-04-12 MED ORDER — PHENTERMINE HCL 37.5 MG PO CAPS: 37.5000 mg | ORAL_CAPSULE | ORAL | Status: DC

## 2012-04-12 NOTE — Progress Notes (Signed)
  Subjective:    Patient ID: Zoe [name], female    DOB: 11-20-1970, 42 y.o.   MRN: [id number]  HPI 42 yr old female for a cpx. She feels well but asks for help in losing weight. She is working out 4 days a week but finds it very difficult to control her eating.    Review of Systems  Constitutional: Negative.   HENT: Negative.   Eyes: Negative.   Respiratory: Negative.   Cardiovascular: Negative.   Gastrointestinal: Negative.   Genitourinary: Negative for dysuria, urgency, frequency, hematuria, flank pain, decreased urine volume, enuresis, difficulty urinating, pelvic pain and dyspareunia.  Musculoskeletal: Negative.   Skin: Negative.   Neurological: Negative.   Psychiatric/Behavioral: Negative.        Objective:   Physical Exam  Constitutional: She is oriented to person, place, and time. She appears well-developed and well-nourished. No distress.  HENT:  Head: Normocephalic and atraumatic.  Right Ear: External ear normal.  Left Ear: External ear normal.  Nose: Nose normal.  Mouth/Throat: Oropharynx is clear and moist. No oropharyngeal exudate.  Eyes: Conjunctivae and EOM are normal. Pupils are equal, round, and reactive to light. No scleral icterus.  Neck: Normal range of motion. Neck supple. No JVD present. No thyromegaly present.  Cardiovascular: Normal rate, regular rhythm, normal heart sounds and intact distal pulses.  Exam reveals no gallop and no friction rub.   No murmur heard. Pulmonary/Chest: Effort normal and breath sounds normal. No respiratory distress. She has no wheezes. She has no rales. She exhibits no tenderness.  Abdominal: Soft. Bowel sounds are normal. She exhibits no distension and no mass. There is no tenderness. There is no rebound and no guarding.  Musculoskeletal: Normal range of motion. She exhibits no edema and no tenderness.  Lymphadenopathy:    She has no cervical adenopathy.  Neurological: She is alert and oriented to person, place, and  time. She has normal reflexes. No cranial nerve deficit. She exhibits normal muscle tone. Coordination normal.  Skin: Skin is warm and dry. No rash noted. No erythema.  Psychiatric: She has a normal mood and affect. Her behavior is normal. Judgment and thought content normal.          Assessment & Plan:  Well exam. Try Phentermine for a few months.

## 2012-04-14 NOTE — Progress Notes (Signed)
Quick Note:  I left voice message with results. ______ 

## 2012-04-19 ENCOUNTER — Other Ambulatory Visit: Payer: Self-pay | Admitting: Family Medicine

## 2012-06-01 ENCOUNTER — Ambulatory Visit (INDEPENDENT_AMBULATORY_CARE_PROVIDER_SITE_OTHER): Payer: BC Managed Care – PPO | Admitting: Family Medicine

## 2012-06-01 ENCOUNTER — Encounter: Payer: Self-pay | Admitting: Family Medicine

## 2012-06-01 VITALS — BP 130/80 | HR 88 | Temp 98.2°F | Wt 202.0 lb

## 2012-06-01 DIAGNOSIS — J019 Acute sinusitis, unspecified: Secondary | ICD-10-CM

## 2012-06-01 MED ORDER — CEFUROXIME AXETIL 500 MG PO TABS: 500.0000 mg | ORAL_TABLET | Freq: Two times a day (BID) | ORAL | Status: DC

## 2012-06-01 NOTE — Progress Notes (Signed)
  Subjective:    Patient ID: Zoe [name], female    DOB: 19-Apr-1970, 42 y.o.   MRN: [id number]  HPI Here for one week of HAs around the eyes, sinus pressure, PND, and a dry cough. No fever.    Review of Systems  Constitutional: Negative.   HENT: Positive for congestion, postnasal drip and sinus pressure.   Eyes: Negative.   Respiratory: Positive for cough.        Objective:   Physical Exam  Constitutional: She appears well-developed and well-nourished.  HENT:  Right Ear: External ear normal.  Left Ear: External ear normal.  Nose: Nose normal.  Mouth/Throat: Oropharynx is clear and moist.  Eyes: Conjunctivae are normal.  Pulmonary/Chest: Effort normal and breath sounds normal.  Lymphadenopathy:    She has no cervical adenopathy.          Assessment & Plan:  Add Mucinex

## 2012-07-08 ENCOUNTER — Other Ambulatory Visit: Payer: Self-pay | Admitting: Neurology

## 2012-07-09 ENCOUNTER — Ambulatory Visit (INDEPENDENT_AMBULATORY_CARE_PROVIDER_SITE_OTHER): Payer: BC Managed Care – PPO | Admitting: Family Medicine

## 2012-07-09 ENCOUNTER — Encounter: Payer: Self-pay | Admitting: Family Medicine

## 2012-07-09 DIAGNOSIS — T6391XA Toxic effect of contact with unspecified venomous animal, accidental (unintentional), initial encounter: Secondary | ICD-10-CM

## 2012-07-09 DIAGNOSIS — T63391A Toxic effect of venom of other spider, accidental (unintentional), initial encounter: Secondary | ICD-10-CM

## 2012-07-09 DIAGNOSIS — G4700 Insomnia, unspecified: Secondary | ICD-10-CM

## 2012-07-09 MED ORDER — TEMAZEPAM 30 MG PO CAPS: 30.0000 mg | ORAL_CAPSULE | Freq: Every evening | ORAL | Status: DC | PRN

## 2012-07-09 MED ORDER — DOXYCYCLINE HYCLATE 100 MG PO CAPS: 100.0000 mg | ORAL_CAPSULE | Freq: Two times a day (BID) | ORAL | Status: AC

## 2012-07-09 NOTE — Progress Notes (Signed)
  Subjective:    Patient ID: Zoe [name], female    DOB: 05-23-1970, 42 y.o.   MRN: [id number]  HPI Here for an apparent insect bite that she noticed at work last night. She had the onset of pain and swelling in the right cheek. No fever or systemic problems. Also she has frequent trouble sleeping.    Review of Systems  Constitutional: Negative.   HENT: Positive for facial swelling. Negative for neck pain and neck stiffness.   Eyes: Negative.   Psychiatric/Behavioral: Positive for sleep disturbance. Negative for dysphoric mood. The patient is not nervous/anxious.        Objective:   Physical Exam  Constitutional: She is oriented to person, place, and time. She appears well-developed and well-nourished.  HENT:  She has a tiny puncture wound on the right cheek with erythema, warmth, tenderness and induration around it.   Neck:  Tender nodes under the right mandible  Cardiovascular: Normal rate, regular rhythm, normal heart sounds and intact distal pulses.   Pulmonary/Chest: Effort normal and breath sounds normal.  Lymphadenopathy:    She has cervical adenopathy.  Neurological: She is alert and oriented to person, place, and time.  Psychiatric: She has a normal mood and affect. Her behavior is normal. Thought content normal.          Assessment & Plan:  Probable spider bite. Use ice packs on the wound and cover with Doxycycline. Try Temazepam for sleep.

## 2012-07-16 ENCOUNTER — Telehealth: Payer: Self-pay | Admitting: Nurse Practitioner

## 2012-08-03 ENCOUNTER — Other Ambulatory Visit: Payer: Self-pay | Admitting: Obstetrics and Gynecology

## 2012-08-26 ENCOUNTER — Encounter: Payer: Self-pay | Admitting: Internal Medicine

## 2012-09-21 ENCOUNTER — Encounter: Payer: Self-pay | Admitting: Internal Medicine

## 2012-09-22 ENCOUNTER — Encounter: Payer: Self-pay | Admitting: Internal Medicine

## 2012-09-24 ENCOUNTER — Encounter: Payer: Self-pay | Admitting: Internal Medicine

## 2012-09-24 ENCOUNTER — Ambulatory Visit (INDEPENDENT_AMBULATORY_CARE_PROVIDER_SITE_OTHER): Payer: BC Managed Care – PPO | Admitting: Internal Medicine

## 2012-09-24 VITALS — BP 118/80 | HR 88 | Ht 60.0 in | Wt 202.4 lb

## 2012-09-24 DIAGNOSIS — Z8371 Family history of colonic polyps: Secondary | ICD-10-CM

## 2012-09-24 DIAGNOSIS — Z1211 Encounter for screening for malignant neoplasm of colon: Secondary | ICD-10-CM

## 2012-09-24 MED ORDER — MOVIPREP 100 G PO SOLR: ORAL | Status: DC

## 2012-09-24 NOTE — Progress Notes (Signed)
Patient ID: Zoe [name], female   DOB: April 02, 1970, 42 y.o.   MRN: [id number] HPI: Zoe [name] is a 42 year old female with a past medical history migraines, osteoarthritis, benign hematuria, and now a family history of adenomatous colon polyps who is seen in consultation at the request of Dr. Clent Ridges for evaluation of elevated rescreening colonoscopy. She is alone today. She reports her brother who is 82 months old and she, had a colonoscopy (reasons for this test early unclear) and revealed an adenomatous colon polyp. This was done with Dr. Tanna Savoy, MD at [facility] in [place].  Dr. Tanna Savoy soon a letter recommending to her brother that all first-degree family members began having screening colonoscopy at age 104, then 5 years thereafter.  She reports no specific GI complaint. She does occasionally have constipation but this is not a problem for her. No abdominal pain, nausea, vomiting. No diarrhea.  No blood in her stool or melena. No weight loss. No change in her bowel habits.  Past Medical History  Diagnosis Date  . Allergy   . Anemia   . Chickenpox   . Osteoarthritis   . Menorrhagia   . Benign hematuria     worked up by Dr. Ihor Gully  . Horseshoe kidney     Past Surgical History  Procedure Laterality Date  . Cesarean section    . Tubal ligation    . Tympanostomy      Current Outpatient Prescriptions  Medication Sig Dispense Refill  . atorvastatin (LIPITOR) 40 MG tablet TAKE 1 TABLET BY MOUTH DAILY  30 tablet  11  . EPINEPHrine (EPI-PEN) 0.3 mg/0.3 mL DEVI Inject 0.3 mg into the muscle once.      Marland Kitchen HYDROcodone-acetaminophen (VICODIN) 5-500 MG per tablet Take 1 tablet by mouth every 6 (six) hours as needed for pain.  60 tablet  5  . levocetirizine (XYZAL) 5 MG tablet Take 5 mg by mouth every evening.      Marland Kitchen levonorgestrel (MIRENA) 20 MCG/24HR IUD 1 each by Intrauterine route once.      . methocarbamol (ROBAXIN) 750 MG tablet Take 750 mg by mouth 4 (four)  times daily as needed.        . mometasone (NASONEX) 50 MCG/ACT nasal spray Place 2 sprays into the nose daily.      . montelukast (SINGULAIR) 10 MG tablet Take 10 mg by mouth at bedtime.      . nortriptyline (PAMELOR) 10 MG capsule TAKE 4 CAPSULES BY MOUTH AT BEDTIME  120 capsule  6  . phentermine 37.5 MG capsule Take 1 capsule (37.5 mg total) by mouth every morning.  30 capsule  5  . temazepam (RESTORIL) 30 MG capsule Take 1 capsule (30 mg total) by mouth at bedtime as needed for sleep.  30 capsule  2  . [DISCONTINUED] fexofenadine (ALLEGRA) 180 MG tablet Take 180 mg by mouth daily.        . [DISCONTINUED] Norgestimate-Ethinyl Estradiol Triphasic (TRI-SPRINTEC) 0.18/0.215/0.25 MG-35 MCG tablet Take 1 tablet by mouth daily.  28 tablet  11   No current facility-administered medications for this visit.    Allergies  Allergen Reactions  . Penicillins     REACTION: hives    Family History  Problem Relation Age of Onset  . Arthritis    . Coronary artery disease    . Depression    . Diabetes    . Hyperlipidemia    . Sudden death    . Colon polyps Brother  History  Substance Use Topics  . Smoking status: Never Smoker   . Smokeless tobacco: Never Used  . Alcohol Use: No    ROS: As per history of present illness, otherwise negative  BP 118/80  Pulse 88  Ht 5' (1.524 m)  Wt 202 lb 6 oz (91.797 kg)  BMI 39.52 kg/m2 Constitutional: Well-developed and well-nourished. No distress. HEENT: Normocephalic and atraumatic. Oropharynx is clear and moist. No oropharyngeal exudate. Conjunctivae are normal.  No scleral icterus. Neck: Neck supple. Trachea midline. Cardiovascular: Normal rate, regular rhythm and intact distal pulses. No M/R/G Pulmonary/chest: Effort normal and breath sounds normal. No wheezing, rales or rhonchi. Abdominal: Soft, nontender, nondistended. Bowel sounds active throughout.  Extremities: no clubbing, cyanosis, or edema Lymphadenopathy: No cervical adenopathy  noted. Neurological: Alert and oriented to person place and time. Skin: Skin is warm and dry. No rashes noted. Psychiatric: Normal mood and affect. Behavior is normal.  RELEVANT LABS AND IMAGING: CBC    Component Value Date/Time   WBC 11.6* 04/07/2012 0920   RBC 4.59 04/07/2012 0920   HGB 13.9 04/07/2012 0920   HCT 40.7 04/07/2012 0920   PLT 298.0 04/07/2012 0920   MCV 88.7 04/07/2012 0920   MCH 23.7* 01/11/2010 0852   MCHC 34.0 04/07/2012 0920   RDW 14.1 04/07/2012 0920   LYMPHSABS 2.8 04/07/2012 0920   MONOABS 0.8 04/07/2012 0920   EOSABS 0.3 04/07/2012 0920   BASOSABS 0.1 04/07/2012 0920    CMP     Component Value Date/Time   NA 135 04/07/2012 0920   K 4.0 04/07/2012 0920   CL 103 04/07/2012 0920   CO2 25 04/07/2012 0920   GLUCOSE 92 04/07/2012 0920   BUN 9 04/07/2012 0920   CREATININE 0.7 04/07/2012 0920   CALCIUM 8.7 04/07/2012 0920   PROT 7.6 04/07/2012 0920   ALBUMIN 3.6 04/07/2012 0920   AST 30 04/07/2012 0920   ALT 38* 04/07/2012 0920   ALKPHOS 100 04/07/2012 0920   BILITOT 0.5 04/07/2012 0920   GFRNONAA >60 01/11/2010 0852   GFRAA  Value: >60        The eGFR has been calculated using the MDRD equation. This calculation has not been validated in all clinical situations. eGFR's persistently <60 mL/min signify possible Chronic Kidney Disease. 01/11/2010 2956    ASSESSMENT/PLAN: 42 year old female with a past medical history migraines, osteoarthritis, benign hematuria, and now a family history of adenomatous colon polyps who is seen in consultation at the request of Dr. Clent Ridges for evaluation of elevated rescreening colonoscopy.  1.  Elevated risk CRC screening/family history of early adenomatous colon polyp -- the patient is asymptomatic, but based on her brothers history of adenomatous colon polyp at age 67, I recommended that she proceed to screening colonoscopy now. We discussed the test today including the risks and benefits and she is agreeable to proceed. She is not taking any blood  thinners.  This test will be scheduled for her.  Time provided for questions and answers, but she is satisfied with the plan.

## 2012-09-24 NOTE — Patient Instructions (Addendum)
You have been scheduled for a colonoscopy with propofol. Please follow written instructions given to you at your visit today.  Please pick up your prep kit at the pharmacy within the next 1-3 days. If you use inhalers (even only as needed), please bring them with you on the day of your procedure. Your physician has requested that you go to www.startemmi.com and enter the access code given to you at your visit today. This web site gives a general overview about your procedure. However, you should still follow specific instructions given to you by our office regarding your preparation for the procedure.  We have sent the following medications to your pharmacy for you to pick up at your convenience: Moviprep; you were given instructions today at your office visit.                                               We are excited to introduce MyChart, a new best-in-class service that provides you online access to important information in your electronic medical record. We want to make it easier for you to view your health information - all in one secure location - when and where you need it. We expect MyChart will enhance the quality of care and service we provide.  When you register for MyChart, you can:    View your test results.    Request appointments and receive appointment reminders via email.    Request medication renewals.    View your medical history, allergies, medications and immunizations.    Communicate with your physician's office through a password-protected site.    Conveniently print information such as your medication lists.  To find out if MyChart is right for you, please talk to a member of our clinical staff today. We will gladly answer your questions about this free health and wellness tool.  If you are age 43 or older and want a member of your family to have access to your record, you must provide written consent by completing a proxy form available at our office. Please  speak to our clinical staff about guidelines regarding accounts for patients younger than age 30.  As you activate your MyChart account and need any technical assistance, please call the MyChart technical support line at [phone]) or email your question to [email or web address]. If you email your question(s), please include your name, a return phone number and the best time to reach you.  If you have non-urgent health-related questions, you can send a message to our office through MyChart at [email or web address]. If you have a medical emergency, call 911.  Thank you for using MyChart as your new health and wellness resource!   MyChart licensed from [employer],  [id number]. Patents Pending.

## 2012-10-04 ENCOUNTER — Ambulatory Visit (AMBULATORY_SURGERY_CENTER): Payer: BC Managed Care – PPO | Admitting: Internal Medicine

## 2012-10-04 ENCOUNTER — Encounter: Payer: Self-pay | Admitting: Internal Medicine

## 2012-10-04 VITALS — BP 153/73 | HR 87 | Temp 99.1°F | Resp 25 | Ht 60.0 in | Wt 202.0 lb

## 2012-10-04 DIAGNOSIS — D126 Benign neoplasm of colon, unspecified: Secondary | ICD-10-CM

## 2012-10-04 DIAGNOSIS — Z1211 Encounter for screening for malignant neoplasm of colon: Secondary | ICD-10-CM

## 2012-10-04 DIAGNOSIS — Z8371 Family history of colonic polyps: Secondary | ICD-10-CM

## 2012-10-04 MED ORDER — SODIUM CHLORIDE 0.9 % IV SOLN: 500.0000 mL | INTRAVENOUS | Status: DC

## 2012-10-04 NOTE — Progress Notes (Signed)
Called to room to assist during endoscopic procedure.  Patient ID and intended procedure confirmed with present staff. Received instructions for my participation in the procedure from the performing physician.  

## 2012-10-04 NOTE — Patient Instructions (Addendum)
YOU HAD AN ENDOSCOPIC PROCEDURE TODAY AT THE [facility]: Refer to the procedure report that was given to you for any specific questions about what was found during the examination.  If the procedure report does not answer your questions, please call your gastroenterologist to clarify.  If you requested that your care partner not be given the details of your procedure findings, then the procedure report has been included in a sealed envelope for you to review at your convenience later.  YOU SHOULD EXPECT: Some feelings of bloating in the abdomen. Passage of more gas than usual.  Walking can help get rid of the air that was put into your GI tract during the procedure and reduce the bloating. If you had a lower endoscopy (such as a colonoscopy or flexible sigmoidoscopy) you may notice spotting of blood in your stool or on the toilet paper. If you underwent a bowel prep for your procedure, then you may not have a normal bowel movement for a few days.  DIET: Your first meal following the procedure should be a light meal and then it is ok to progress to your normal diet.  A half-sandwich or bowl of soup is an example of a good first meal.  Heavy or fried foods are harder to digest and may make you feel nauseous or bloated.  Likewise meals heavy in dairy and vegetables can cause extra gas to form and this can also increase the bloating.  Drink plenty of fluids but you should avoid alcoholic beverages for 24 hours.  TRY TO INCREASE THE FIBER IN YOUR DIET.  ACTIVITY: Your care partner should take you home directly after the procedure.  You should plan to take it easy, moving slowly for the rest of the day.  You can resume normal activity the day after the procedure however you should NOT DRIVE or use heavy machinery for 24 hours (because of the sedation medicines used during the test).    SYMPTOMS TO REPORT IMMEDIATELY: A gastroenterologist can be reached at any hour.  During normal business hours,  8:30 AM to 5:00 PM Monday through Friday, call [phone].  After hours and on weekends, please call the GI answering service at [phone] who will take a message and have the physician on call contact you.   Following lower endoscopy (colonoscopy or flexible sigmoidoscopy):  Excessive amounts of blood in the stool  Significant tenderness or worsening of abdominal pains  Swelling of the abdomen that is new, acute  Fever of 100F or higher  FOLLOW UP: If any biopsies were taken you will be contacted by phone or by letter within the next 1-3 weeks.  Call your gastroenterologist if you have not heard about the biopsies in 3 weeks.  Our staff will call the home number listed on your records the next business day following your procedure to check on you and address any questions or concerns that you may have at that time regarding the information given to you following your procedure. This is a courtesy call and so if there is no answer at the home number and we have not heard from you through the emergency physician on call, we will assume that you have returned to your regular daily activities without incident.  SIGNATURES/CONFIDENTIALITY: You and/or your care partner have signed paperwork which will be entered into your electronic medical record.  These signatures attest to the fact that that the information above on your After Visit Summary has been reviewed and is   understood.  Full responsibility of the confidentiality of this discharge information lies with you and/or your care-partner. 

## 2012-10-04 NOTE — Op Note (Signed)
[facility] [address] [place], [address]   COLONOSCOPY PROCEDURE REPORT  PATIENT: Zoe, [name]  MR#: [id number] BIRTHDATE: [birth year] , 41  yrs. old GENDER: Female ENDOSCOPIST: Beverley Fiedler, MD REFERRED [id number] Clent Ridges, M.D. PROCEDURE DATE:  10/04/2012 PROCEDURE:   Colonoscopy with cold biopsy polypectomy First Screening Colonoscopy - Avg.  risk and is 50 yrs.  old or older - No.  Prior Negative Screening - Now for repeat screening. N/A  History of Adenoma - Now for follow-up colonoscopy & has been > or = to 3 yrs.  N/A  Polyps Removed Today? Yes. ASA CLASS:   Class II INDICATIONS:elevated risk screening and Patient's family history of adenomatous colon polyps, brother age 57. MEDICATIONS: MAC sedation, administered by CRNA and Propofol (Diprivan) 360 mg IV  DESCRIPTION OF PROCEDURE:   After the risks benefits and alternatives of the procedure were thoroughly explained, informed consent was obtained.  A digital rectal exam revealed external hemorrhoids.   The LB [id number]  endoscope was introduced through the anus and advanced to the cecum, which was identified by both the appendix and ileocecal valve. No adverse events experienced.   The quality of the prep was good, using MoviPrep The instrument was then slowly withdrawn as the colon was fully examined.   COLON FINDINGS: A sessile polyp measuring 3 mm in size was found in the transverse colon.  A polypectomy was performed with cold forceps.  The resection was complete and the polyp tissue was completely retrieved.   There was mild diverticulosis noted in the descending colon and sigmoid colon with associated muscular hypertrophy.  Retroflexed views revealed no abnormalities. The time to cecum=3 minutes 18 seconds.  Withdrawal time=11 minutes 05 seconds.  The scope was withdrawn and the procedure completed. COMPLICATIONS: There were no complications.  ENDOSCOPIC IMPRESSION: 1.   Sessile  polyp measuring 3 mm in size was found in the transverse colon; polypectomy was performed with cold forceps 2.   There was mild diverticulosis noted in the descending colon and sigmoid colon  RECOMMENDATIONS: 1.  Await pathology results 2.  High fiber diet 3.  Repeat Colonoscopy in 5 years. 4.  You will receive a letter within 1-2 weeks with the results of your biopsy as well as final recommendations.  Please call my office if you have not received a letter after 3 weeks.   eSigned:  Beverley Fiedler, MD 10/04/2012 3:00 PM  cc: Nelwyn Salisbury, MD and The Patient

## 2012-10-04 NOTE — Progress Notes (Signed)
Patient did not have preoperative order for IV antibiotic SSI prophylaxis. (G8918)  Patient did not experience any of the following events: a burn prior to discharge; a fall within the facility; wrong site/side/patient/procedure/implant event; or a hospital transfer or hospital admission upon discharge from the facility. (G8907)  

## 2012-10-05 ENCOUNTER — Telehealth: Payer: Self-pay | Admitting: *Deleted

## 2012-10-05 NOTE — Telephone Encounter (Signed)
  Follow up Call-  Call back number 10/04/2012  Post procedure Call Back phone  # [phone]  Permission to leave phone message Yes     Patient questions:  Do you have a fever, pain , or abdominal swelling? no Pain Score  0 *  Have you tolerated food without any problems? yes  Have you been able to return to your normal activities? yes  Do you have any questions about your discharge instructions: Diet   no Medications  no Follow up visit  no  Do you have questions or concerns about your Care? no  Actions: * If pain score is 4 or above: No action needed, pain <4.

## 2012-10-11 ENCOUNTER — Encounter: Payer: Self-pay | Admitting: Internal Medicine

## 2012-10-14 ENCOUNTER — Encounter: Payer: Self-pay | Admitting: Internal Medicine

## 2012-10-21 ENCOUNTER — Ambulatory Visit: Payer: Self-pay | Admitting: Nurse Practitioner

## 2012-11-12 ENCOUNTER — Other Ambulatory Visit: Payer: Self-pay

## 2012-11-12 ENCOUNTER — Other Ambulatory Visit: Payer: Self-pay | Admitting: Family Medicine

## 2012-11-12 DIAGNOSIS — Z1231 Encounter for screening mammogram for malignant neoplasm of breast: Secondary | ICD-10-CM

## 2012-11-30 ENCOUNTER — Ambulatory Visit
Admission: RE | Admit: 2012-11-30 | Discharge: 2012-11-30 | Disposition: A | Discharge status: Home / Self Care | Payer: BC Managed Care – PPO | Source: Ambulatory Visit

## 2012-11-30 DIAGNOSIS — Z1231 Encounter for screening mammogram for malignant neoplasm of breast: Secondary | ICD-10-CM

## 2012-12-13 ENCOUNTER — Ambulatory Visit: Payer: Self-pay | Admitting: Nurse Practitioner

## 2012-12-15 ENCOUNTER — Telehealth: Payer: Self-pay | Admitting: Family Medicine

## 2012-12-15 NOTE — Telephone Encounter (Signed)
Pt needs refill on methocarbamol 750 mg call into [facility]. For back spasms

## 2012-12-16 MED ORDER — METHOCARBAMOL 750 MG PO TABS: 750.0000 mg | ORAL_TABLET | Freq: Four times a day (QID) | ORAL | Status: DC | PRN

## 2012-12-16 NOTE — Telephone Encounter (Signed)
Per Dr. Clent Ridges, okay to give refills and I did send script e-scribe.

## 2012-12-30 ENCOUNTER — Other Ambulatory Visit: Payer: Self-pay

## 2012-12-31 ENCOUNTER — Encounter: Payer: Self-pay | Admitting: Internal Medicine

## 2012-12-31 ENCOUNTER — Ambulatory Visit (INDEPENDENT_AMBULATORY_CARE_PROVIDER_SITE_OTHER): Payer: BC Managed Care – PPO | Admitting: Internal Medicine

## 2012-12-31 VITALS — BP 130/90 | HR 94 | Temp 98.4°F | Wt 205.0 lb

## 2012-12-31 DIAGNOSIS — J329 Chronic sinusitis, unspecified: Secondary | ICD-10-CM

## 2012-12-31 DIAGNOSIS — J309 Allergic rhinitis, unspecified: Secondary | ICD-10-CM

## 2012-12-31 DIAGNOSIS — J019 Acute sinusitis, unspecified: Secondary | ICD-10-CM

## 2012-12-31 MED ORDER — CEFUROXIME AXETIL 500 MG PO TABS: 500.0000 mg | ORAL_TABLET | Freq: Two times a day (BID) | ORAL | Status: DC

## 2012-12-31 NOTE — Progress Notes (Signed)
Chief Complaint  Patient presents with  . Nasal Congestion    Ongoing on for 1 week.  Tried Sudafed with no relief.  . Headache  . Cough  . Post Nasal Drip    HPI: Patient comes in today for SDA for  new problem evaluation. Feels like  sinu infection.  Now with ha  No fever.   Ha all over.  Onset 1 week agop wioth increase PND ? From allergies and now  Drainage down  Back of throat more than usual.    Sudafed not helping as usual.  Now with ha left face tender   Hx of allergies on shots reglualry per dr Olla Callas No chills  NVD.  ROS: See pertinent positives and negatives per HPI. Using nsaonsex ans saline and her allergy meds  No sig cough    Past Medical History  Diagnosis Date  . Allergy   . Anemia   . Chickenpox   . Osteoarthritis   . Menorrhagia   . Benign hematuria     worked up by Dr. Ihor Gully  . Horseshoe kidney     Family History  Problem Relation Age of Onset  . Arthritis    . Coronary artery disease    . Depression    . Diabetes    . Hyperlipidemia    . Sudden death    . Colon polyps Brother     History   Social History  . Marital Status: Single    Spouse Name: N/A    Number of Children: N/A  . Years of Education: N/A   Social History Main Topics  . Smoking status: Never Smoker   . Smokeless tobacco: Never Used  . Alcohol Use: No  . Drug Use: No  . Sexual Activity: None   Other Topics Concern  . None   Social History Narrative  . None    Outpatient Encounter Prescriptions as of 12/31/2012  Medication Sig  . atorvastatin (LIPITOR) 40 MG tablet TAKE 1 TABLET BY MOUTH DAILY  . EPINEPHrine (EPI-PEN) 0.3 mg/0.3 mL DEVI Inject 0.3 mg into the muscle once.  Marland Kitchen HYDROcodone-acetaminophen (VICODIN) 5-500 MG per tablet Take 1 tablet by mouth every 6 (six) hours as needed for pain.  Marland Kitchen levocetirizine (XYZAL) 5 MG tablet Take 5 mg by mouth every evening.  Marland Kitchen levonorgestrel (MIRENA) 20 MCG/24HR IUD 1 each by Intrauterine route once.  . methocarbamol  (ROBAXIN) 750 MG tablet Take 1 tablet (750 mg total) by mouth 4 (four) times daily as needed.  . mometasone (NASONEX) 50 MCG/ACT nasal spray Place 2 sprays into the nose daily.  . montelukast (SINGULAIR) 10 MG tablet Take 10 mg by mouth at bedtime.  . nortriptyline (PAMELOR) 10 MG capsule TAKE 4 CAPSULES BY MOUTH AT BEDTIME  . temazepam (RESTORIL) 30 MG capsule Take 1 capsule (30 mg total) by mouth at bedtime as needed for sleep.  . cefUROXime (CEFTIN) 500 MG tablet Take 1 tablet (500 mg total) by mouth 2 (two) times daily. For sinusitis  . [DISCONTINUED] phentermine 37.5 MG capsule Take 1 capsule (37.5 mg total) by mouth every morning.    EXAM:  BP 130/90  Pulse 94  Temp(Src) 98.4 F (36.9 C) (Oral)  Wt 205 lb (92.987 kg)  SpO2 98%  Body mass index is 40.04 kg/(m^2). WDWN in NAD  quiet respirations; mildly congested  somewhat hoarse. Non toxic . HEENT: Normocephalic ;atraumatic , Eyes;  PERRL, EOMs  Full, lids and conjunctiva clear,,Ears: no deformities, canals nl, TM landmarks  normal, Nose: no deformity or discharge but congested;face mild right max  tender Mouth : OP clear without lesion or edema . Drainage tracts seen  Neck: Supple without adenopathy or masses or bruits Chest:  Clear to A&P without wheezes rales or rhonchi CV:  S1-S2 no gallops or murmurs peripheral perfusion is normal Skin :nl perfusion and no acute rashes   GENER PSYCH: pleasant and cooperative, no obvious depression or anxiety  ASSESSMENT AND PLAN:  Discussed the following assessment and plan:  Acute sinus infection  Sinusitis, chronic  ALLERGIC RHINITIS Acute on chronic   overa  Week and progressing no response to sudafed etc  Can take cephalo  Not pcn -Patient advised to return or notify health care team  if symptoms worsen or persist or new concerns arise.  Patient Instructions  Continue allergy meds saline nose spray etc.  Add antibiotic   Fu if needed   Sinusitis Sinusitis is redness,  soreness, and swelling (inflammation) of the paranasal sinuses. Paranasal sinuses are air pockets within the bones of your face (beneath the eyes, the middle of the forehead, or above the eyes). In healthy paranasal sinuses, mucus is able to drain out, and air is able to circulate through them by way of your nose. However, when your paranasal sinuses are inflamed, mucus and air can become trapped. This can allow bacteria and other germs to grow and cause infection. Sinusitis can develop quickly and last only a short time (acute) or continue over a long period (chronic). Sinusitis that lasts for more than 12 weeks is considered chronic.  CAUSES  Causes of sinusitis include:  Allergies.  Structural abnormalities, such as displacement of the cartilage that separates your nostrils (deviated septum), which can decrease the air flow through your nose and sinuses and affect sinus drainage.  Functional abnormalities, such as when the small hairs (cilia) that line your sinuses and help remove mucus do not work properly or are not present. SYMPTOMS  Symptoms of acute and chronic sinusitis are the same. The primary symptoms are pain and pressure around the affected sinuses. Other symptoms include:  Upper toothache.  Earache.  Headache.  Bad breath.  Decreased sense of smell and taste.  A cough, which worsens when you are lying flat.  Fatigue.  Fever.  Thick drainage from your nose, which often is green and may contain pus (purulent).  Swelling and warmth over the affected sinuses. DIAGNOSIS  Your caregiver will perform a physical exam. During the exam, your caregiver may:  Look in your nose for signs of abnormal growths in your nostrils (nasal polyps).  Tap over the affected sinus to check for signs of infection.  View the inside of your sinuses (endoscopy) with a special imaging device with a light attached (endoscope), which is inserted into your sinuses. If your caregiver suspects  that you have chronic sinusitis, one or more of the following tests may be recommended:  Allergy tests.  Nasal culture A sample of mucus is taken from your nose and sent to a lab and screened for bacteria.  Nasal cytology A sample of mucus is taken from your nose and examined by your caregiver to determine if your sinusitis is related to an allergy. TREATMENT  Most cases of acute sinusitis are related to a viral infection and will resolve on their own within 10 days. Sometimes medicines are prescribed to help relieve symptoms (pain medicine, decongestants, nasal steroid sprays, or saline sprays).  However, for sinusitis related to a bacterial infection, your  caregiver will prescribe antibiotic medicines. These are medicines that will help kill the bacteria causing the infection.  Rarely, sinusitis is caused by a fungal infection. In theses cases, your caregiver will prescribe antifungal medicine. For some cases of chronic sinusitis, surgery is needed. Generally, these are cases in which sinusitis recurs more than 3 times per year, despite other treatments. HOME CARE INSTRUCTIONS   Drink plenty of water. Water helps thin the mucus so your sinuses can drain more easily.  Use a humidifier.  Inhale steam 3 to 4 times a day (for example, sit in the bathroom with the shower running).  Apply a warm, moist washcloth to your face 3 to 4 times a day, or as directed by your caregiver.  Use saline nasal sprays to help moisten and clean your sinuses.  Take over-the-counter or prescription medicines for pain, discomfort, or fever only as directed by your caregiver. SEEK IMMEDIATE MEDICAL CARE IF:  You have increasing pain or severe headaches.  You have nausea, vomiting, or drowsiness.  You have swelling around your face.  You have vision problems.  You have a stiff neck.  You have difficulty breathing. MAKE SURE YOU:   Understand these instructions.  Will watch your condition.  Will get  help right away if you are not doing well or get worse. Document Released: 02/10/2005 Document Revised: 05/05/2011 Document Reviewed: 02/25/2011 [facility] Patient Information 2014 [place], [place].      Neta Mends. Panosh M.D.

## 2012-12-31 NOTE — Patient Instructions (Signed)
Continue allergy meds saline nose spray etc.  Add antibiotic   Fu if needed   Sinusitis Sinusitis is redness, soreness, and swelling (inflammation) of the paranasal sinuses. Paranasal sinuses are air pockets within the bones of your face (beneath the eyes, the middle of the forehead, or above the eyes). In healthy paranasal sinuses, mucus is able to drain out, and air is able to circulate through them by way of your nose. However, when your paranasal sinuses are inflamed, mucus and air can become trapped. This can allow bacteria and other germs to grow and cause infection. Sinusitis can develop quickly and last only a short time (acute) or continue over a long period (chronic). Sinusitis that lasts for more than 12 weeks is considered chronic.  CAUSES  Causes of sinusitis include:  Allergies.  Structural abnormalities, such as displacement of the cartilage that separates your nostrils (deviated septum), which can decrease the air flow through your nose and sinuses and affect sinus drainage.  Functional abnormalities, such as when the small hairs (cilia) that line your sinuses and help remove mucus do not work properly or are not present. SYMPTOMS  Symptoms of acute and chronic sinusitis are the same. The primary symptoms are pain and pressure around the affected sinuses. Other symptoms include:  Upper toothache.  Earache.  Headache.  Bad breath.  Decreased sense of smell and taste.  A cough, which worsens when you are lying flat.  Fatigue.  Fever.  Thick drainage from your nose, which often is green and may contain pus (purulent).  Swelling and warmth over the affected sinuses. DIAGNOSIS  Your caregiver will perform a physical exam. During the exam, your caregiver may:  Look in your nose for signs of abnormal growths in your nostrils (nasal polyps).  Tap over the affected sinus to check for signs of infection.  View the inside of your sinuses (endoscopy) with a special  imaging device with a light attached (endoscope), which is inserted into your sinuses. If your caregiver suspects that you have chronic sinusitis, one or more of the following tests may be recommended:  Allergy tests.  Nasal culture A sample of mucus is taken from your nose and sent to a lab and screened for bacteria.  Nasal cytology A sample of mucus is taken from your nose and examined by your caregiver to determine if your sinusitis is related to an allergy. TREATMENT  Most cases of acute sinusitis are related to a viral infection and will resolve on their own within 10 days. Sometimes medicines are prescribed to help relieve symptoms (pain medicine, decongestants, nasal steroid sprays, or saline sprays).  However, for sinusitis related to a bacterial infection, your caregiver will prescribe antibiotic medicines. These are medicines that will help kill the bacteria causing the infection.  Rarely, sinusitis is caused by a fungal infection. In theses cases, your caregiver will prescribe antifungal medicine. For some cases of chronic sinusitis, surgery is needed. Generally, these are cases in which sinusitis recurs more than 3 times per year, despite other treatments. HOME CARE INSTRUCTIONS   Drink plenty of water. Water helps thin the mucus so your sinuses can drain more easily.  Use a humidifier.  Inhale steam 3 to 4 times a day (for example, sit in the bathroom with the shower running).  Apply a warm, moist washcloth to your face 3 to 4 times a day, or as directed by your caregiver.  Use saline nasal sprays to help moisten and clean your sinuses.  Take over-the-counter  or prescription medicines for pain, discomfort, or fever only as directed by your caregiver. SEEK IMMEDIATE MEDICAL CARE IF:  You have increasing pain or severe headaches.  You have nausea, vomiting, or drowsiness.  You have swelling around your face.  You have vision problems.  You have a stiff neck.  You have  difficulty breathing. MAKE SURE YOU:   Understand these instructions.  Will watch your condition.  Will get help right away if you are not doing well or get worse. Document Released: 02/10/2005 Document Revised: 05/05/2011 Document Reviewed: 02/25/2011 [facility] Patient Information 2014 [place], [place].

## 2013-02-25 ENCOUNTER — Telehealth: Payer: Self-pay | Admitting: Family Medicine

## 2013-02-25 MED ORDER — OSELTAMIVIR PHOSPHATE 75 MG PO CAPS: 75.0000 mg | ORAL_CAPSULE | Freq: Two times a day (BID) | ORAL | Status: DC

## 2013-02-25 NOTE — Telephone Encounter (Signed)
Left message on personally identified voicemail to advise pt of note 

## 2013-02-25 NOTE — Telephone Encounter (Signed)
Pt's granddaughter has been DX w/ the flu.pt was wondering if you do a prevenative tamilfu for pts"? Pharm: [facility] Pt's parents are pt's of Dr swords  W/ multiple lung issues. Also wondering if he would do for them. pls advise.

## 2013-02-25 NOTE — Telephone Encounter (Signed)
Please advise 

## 2013-02-25 NOTE — Telephone Encounter (Signed)
I cannot treat her parents, but we can call in Tamiflu for Zoe [name]. Call in 75 mg bid for 5 days

## 2013-03-15 ENCOUNTER — Other Ambulatory Visit: Payer: Self-pay | Admitting: Family Medicine

## 2013-03-17 ENCOUNTER — Ambulatory Visit (INDEPENDENT_AMBULATORY_CARE_PROVIDER_SITE_OTHER): Payer: BC Managed Care – PPO | Admitting: Family Medicine

## 2013-03-17 ENCOUNTER — Encounter: Payer: Self-pay | Admitting: Family Medicine

## 2013-03-17 VITALS — BP 124/80 | HR 116 | Temp 99.0°F | Ht 60.0 in | Wt 216.0 lb

## 2013-03-17 DIAGNOSIS — J019 Acute sinusitis, unspecified: Secondary | ICD-10-CM

## 2013-03-17 MED ORDER — CEFUROXIME AXETIL 500 MG PO TABS: 500.0000 mg | ORAL_TABLET | Freq: Two times a day (BID) | ORAL | Status: DC

## 2013-03-17 MED ORDER — TEMAZEPAM 30 MG PO CAPS: 30.0000 mg | ORAL_CAPSULE | Freq: Every evening | ORAL | Status: DC | PRN

## 2013-03-17 MED ORDER — HYDROCODONE-HOMATROPINE 5-1.5 MG/5ML PO SYRP: 5.0000 mL | ORAL_SOLUTION | ORAL | Status: DC | PRN

## 2013-03-17 MED ORDER — ALPRAZOLAM 1 MG PO TABS: 1.0000 mg | ORAL_TABLET | Freq: Two times a day (BID) | ORAL | Status: DC | PRN

## 2013-03-17 NOTE — Progress Notes (Signed)
   Subjective:    Patient ID: Zoe [name], female    DOB: Jan 27, 1971, 43 y.o.   MRN: [id number]  HPI Here for one week of sinus pressure, PND, a dry cough, and hoarseness. No fever.    Review of Systems  Constitutional: Negative.   HENT: Positive for congestion, postnasal drip and sinus pressure.   Eyes: Negative.   Respiratory: Positive for cough.        Objective:   Physical Exam  Constitutional: She appears well-developed and well-nourished.  HENT:  Right Ear: External ear normal.  Left Ear: External ear normal.  Nose: Nose normal.  Mouth/Throat: Oropharynx is clear and moist.  Eyes: Conjunctivae are normal.  Pulmonary/Chest: Effort normal and breath sounds normal.  Lymphadenopathy:    She has no cervical adenopathy.          Assessment & Plan:  Add Mucinex.

## 2013-03-17 NOTE — Progress Notes (Signed)
Pre visit review using our clinic review tool, if applicable. No additional management support is needed unless otherwise documented below in the visit note. 

## 2013-04-03 ENCOUNTER — Other Ambulatory Visit: Payer: Self-pay | Admitting: Nurse Practitioner

## 2013-04-06 ENCOUNTER — Encounter: Payer: Self-pay | Admitting: Family Medicine

## 2013-04-06 ENCOUNTER — Other Ambulatory Visit (INDEPENDENT_AMBULATORY_CARE_PROVIDER_SITE_OTHER): Payer: BC Managed Care – PPO

## 2013-04-06 DIAGNOSIS — Z0000 Encounter for general adult medical examination without abnormal findings: Secondary | ICD-10-CM

## 2013-04-06 LAB — POCT URINALYSIS DIPSTICK
Bilirubin, UA: NEGATIVE
Glucose, UA: NEGATIVE
Ketones, UA: NEGATIVE
Leukocytes, UA: NEGATIVE
Nitrite, UA: NEGATIVE
Spec Grav, UA: 1.025
Urobilinogen, UA: 0.2
pH, UA: 5.5

## 2013-04-06 LAB — CBC WITH DIFFERENTIAL/PLATELET
Basophils Absolute: 0.1 10*3/uL (ref 0.0–0.1)
Basophils Relative: 0.5 % (ref 0.0–3.0)
Eosinophils Absolute: 0.3 10*3/uL (ref 0.0–0.7)
Eosinophils Relative: 3.1 % (ref 0.0–5.0)
HCT: 41.7 % (ref 36.0–46.0)
Hemoglobin: 13.9 g/dL (ref 12.0–15.0)
Lymphocytes Relative: 25.8 % (ref 12.0–46.0)
Lymphs Abs: 2.9 10*3/uL (ref 0.7–4.0)
MCHC: 33.3 g/dL (ref 30.0–36.0)
MCV: 92.9 fl (ref 78.0–100.0)
Monocytes Absolute: 0.7 10*3/uL (ref 0.1–1.0)
Monocytes Relative: 6.6 % (ref 3.0–12.0)
Neutro Abs: 7.2 10*3/uL (ref 1.4–7.7)
Neutrophils Relative %: 64 % (ref 43.0–77.0)
Platelets: 306 10*3/uL (ref 150.0–400.0)
RBC: 4.49 Mil/uL (ref 3.87–5.11)
RDW: 14 % (ref 11.5–14.6)
WBC: 11.3 10*3/uL — ABNORMAL HIGH (ref 4.5–10.5)

## 2013-04-06 LAB — HEPATIC FUNCTION PANEL
ALT: 46 U/L — ABNORMAL HIGH (ref 0–35)
AST: 34 U/L (ref 0–37)
Albumin: 3.6 g/dL (ref 3.5–5.2)
Alkaline Phosphatase: 90 U/L (ref 39–117)
Bilirubin, Direct: 0.1 mg/dL (ref 0.0–0.3)
Total Bilirubin: 0.7 mg/dL (ref 0.3–1.2)
Total Protein: 7.4 g/dL (ref 6.0–8.3)

## 2013-04-06 LAB — LIPID PANEL
Cholesterol: 138 mg/dL (ref 0–200)
HDL: 40.1 mg/dL (ref >39.00)
LDL Cholesterol: 76 mg/dL (ref 0–99)
Total CHOL/HDL Ratio: 3
Triglycerides: 110 mg/dL (ref 0.0–149.0)
VLDL: 22 mg/dL (ref 0.0–40.0)

## 2013-04-06 LAB — BASIC METABOLIC PANEL
BUN: 11 mg/dL (ref 6–23)
CO2: 25 mEq/L (ref 19–32)
Calcium: 8.8 mg/dL (ref 8.4–10.5)
Chloride: 107 mEq/L (ref 96–112)
Creatinine, Ser: 0.6 mg/dL (ref 0.4–1.2)
GFR: 115.71 mL/min (ref >60.00)
Glucose, Bld: 97 mg/dL (ref 70–99)
Potassium: 4.5 mEq/L (ref 3.5–5.1)
Sodium: 141 mEq/L (ref 135–145)

## 2013-04-06 LAB — TSH: TSH: 4.03 u[IU]/mL (ref 0.35–5.50)

## 2013-04-09 ENCOUNTER — Encounter (HOSPITAL_COMMUNITY): Payer: Self-pay | Admitting: Emergency Medicine

## 2013-04-09 ENCOUNTER — Emergency Department (HOSPITAL_COMMUNITY): Payer: BC Managed Care – PPO

## 2013-04-09 ENCOUNTER — Emergency Department (HOSPITAL_COMMUNITY)
Admission: EM | Admit: 2013-04-09 | Discharge: 2013-04-09 | Disposition: A | Discharge status: Home / Self Care | Payer: BC Managed Care – PPO | Attending: Emergency Medicine | Admitting: Emergency Medicine

## 2013-04-09 DIAGNOSIS — R000 Tachycardia, unspecified: Secondary | ICD-10-CM | POA: Insufficient documentation

## 2013-04-09 DIAGNOSIS — M1990 Unspecified osteoarthritis, unspecified site: Secondary | ICD-10-CM | POA: Insufficient documentation

## 2013-04-09 DIAGNOSIS — T839XXA Unspecified complication of genitourinary prosthetic device, implant and graft, initial encounter: Secondary | ICD-10-CM

## 2013-04-09 DIAGNOSIS — N939 Abnormal uterine and vaginal bleeding, unspecified: Secondary | ICD-10-CM

## 2013-04-09 DIAGNOSIS — N898 Other specified noninflammatory disorders of vagina: Secondary | ICD-10-CM | POA: Insufficient documentation

## 2013-04-09 DIAGNOSIS — Z87448 Personal history of other diseases of urinary system: Secondary | ICD-10-CM | POA: Insufficient documentation

## 2013-04-09 DIAGNOSIS — Z3202 Encounter for pregnancy test, result negative: Secondary | ICD-10-CM | POA: Insufficient documentation

## 2013-04-09 DIAGNOSIS — Y849 Medical procedure, unspecified as the cause of abnormal reaction of the patient, or of later complication, without mention of misadventure at the time of the procedure: Secondary | ICD-10-CM | POA: Insufficient documentation

## 2013-04-09 DIAGNOSIS — Z880 Allergy status to penicillin: Secondary | ICD-10-CM | POA: Insufficient documentation

## 2013-04-09 DIAGNOSIS — T8339XA Other mechanical complication of intrauterine contraceptive device, initial encounter: Secondary | ICD-10-CM | POA: Insufficient documentation

## 2013-04-09 DIAGNOSIS — Z8619 Personal history of other infectious and parasitic diseases: Secondary | ICD-10-CM | POA: Insufficient documentation

## 2013-04-09 DIAGNOSIS — Z79899 Other long term (current) drug therapy: Secondary | ICD-10-CM | POA: Insufficient documentation

## 2013-04-09 DIAGNOSIS — Z862 Personal history of diseases of the blood and blood-forming organs and certain disorders involving the immune mechanism: Secondary | ICD-10-CM | POA: Insufficient documentation

## 2013-04-09 DIAGNOSIS — Z9851 Tubal ligation status: Secondary | ICD-10-CM | POA: Insufficient documentation

## 2013-04-09 LAB — POCT I-STAT, CHEM 8
BUN: 4 mg/dL — ABNORMAL LOW (ref 6–23)
Calcium, Ion: 1.14 mmol/L (ref 1.12–1.23)
Chloride: 103 mEq/L (ref 96–112)
Creatinine, Ser: 0.7 mg/dL (ref 0.50–1.10)
Glucose, Bld: 99 mg/dL (ref 70–99)
HCT: 45 % (ref 36.0–46.0)
Hemoglobin: 15.3 g/dL — ABNORMAL HIGH (ref 12.0–15.0)
Potassium: 3.9 mEq/L (ref 3.7–5.3)
Sodium: 140 mEq/L (ref 137–147)
TCO2: 21 mmol/L (ref 0–100)

## 2013-04-09 LAB — CBC
HCT: 42.4 % (ref 36.0–46.0)
Hemoglobin: 14.8 g/dL (ref 12.0–15.0)
MCH: 31 pg (ref 26.0–34.0)
MCHC: 34.9 g/dL (ref 30.0–36.0)
MCV: 88.9 fL (ref 78.0–100.0)
Platelets: 311 10*3/uL (ref 150–400)
RBC: 4.77 MIL/uL (ref 3.87–5.11)
RDW: 13.3 % (ref 11.5–15.5)
WBC: 15 10*3/uL — ABNORMAL HIGH (ref 4.0–10.5)

## 2013-04-09 LAB — POCT PREGNANCY, URINE: Preg Test, Ur: NEGATIVE

## 2013-04-09 MED ORDER — HYDROCODONE-ACETAMINOPHEN 5-325 MG PO TABS
1.0000 | ORAL_TABLET | ORAL | Status: DC | PRN
Start: 2013-04-09 — End: 2015-04-03

## 2013-04-09 NOTE — Discharge Instructions (Signed)
Take Vicodin for severe pain only. No driving or operating heavy machinery while taking vicodin. This medication may cause drowsiness. Your IUD was removed today as it was causing you bleeding. Follow up with your gynecologist.  Dysfunctional Uterine Bleeding Normally, menstrual periods begin between ages 81 to 62 in young women. A normal menstrual cycle/period may begin every 23 days up to 35 days and lasts from 1 to 7 days. Around 12 to 14 days before your menstrual period starts, ovulation (ovary produces an egg) occurs. When counting the time between menstrual periods, count from the first day of bleeding of the previous period to the first day of bleeding of the next period. Dysfunctional (abnormal) uterine bleeding is bleeding that is different from a normal menstrual period. Your periods may come earlier or later than usual. They may be lighter, have blood clots or be heavier. You may have bleeding between periods, or you may skip one period or more. You may have bleeding after sexual intercourse, bleeding after menopause, or no menstrual period. CAUSES   Pregnancy (normal, miscarriage, tubal).  IUDs (intrauterine device, birth control).  Birth control pills.  Hormone treatment.  Menopause.  Infection of the cervix.  Blood clotting problems.  Infection of the inside lining of the uterus.  Endometriosis, inside lining of the uterus growing in the pelvis and other female organs.  Adhesions (scar tissue) inside the uterus.  Obesity or severe weight loss.  Uterine polyps inside the uterus.  Cancer of the vagina, cervix, or uterus.  Ovarian cysts or polycystic ovary syndrome.  Medical problems (diabetes, thyroid disease).  Uterine fibroids (noncancerous tumor).  Problems with your female hormones.  Endometrial hyperplasia, very thick lining and enlarged cells inside of the uterus.  Medicines that interfere with ovulation.  Radiation to the pelvis or  abdomen.  Chemotherapy. DIAGNOSIS   Your doctor will discuss the history of your menstrual periods, medicines you are taking, changes in your weight, stress in your life, and any medical problems you may have.  Your doctor will do a physical and pelvic examination.  Your doctor may want to perform certain tests to make a diagnosis, such as:  Pap test.  Blood tests.  Cultures for infection.  CT scan.  Ultrasound.  Hysteroscopy.  Laparoscopy.  MRI.  Hysterosalpingography.  D and C.  Endometrial biopsy. TREATMENT  Treatment will depend on the cause of the dysfunctional uterine bleeding (DUB). Treatment may include:  Observing your menstrual periods for a couple of months.  Prescribing medicines for medical problems, including:  Antibiotics.  Hormones.  Birth control pills.  Removing an IUD (intrauterine device, birth control).  Surgery:  D and C (scrape and remove tissue from inside the uterus).  Laparoscopy (examine inside the abdomen with a lighted tube).  Uterine ablation (destroy lining of the uterus with electrical current, laser, heat, or freezing).  Hysteroscopy (examine cervix and uterus with a lighted tube).  Hysterectomy (remove the uterus). HOME CARE INSTRUCTIONS   If medicines were prescribed, take exactly as directed. Do not change or switch medicines without consulting your caregiver.  Long term heavy bleeding may result in iron deficiency. Your caregiver may have prescribed iron pills. They help replace the iron that your body lost from heavy bleeding. Take exactly as directed.  Do not take aspirin or medicines that contain aspirin one week before or during your menstrual period. Aspirin may make the bleeding worse.  If you need to change your sanitary pad or tampon more than once every 2 hours,  stay in bed with your feet elevated and a cold pack on your lower abdomen. Rest as much as possible, until the bleeding stops or slows  down.  Eat well-balanced meals. Eat foods high in iron. Examples are:  Leafy green vegetables.  Whole-grain breads and cereals.  Eggs.  Meat.  Liver.  Do not try to lose weight until the abnormal bleeding has stopped and your blood iron level is back to normal. Do not lift more than ten pounds or do strenuous activities when you are bleeding.  For a couple of months, make note on your calendar, marking the start and ending of your period, and the type of bleeding (light, medium, heavy, spotting, clots or missed periods). This is for your caregiver to better evaluate your problem. SEEK MEDICAL CARE IF:   You develop nausea (feeling sick to your stomach) and vomiting, dizziness, or diarrhea while you are taking your medicine.  You are getting lightheaded or weak.  You have any problems that may be related to the medicine you are taking.  You develop pain with your DUB.  You want to remove your IUD.  You want to stop or change your birth control pills or hormones.  You have any type of abnormal bleeding mentioned above.  You are over 44 years old and have not had a menstrual period yet.  You are 43 years old and you are still having menstrual periods.  You have any of the symptoms mentioned above.  You develop a rash. SEEK IMMEDIATE MEDICAL CARE IF:   An oral temperature above 102 F (38.9 C) develops.  You develop chills.  You are changing your sanitary pad or tampon more than once an hour.  You develop abdominal pain.  You pass out or faint. Document Released: 02/08/2000 Document Revised: 05/05/2011 Document Reviewed: 01/09/2009 [facility] Patient Information 2014 [place].

## 2013-04-09 NOTE — ED Provider Notes (Signed)
CSN: [id number]     Arrival date & time 04/09/13  1711 History   First MD Initiated Contact with Patient 04/09/13 1720     Chief Complaint  Patient presents with  . Abdominal Cramping  . Vaginal Bleeding     (Consider location/radiation/quality/duration/timing/severity/associated sxs/prior Treatment) HPI Comments: Patient is a 43 year old female who presents to the emergency department complaining of vaginal bleeding x 1 week and pelvic cramping x 1 day. Patient states she had an IUD placed 2 years ago, did not have any vaginal bleeding or menstrual periods since, 5 days ago she began to have vaginal spotting increasing in frequency yesterday and today. Patient states she is changing her pad every 2 hours, today she developed severe pelvic cramping. Pain constant, worse at random. No aggravating or alleviating factors. Denies fever, nausea, vomiting, increased urinary frequency, urgency or dysuria, vaginal discharge, weakness. She is not sexually active. States prior to getting an IUD, her periods were very heavy causing her to be anemic.  Patient is a 43 y.o. female presenting with cramps and vaginal bleeding. The history is provided by the patient.  Abdominal Cramping  Vaginal Bleeding   Past Medical History  Diagnosis Date  . Allergy   . Anemia   . Chickenpox   . Osteoarthritis   . Menorrhagia   . Benign hematuria     worked up by Dr. Kathie Rhodes  . Horseshoe kidney    Past Surgical History  Procedure Laterality Date  . Cesarean section    . Tubal ligation    . Tympanostomy     Family History  Problem Relation Age of Onset  . Arthritis    . Coronary artery disease    . Depression    . Diabetes    . Hyperlipidemia    . Sudden death    . Colon polyps Brother    History  Substance Use Topics  . Smoking status: Never Smoker   . Smokeless tobacco: Never Used  . Alcohol Use: No   OB History   Grav Para Term Preterm Abortions TAB SAB Ect Mult Living                  Review of Systems  Genitourinary: Positive for vaginal bleeding and pelvic pain.  All other systems reviewed and are negative.      Allergies  Penicillins  Home Medications   Current Outpatient Rx  Name  Route  Sig  Dispense  Refill  . ALPRAZolam (XANAX) 1 MG tablet   Oral   Take 1 tablet (1 mg total) by mouth 2 (two) times daily as needed for anxiety.   60 tablet   2   . atorvastatin (LIPITOR) 40 MG tablet      TAKE 1 TABLET BY MOUTH DAILY   30 tablet   11   . cefUROXime (CEFTIN) 500 MG tablet   Oral   Take 1 tablet (500 mg total) by mouth 2 (two) times daily. For sinusitis   20 tablet   0   . EPINEPHrine (EPI-PEN) 0.3 mg/0.3 mL DEVI   Intramuscular   Inject 0.3 mg into the muscle once.         Marland Kitchen HYDROcodone-acetaminophen (VICODIN) 5-500 MG per tablet   Oral   Take 1 tablet by mouth every 6 (six) hours as needed for pain.   60 tablet   5   . HYDROcodone-homatropine (HYDROMET) 5-1.5 MG/5ML syrup   Oral   Take 5 mLs by mouth every 4 (four)  hours as needed for cough.   240 mL   0   . levocetirizine (XYZAL) 5 MG tablet   Oral   Take 5 mg by mouth every evening.         Marland Kitchen levonorgestrel (MIRENA) 20 MCG/24HR IUD   Intrauterine   1 each by Intrauterine route once.         . methocarbamol (ROBAXIN) 750 MG tablet   Oral   Take 1 tablet (750 mg total) by mouth 4 (four) times daily as needed.   120 tablet   0   . mometasone (NASONEX) 50 MCG/ACT nasal spray   Nasal   Place 2 sprays into the nose daily.         . montelukast (SINGULAIR) 10 MG tablet   Oral   Take 10 mg by mouth at bedtime.         . nortriptyline (PAMELOR) 10 MG capsule      TAKE 4 CAPSULES BY MOUTH AT BEDTIME   120 capsule   6     We already sent your pharmacy 7 refills on 03/019/ ...   . oseltamivir (TAMIFLU) 75 MG capsule   Oral   Take 1 capsule (75 mg total) by mouth 2 (two) times daily.   10 capsule   0   . rizatriptan (MAXALT-MLT) 10 MG disintegrating  tablet      TAKE 1 TABLET BY MOUTH AS NEEDED FOR ACUTE MIGRAINE   9 tablet   0     PATIENT NEEDS TO BE SEEN   . temazepam (RESTORIL) 30 MG capsule   Oral   Take 1 capsule (30 mg total) by mouth at bedtime as needed for sleep.   30 capsule   5    BP 153/102  Pulse 112  Temp(Src) 98.3 F (36.8 C) (Oral)  Resp 16  SpO2 97%  LMP 04/04/2013 Physical Exam  Nursing note and vitals reviewed. Constitutional: She is oriented to person, place, and time. She appears well-developed and well-nourished. No distress.  HENT:  Head: Normocephalic and atraumatic.  Mouth/Throat: Oropharynx is clear and moist.  Eyes: Conjunctivae are normal.  Neck: Normal range of motion. Neck supple.  Cardiovascular: Regular rhythm and normal heart sounds.  Tachycardia present.   Pulmonary/Chest: Effort normal and breath sounds normal.  Abdominal: Soft. Bowel sounds are normal. There is tenderness in the suprapubic area. There is no rigidity, no rebound and no guarding.  No peritoneal signs.  Genitourinary: Uterus is tender. Cervix exhibits no motion tenderness, no discharge and no friability. Right adnexum displays no mass, no tenderness and no fullness. Left adnexum displays no mass, no tenderness and no fullness. There is bleeding (many large clots) around the vagina. No tenderness around the vagina.  IUD strings visible, possible part of IUD out of cervix.  Musculoskeletal: Normal range of motion. She exhibits no edema.  Neurological: She is alert and oriented to person, place, and time.  Skin: Skin is warm and dry. She is not diaphoretic.  Psychiatric: She has a normal mood and affect. Her behavior is normal.    ED Course  Pelvic exam Date/Time: 04/09/2013 9:34 PM Performed by: Illene Labrador Authorized by: Illene Labrador Consent: Verbal consent obtained. Risks and benefits: risks, benefits and alternatives were discussed Local anesthesia used: no Patient sedated: no Comments: IUD removed, large  blood clot surrounding.   (including critical care time) Labs Review Labs Reviewed  CBC - Abnormal; Notable for the following:    WBC 15.0 (*)  All other components within normal limits  POCT I-STAT, CHEM 8 - Abnormal; Notable for the following:    BUN 4 (*)    Hemoglobin 15.3 (*)    All other components within normal limits  POCT PREGNANCY, URINE   Imaging Review US Transvaginal Non-ob  04/09/2013   CLINICAL DATA:  43 year old female with IUD in place for several years. After 2 years of the cycle on set of heavy bleeding, increasing with severe cramping. Initial encounter.  EXAM: TRANSABDOMINAL AND TRANSVAGINAL ULTRASOUND OF PELVIS  TECHNIQUE: Both transabdominal and transvaginal ultrasound examinations of the pelvis were performed. Transabdominal technique was performed for global imaging of the pelvis including uterus, ovaries, adnexal regions, and pelvic cul-de-sac. It was necessary to proceed with endovaginal exam following the transabdominal exam to visualize the endometrium, ovaries.  COMPARISON:  Abdomen ultrasound 10/04/2009.  FINDINGS: Uterus  Measurements: 11.6 x 5.3 x 6.7. No fibroids or other mass visualized.  Endometrium  Thickness: Up to 3 cm, with inhomogeneous complex contents (image 37. The IUD is identified in the lower uterine segment / endocervical canal (image 25, 42).  Right ovary  Measurements: 4.3 x 1.8 x 1.9 cm. Normal appearance/no adnexal mass.  Left ovary  Measurements: 3.1 x 2.1 x 2.1 cm. Dominant follicle measuring 18 mm, simple. Normal appearance/no adnexal mass.  Other findings  No free fluid.  IMPRESSION: 1. IUD appears to be migrating out of the uterus, projects at the level of the cervix. 2. Proximal to the IUD there is bulky, complex avascular material/debris within the endometrial canal measuring up to 3 cm in thickness. 3. No free fluid.  Normal ovaries.   Electronically Signed   By: Lars Pinks M.D.   On: 04/09/2013 21:12   US Pelvis Complete  04/09/2013    CLINICAL DATA:  43 year old female with IUD in place for several years. After 2 years of the cycle on set of heavy bleeding, increasing with severe cramping. Initial encounter.  EXAM: TRANSABDOMINAL AND TRANSVAGINAL ULTRASOUND OF PELVIS  TECHNIQUE: Both transabdominal and transvaginal ultrasound examinations of the pelvis were performed. Transabdominal technique was performed for global imaging of the pelvis including uterus, ovaries, adnexal regions, and pelvic cul-de-sac. It was necessary to proceed with endovaginal exam following the transabdominal exam to visualize the endometrium, ovaries.  COMPARISON:  Abdomen ultrasound 10/04/2009.  FINDINGS: Uterus  Measurements: 11.6 x 5.3 x 6.7. No fibroids or other mass visualized.  Endometrium  Thickness: Up to 3 cm, with inhomogeneous complex contents (image 37. The IUD is identified in the lower uterine segment / endocervical canal (image 25, 42).  Right ovary  Measurements: 4.3 x 1.8 x 1.9 cm. Normal appearance/no adnexal mass.  Left ovary  Measurements: 3.1 x 2.1 x 2.1 cm. Dominant follicle measuring 18 mm, simple. Normal appearance/no adnexal mass.  Other findings  No free fluid.  IMPRESSION: 1. IUD appears to be migrating out of the uterus, projects at the level of the cervix. 2. Proximal to the IUD there is bulky, complex avascular material/debris within the endometrial canal measuring up to 3 cm in thickness. 3. No free fluid.  Normal ovaries.   Electronically Signed   By: Lars Pinks M.D.   On: 04/09/2013 21:12    EKG Interpretation   None       MDM   Final diagnoses:  IUD complication  Vaginal bleeding   Patient presenting with vaginal bleeding. She is well-appearing in no apparent distress. Hemodynamically stable. Tachycardic. Large amount of vaginal bleeding with thick dark red  clots on exam. Pelvic US pending. Pt refusing pain medication at this time. 9:35 PM Laughs without acute finding. Pelvic ultrasound showing IUD migrated out of the  uterus, projecting at the level of the cervix, proximal to the IUD there is a bulky, complex avascular material within the endometrial canal to 3 cm. IUD removed, large, 3 cm x 1 cm blood clot surrounding IUD. Patient will followup with her gynecologist. Hemodynamically stable, stable for discharge. Return precautions given. Patient states understanding of treatment care plan and is agreeable.   Illene Labrador, PA-C 04/09/13 2137

## 2013-04-09 NOTE — ED Notes (Signed)
Pt from home c/o heavy vaginal bleeding and severe cramping. Pt reports that she had IUD placed approx 2 years ago and has not had a period until 5 days ago. Pt states that vaginal bleeding has been progressively getting heavier and cramping is worse. Pt denies fever, N/V/D. Pt is A&O and in NAD

## 2013-04-10 NOTE — ED Provider Notes (Signed)
Medical screening examination/treatment/procedure(s) were performed by non-physician practitioner and as supervising physician I was immediately available for consultation/collaboration.  EKG Interpretation   None         Houston Siren III, MD 04/10/13 [phone]

## 2013-04-13 ENCOUNTER — Encounter: Payer: Self-pay | Admitting: Family Medicine

## 2013-04-13 ENCOUNTER — Ambulatory Visit (INDEPENDENT_AMBULATORY_CARE_PROVIDER_SITE_OTHER): Payer: BC Managed Care – PPO | Admitting: Family Medicine

## 2013-04-13 VITALS — BP 124/70 | HR 125 | Temp 98.9°F | Ht 60.25 in | Wt 217.0 lb

## 2013-04-13 DIAGNOSIS — Z0000 Encounter for general adult medical examination without abnormal findings: Secondary | ICD-10-CM

## 2013-04-13 MED ORDER — ATORVASTATIN CALCIUM 40 MG PO TABS: 40.0000 mg | ORAL_TABLET | Freq: Every day | ORAL | Status: DC

## 2013-04-13 NOTE — Progress Notes (Signed)
   Subjective:    Patient ID: Zoe [name], female    DOB: Jul 19, 1970, 43 y.o.   MRN: [id number]  HPI 43 yr old female for a cpx. She is doing well. She was recently in the ER to have her IUD removed, and she will see her GYN today to discuss what to do in the future.    Review of Systems  Constitutional: Negative.   HENT: Negative.   Eyes: Negative.   Respiratory: Negative.   Cardiovascular: Negative.   Gastrointestinal: Negative.   Genitourinary: Negative for dysuria, urgency, frequency, hematuria, flank pain, decreased urine volume, enuresis, difficulty urinating, pelvic pain and dyspareunia.  Musculoskeletal: Negative.   Skin: Negative.   Neurological: Negative.   Psychiatric/Behavioral: Negative.        Objective:   Physical Exam  Constitutional: She is oriented to person, place, and time. She appears well-developed and well-nourished. No distress.  HENT:  Head: Normocephalic and atraumatic.  Right Ear: External ear normal.  Left Ear: External ear normal.  Nose: Nose normal.  Mouth/Throat: Oropharynx is clear and moist. No oropharyngeal exudate.  Eyes: Conjunctivae and EOM are normal. Pupils are equal, round, and reactive to light. No scleral icterus.  Neck: Normal range of motion. Neck supple. No JVD present. No thyromegaly present.  Cardiovascular: Normal rate, regular rhythm, normal heart sounds and intact distal pulses.  Exam reveals no gallop and no friction rub.   No murmur heard. Pulmonary/Chest: Effort normal and breath sounds normal. No respiratory distress. She has no wheezes. She has no rales. She exhibits no tenderness.  Abdominal: Soft. Bowel sounds are normal. She exhibits no distension and no mass. There is no tenderness. There is no rebound and no guarding.  Musculoskeletal: Normal range of motion. She exhibits no edema and no tenderness.  Lymphadenopathy:    She has no cervical adenopathy.  Neurological: She is alert and oriented to person, place,  and time. She has normal reflexes. No cranial nerve deficit. She exhibits normal muscle tone. Coordination normal.  Skin: Skin is warm and dry. No rash noted. No erythema.  Psychiatric: She has a normal mood and affect. Her behavior is normal. Judgment and thought content normal.          Assessment & Plan:  Well exam.

## 2013-04-13 NOTE — Progress Notes (Signed)
Pre visit review using our clinic review tool, if applicable. No additional management support is needed unless otherwise documented below in the visit note. 

## 2013-04-15 NOTE — Telephone Encounter (Signed)
Pt cancelled all visits, closing encounters

## 2013-05-19 ENCOUNTER — Other Ambulatory Visit: Payer: Self-pay | Admitting: Neurology

## 2013-05-26 ENCOUNTER — Encounter: Payer: Self-pay | Admitting: Physician Assistant

## 2013-05-26 ENCOUNTER — Ambulatory Visit: Payer: BC Managed Care – PPO | Admitting: Family Medicine

## 2013-05-26 ENCOUNTER — Ambulatory Visit (INDEPENDENT_AMBULATORY_CARE_PROVIDER_SITE_OTHER): Payer: BC Managed Care – PPO | Admitting: Physician Assistant

## 2013-05-26 VITALS — BP 116/88 | HR 89 | Temp 98.3°F | Resp 16 | Ht 60.0 in | Wt 218.0 lb

## 2013-05-26 DIAGNOSIS — J209 Acute bronchitis, unspecified: Secondary | ICD-10-CM

## 2013-05-26 MED ORDER — HYDROCOD POLST-CHLORPHEN POLST 10-8 MG/5ML PO LQCR: 5.0000 mL | Freq: Two times a day (BID) | ORAL | Status: DC | PRN

## 2013-05-26 MED ORDER — SULFAMETHOXAZOLE-TMP DS 800-160 MG PO TABS
1.0000 | ORAL_TABLET | Freq: Two times a day (BID) | ORAL | Status: DC
Start: 2013-05-26 — End: 2013-06-30

## 2013-05-26 NOTE — Progress Notes (Signed)
Patient presents to clinic today c/o 1 week of sinus pressure, chest congestion, persistent cough that is sometimes productive, ear pressure, and post-nasal drip.  Symptoms have worsened daily.  Unsure of fever.  Denies recent travel or sick contact. Has history of severe seasonal allergies for which she is on multiple medications.  Past Medical History  Diagnosis Date  . Allergy   . Anemia   . Chickenpox   . Osteoarthritis   . Menorrhagia   . Benign hematuria     worked up by Dr. Kathie Rhodes  . Horseshoe kidney     Current Outpatient Prescriptions on File Prior to Visit  Medication Sig Dispense Refill  . ALPRAZolam (XANAX) 1 MG tablet Take 1 tablet (1 mg total) by mouth 2 (two) times daily as needed for anxiety.  60 tablet  2  . aspirin-acetaminophen-caffeine (EXCEDRIN MIGRAINE) [id number] MG per tablet Take 2 tablets by mouth every 6 (six) hours as needed for headache.      Marland Kitchen atorvastatin (LIPITOR) 40 MG tablet Take 1 tablet (40 mg total) by mouth at bedtime.  30 tablet  11  . BEPREVE 1.5 % SOLN Place 1 drop into both eyes at bedtime.      Marland Kitchen EPINEPHrine (EPI-PEN) 0.3 mg/0.3 mL DEVI Inject 0.3 mg into the muscle once.      Marland Kitchen HYDROcodone-acetaminophen (NORCO/VICODIN) 5-325 MG per tablet Take 1-2 tablets by mouth every 4 (four) hours as needed.  10 tablet  0  . levocetirizine (XYZAL) 5 MG tablet Take 5 mg by mouth every evening.      . mometasone (NASONEX) 50 MCG/ACT nasal spray Place 2 sprays into the nose daily.      . montelukast (SINGULAIR) 10 MG tablet Take 10 mg by mouth at bedtime.      . nortriptyline (PAMELOR) 10 MG capsule TAKE 4 CAPSULES AT BEDTIME  120 capsule  1  . rizatriptan (MAXALT-MLT) 10 MG disintegrating tablet Take 10 mg by mouth. At onset of acute migraine. May repeat in 2 hours if needed      . [DISCONTINUED] fexofenadine (ALLEGRA) 180 MG tablet Take 180 mg by mouth daily.        . [DISCONTINUED] Norgestimate-Ethinyl Estradiol Triphasic (TRI-SPRINTEC) 0.18/0.215/0.25  MG-35 MCG tablet Take 1 tablet by mouth daily.  28 tablet  11   No current facility-administered medications on file prior to visit.    Allergies  Allergen Reactions  . Penicillins     REACTION: hives    Family History  Problem Relation Age of Onset  . Arthritis    . Coronary artery disease    . Depression    . Diabetes    . Hyperlipidemia    . Sudden death    . Colon polyps Brother     History   Social History  . Marital Status: Single    Spouse Name: N/A    Number of Children: N/A  . Years of Education: N/A   Social History Main Topics  . Smoking status: Never Smoker   . Smokeless tobacco: Never Used  . Alcohol Use: No  . Drug Use: No  . Sexual Activity: None   Other Topics Concern  . None   Social History Narrative  . None   Review of Systems - See HPI.  All other ROS are negative.  BP 116/88  Pulse 89  Temp(Src) 98.3 F (36.8 C) (Oral)  Resp 16  Ht 5' (1.524 m)  Wt 218 lb (98.884 kg)  BMI 42.58 kg/m2  SpO2 98%  LMP 04/10/2013  Physical Exam  Vitals reviewed. Constitutional: She is oriented to person, place, and time and well-developed, well-nourished, and in no distress.  HENT:  Head: Normocephalic and atraumatic.  Right Ear: External ear normal.  Left Ear: External ear normal.  Nose: Nose normal.  Mouth/Throat: Oropharynx is clear and moist. No oropharyngeal exudate.  TM within normal limits bilaterally.  Eyes: Conjunctivae are normal. Pupils are equal, round, and reactive to light.  Neck: Neck supple.  Cardiovascular: Normal rate, regular rhythm, normal heart sounds and intact distal pulses.   Pulmonary/Chest: Effort normal and breath sounds normal. No respiratory distress. She has no wheezes. She has no rales. She exhibits no tenderness.  Neurological: She is alert and oriented to person, place, and time.  Skin: Skin is warm and dry.   Recent Results (from the past 2160 hour(s))  BASIC METABOLIC PANEL     Status: None   Collection Time     04/06/13  9:49 AM      Result Value Ref Range   Sodium 141  135 - 145 mEq/L   Potassium 4.5  3.5 - 5.1 mEq/L   Chloride 107  96 - 112 mEq/L   CO2 25  19 - 32 mEq/L   Glucose, Bld 97  70 - 99 mg/dL   BUN 11  6 - 23 mg/dL   Creatinine, Ser 0.6  0.4 - 1.2 mg/dL   Calcium 8.8  8.4 - 10.5 mg/dL   GFR 115.71  >60.00 mL/min  CBC WITH DIFFERENTIAL     Status: Abnormal   Collection Time    04/06/13  9:49 AM      Result Value Ref Range   WBC 11.3 (*) 4.5 - 10.5 K/uL   RBC 4.49  3.87 - 5.11 Mil/uL   Hemoglobin 13.9  12.0 - 15.0 g/dL   HCT 41.7  36.0 - 46.0 %   MCV 92.9  78.0 - 100.0 fl   MCHC 33.3  30.0 - 36.0 g/dL   RDW 14.0  11.5 - 14.6 %   Platelets 306.0  150.0 - 400.0 K/uL   Neutrophils Relative % 64.0  43.0 - 77.0 %   Lymphocytes Relative 25.8  12.0 - 46.0 %   Monocytes Relative 6.6  3.0 - 12.0 %   Eosinophils Relative 3.1  0.0 - 5.0 %   Basophils Relative 0.5  0.0 - 3.0 %   Neutro Abs 7.2  1.4 - 7.7 K/uL   Lymphs Abs 2.9  0.7 - 4.0 K/uL   Monocytes Absolute 0.7  0.1 - 1.0 K/uL   Eosinophils Absolute 0.3  0.0 - 0.7 K/uL   Basophils Absolute 0.1  0.0 - 0.1 K/uL  HEPATIC FUNCTION PANEL     Status: Abnormal   Collection Time    04/06/13  9:49 AM      Result Value Ref Range   Total Bilirubin 0.7  0.3 - 1.2 mg/dL   Bilirubin, Direct 0.1  0.0 - 0.3 mg/dL   Alkaline Phosphatase 90  39 - 117 U/L   AST 34  0 - 37 U/L   ALT 46 (*) 0 - 35 U/L   Total Protein 7.4  6.0 - 8.3 g/dL   Albumin 3.6  3.5 - 5.2 g/dL  TSH     Status: None   Collection Time    04/06/13  9:49 AM      Result Value Ref Range   TSH 4.03  0.35 - 5.50 uIU/mL  LIPID PANEL  Status: None   Collection Time    04/06/13  9:49 AM      Result Value Ref Range   Cholesterol 138  0 - 200 mg/dL   Comment: ATP III Classification       Desirable:  < 200 mg/dL               Borderline High:  200 - 239 mg/dL          High:  > = 240 mg/dL   Triglycerides 110.0  0.0 - 149.0 mg/dL   Comment: Normal:  <150 mg/dLBorderline  High:  150 - 199 mg/dL   HDL 40.10  >39.00 mg/dL   VLDL 22.0  0.0 - 40.0 mg/dL   LDL Cholesterol 76  0 - 99 mg/dL   Total CHOL/HDL Ratio 3     Comment:                Men          Women1/2 Average Risk     3.4          3.3Average Risk          5.0          4.42X Average Risk          9.6          7.13X Average Risk          15.0          11.0                      POCT URINALYSIS DIPSTICK     Status: None   Collection Time    04/06/13 11:07 AM      Result Value Ref Range   Color, UA yellow     Clarity, UA clear     Glucose, UA n     Bilirubin, UA n     Ketones, UA n     Spec Grav, UA 1.025     Blood, UA 3+     pH, UA 5.5     Protein, UA 2+     Urobilinogen, UA 0.2     Nitrite, UA n     Leukocytes, UA Negative    POCT PREGNANCY, URINE     Status: None   Collection Time    04/09/13  6:37 PM      Result Value Ref Range   Preg Test, Ur NEGATIVE  NEGATIVE   Comment:            THE SENSITIVITY OF THIS     METHODOLOGY IS >24 mIU/mL  CBC     Status: Abnormal   Collection Time    04/09/13  6:39 PM      Result Value Ref Range   WBC 15.0 (*) 4.0 - 10.5 K/uL   RBC 4.77  3.87 - 5.11 MIL/uL   Hemoglobin 14.8  12.0 - 15.0 g/dL   HCT 42.4  36.0 - 46.0 %   MCV 88.9  78.0 - 100.0 fL   MCH 31.0  26.0 - 34.0 pg   MCHC 34.9  30.0 - 36.0 g/dL   RDW 13.3  11.5 - 15.5 %   Platelets 311  150 - 400 K/uL  POCT I-STAT, CHEM 8     Status: Abnormal   Collection Time    04/09/13  6:58 PM      Result Value Ref Range   Sodium 140  137 - 147 mEq/L  Potassium 3.9  3.7 - 5.3 mEq/L   Chloride 103  96 - 112 mEq/L   BUN 4 (*) 6 - 23 mg/dL   Creatinine, Ser 0.70  0.50 - 1.10 mg/dL   Glucose, Bld 99  70 - 99 mg/dL   Calcium, Ion 1.14  1.12 - 1.23 mmol/L   TCO2 21  0 - 100 mmol/L   Hemoglobin 15.3 (*) 12.0 - 15.0 g/dL   HCT 45.0  36.0 - 46.0 %   Assessment/Plan: ACUTE BRONCHITIS Rx Bactrim.  Increase fluid intake.  Rx Tussionex.  Saline nasal spray.  Flonase.  Continue allergy medications.   Probiotic.  Humidifier in bedroom.  Plain Mucinex.  Call or return to clinic if symptoms are not improving.

## 2013-05-26 NOTE — Assessment & Plan Note (Signed)
Rx Bactrim.  Increase fluid intake.  Rx Tussionex.  Saline nasal spray.  Flonase.  Continue allergy medications.  Probiotic.  Humidifier in bedroom.  Plain Mucinex.  Call or return to clinic if symptoms are not improving.

## 2013-05-26 NOTE — Patient Instructions (Signed)
Please take antibiotic as prescribed with food.  Use Tussionex as directed for cough.  Increase fluids.  Rest.  Saline nasal spray.  Humidifier the bedroom.  Acute Bronchitis Bronchitis is when the airways that extend from the windpipe into the lungs get red, puffy, and painful (inflamed). Bronchitis often causes thick spit (mucus) to develop. This leads to a cough. A cough is the most common symptom of bronchitis. In acute bronchitis, the condition usually begins suddenly and goes away over time (usually in 2 weeks). Smoking, allergies, and asthma can make bronchitis worse. Repeated episodes of bronchitis may cause more lung problems. HOME CARE  Rest.  Drink enough fluids to keep your pee (urine) clear or pale yellow (unless you need to limit fluids as told by your doctor).  Only take over-the-counter or prescription medicines as told by your doctor.  Avoid smoking and secondhand smoke. These can make bronchitis worse. If you are a smoker, think about using nicotine gum or skin patches. Quitting smoking will help your lungs heal faster.  Reduce the chance of getting bronchitis again by:  Washing your hands often.  Avoiding people with cold symptoms.  Trying not to touch your hands to your mouth, nose, or eyes.  Follow up with your doctor as told. GET HELP IF: Your symptoms do not improve after 1 week of treatment. Symptoms include:  Cough.  Fever.  Coughing up thick spit.  Body aches.  Chest congestion.  Chills.  Shortness of breath.  Sore throat. GET HELP RIGHT AWAY IF:   You have an increased fever.  You have chills.  You have severe shortness of breath.  You have bloody thick spit (sputum).  You throw up (vomit) often.  You lose too much body fluid (dehydration).  You have a severe headache.  You faint. MAKE SURE YOU:   Understand these instructions.  Will watch your condition.  Will get help right away if you are not doing well or get  worse. Document Released: 07/30/2007 Document Revised: 10/13/2012 Document Reviewed: 08/03/2012 [facility] Patient Information 2014 [place].

## 2013-06-08 ENCOUNTER — Ambulatory Visit (INDEPENDENT_AMBULATORY_CARE_PROVIDER_SITE_OTHER): Payer: BC Managed Care – PPO | Admitting: Family Medicine

## 2013-06-08 ENCOUNTER — Encounter: Payer: Self-pay | Admitting: Family Medicine

## 2013-06-08 VITALS — BP 118/80 | HR 118 | Temp 99.3°F | Ht 60.0 in | Wt 213.0 lb

## 2013-06-08 DIAGNOSIS — K5289 Other specified noninfective gastroenteritis and colitis: Secondary | ICD-10-CM

## 2013-06-08 DIAGNOSIS — K529 Noninfective gastroenteritis and colitis, unspecified: Secondary | ICD-10-CM

## 2013-06-08 MED ORDER — CIPROFLOXACIN HCL 500 MG PO TABS: 500.0000 mg | ORAL_TABLET | Freq: Two times a day (BID) | ORAL | Status: DC

## 2013-06-08 MED ORDER — DIPHENOXYLATE-ATROPINE 2.5-0.025 MG PO TABS: 2.0000 | ORAL_TABLET | Freq: Four times a day (QID) | ORAL | Status: DC | PRN

## 2013-06-08 NOTE — Progress Notes (Signed)
Pre visit review using our clinic review tool, if applicable. No additional management support is needed unless otherwise documented below in the visit note. 

## 2013-06-08 NOTE — Progress Notes (Signed)
   Subjective:    Patient ID: Zoe [name], female    DOB: 12-08-1970, 43 y.o.   MRN: [id number]  HPI Here for 6 days of watery diarrhea and mild abdominal cramps. Some slight nausea but no vomiting. No fever. She is using Imodium with no relief. No recent travel.    Review of Systems  Constitutional: Negative.   HENT: Negative.   Respiratory: Negative.   Gastrointestinal: Positive for nausea and diarrhea. Negative for vomiting, abdominal pain, constipation, blood in stool, abdominal distention and rectal pain.  Genitourinary: Negative.        Objective:   Physical Exam  Constitutional: She appears well-developed and well-nourished. No distress.  Pulmonary/Chest: Effort normal and breath sounds normal.  Abdominal: Soft. Bowel sounds are normal. She exhibits no distension and no mass. There is no rebound and no guarding.  Mildly tender in the lower abdomen           Assessment & Plan:  Drink plenty of fluids.

## 2013-06-29 ENCOUNTER — Encounter: Payer: Self-pay | Admitting: *Deleted

## 2013-06-30 ENCOUNTER — Ambulatory Visit (INDEPENDENT_AMBULATORY_CARE_PROVIDER_SITE_OTHER): Payer: BC Managed Care – PPO | Admitting: Nurse Practitioner

## 2013-06-30 ENCOUNTER — Encounter: Payer: Self-pay | Admitting: Nurse Practitioner

## 2013-06-30 VITALS — BP 120/73 | HR 85 | Ht 59.5 in | Wt 220.0 lb

## 2013-06-30 DIAGNOSIS — G43909 Migraine, unspecified, not intractable, without status migrainosus: Secondary | ICD-10-CM | POA: Insufficient documentation

## 2013-06-30 MED ORDER — NORTRIPTYLINE HCL 10 MG PO CAPS: 40.0000 mg | ORAL_CAPSULE | Freq: Every day | ORAL | Status: DC

## 2013-06-30 NOTE — Progress Notes (Signed)
[facility]  PATIENT: Zoe [name] DOB: [birth year]   REASON FOR VISIT: for follow of headache    HISTORY OF PRESENT ILLNESS: Zoe [name], 43 year old female returns for followup. She was last seen 03/26/2012. She continues to take nortriptyline 40 mg at at bedtime. She occasionally takes Maxalt acutely her headaches are in good control. She returns for reevaluation.    HISTORY: She denies previous history of headaches, but over past one year, she began to have frequent bilateral frontal, and retrorbital area headaches, pressure, 4-5 times a week she had a history of sinus infection, she was seen by ENT first, image fail to demonstrate sinusitis. She denies signficiant light sensitivity, headache last for 3-4 hours, not relieved by tylenol, ibprofen.   She denies vision changes. She has anemia due to heavy menses, she also has insomnia.  Marland Kitchen REVIEW OF SYSTEMS: Full 14 system review of systems performed and notable only for those listed, all others are neg:  Constitutional: N/A  Cardiovascular: N/A  Ear/Nose/Throat: N/A  Skin: N/A  Eyes: N/A  Respiratory: N/A  Gastroitestinal: N/A  Hematology/Lymphatic: N/A  Endocrine: N/A Musculoskeletal:N/A  Allergy/Immunology: N/A  Neurological: N/A Psychiatric: N/A Sleep : NA   ALLERGIES: Allergies  Allergen Reactions  . Penicillins     REACTION: hives    HOME MEDICATIONS: Outpatient Prescriptions Prior to Visit  Medication Sig Dispense Refill  . aspirin-acetaminophen-caffeine (EXCEDRIN MIGRAINE) 250-250-65 MG per tablet Take 2 tablets by mouth every 6 (six) hours as needed for headache.      Marland Kitchen atorvastatin (LIPITOR) 40 MG tablet Take 1 tablet (40 mg total) by mouth at bedtime.  30 tablet  11  . BEPREVE 1.5 % SOLN Place 1 drop into both eyes at bedtime.      . diphenoxylate-atropine (LOMOTIL) 2.5-0.025 MG per tablet Take 2 tablets by mouth 4 (four) times daily as needed for diarrhea or loose stools.  60  tablet  1  . EPINEPHrine (EPI-PEN) 0.3 mg/0.3 mL DEVI Inject 0.3 mg into the muscle once.      Marland Kitchen HYDROcodone-acetaminophen (NORCO/VICODIN) 5-325 MG per tablet Take 1-2 tablets by mouth every 4 (four) hours as needed.  10 tablet  0  . levocetirizine (XYZAL) 5 MG tablet Take 5 mg by mouth every evening.      . mometasone (NASONEX) 50 MCG/ACT nasal spray Place 2 sprays into the nose daily.      . montelukast (SINGULAIR) 10 MG tablet Take 10 mg by mouth at bedtime.      . nortriptyline (PAMELOR) 10 MG capsule TAKE 4 CAPSULES AT BEDTIME  120 capsule  1  . rizatriptan (MAXALT-MLT) 10 MG disintegrating tablet Take 10 mg by mouth. At onset of acute migraine. May repeat in 2 hours if needed      . ALPRAZolam (XANAX) 1 MG tablet Take 1 tablet (1 mg total) by mouth 2 (two) times daily as needed for anxiety.  60 tablet  2  . chlorpheniramine-HYDROcodone (TUSSIONEX PENNKINETIC ER) 10-8 MG/5ML LQCR Take 5 mLs by mouth every 12 (twelve) hours as needed.  115 mL  0  . ciprofloxacin (CIPRO) 500 MG tablet Take 1 tablet (500 mg total) by mouth 2 (two) times daily.  20 tablet  0  . sulfamethoxazole-trimethoprim (BACTRIM DS) 800-160 MG per tablet Take 1 tablet by mouth 2 (two) times daily.  14 tablet  0   No facility-administered medications prior to visit.    PAST MEDICAL HISTORY: Past Medical History  Diagnosis Date  . Allergy   .  Anemia   . Chickenpox   . Osteoarthritis   . Menorrhagia   . Benign hematuria     worked up by Dr. Kathie Rhodes  . Horseshoe kidney     PAST SURGICAL HISTORY: Past Surgical History  Procedure Laterality Date  . Cesarean section    . Tubal ligation    . Tympanostomy      FAMILY HISTORY: Family History  Problem Relation Age of Onset  . Arthritis    . Coronary artery disease    . Depression    . Diabetes    . Hyperlipidemia    . Sudden death    . Colon polyps Brother     SOCIAL HISTORY: History   Social History  . Marital Status: Single    Spouse Name: N/A      Number of Children: 3  . Years of Education: 12+   Occupational History  .  [place] History Main Topics  . Smoking status: Never Smoker   . Smokeless tobacco: Never Used  . Alcohol Use: No  . Drug Use: No  . Sexual Activity: Not on file   Other Topics Concern  . Not on file   Social History Narrative   Patient is married.    Patient has 3 children.    Patient lives with parents.    Patient is employed with [employer] and [employer]    Patient has some college.      PHYSICAL EXAM  Filed Vitals:   06/30/13 1407  BP: 120/73  Pulse: 85  Height: 4' 11.5" (1.511 m)  Weight: 220 lb (99.791 kg)   Body mass index is 43.71 kg/(m^2).  Generalized: Well developed, in no acute distress    Neurological examination   Mentation: Alert oriented to time, place, history taking. Follows all commands speech and language fluent  Cranial nerve II-XII: Pupils were equal round reactive to light extraocular movements were full, visual field were full on confrontational test. Facial sensation and strength were normal. hearing was intact to finger rubbing bilaterally. Uvula tongue midline. head turning and shoulder shrug were normal and symmetric.Tongue protrusion into cheek strength was normal. Motor: normal bulk and tone, full strength in the BUE, BLE, fine finger movements normal, no pronator drift. No focal weakness Coordination: finger-nose-finger, heel-to-shin bilaterally, no dysmetria Reflexes: Brachioradialis 2/2, biceps 2/2, triceps 2/2, patellar 2/2, Achilles 2/2, plantar responses were flexor bilaterally. Gait and Station: Rising up from seated position without assistance, normal stance,  moderate stride, good arm swing, smooth turning, able to perform tiptoe, and heel walking without difficulty. Tandem gait is steady  DIAGNOSTIC DATA (LABS, IMAGING, TESTING) - I reviewed patient records, labs, notes, testing and imaging myself where available.  Lab Results   Component Value Date   WBC 15.0* 04/09/2013   HGB 15.3* 04/09/2013   HCT 45.0 04/09/2013   MCV 88.9 04/09/2013   PLT 311 04/09/2013      Component Value Date/Time   NA 140 04/09/2013 1858   K 3.9 04/09/2013 1858   CL 103 04/09/2013 1858   CO2 25 04/06/2013 0949   GLUCOSE 99 04/09/2013 1858   BUN 4* 04/09/2013 1858   CREATININE 0.70 04/09/2013 1858   CALCIUM 8.8 04/06/2013 0949   PROT 7.4 04/06/2013 0949   ALBUMIN 3.6 04/06/2013 0949   AST 34 04/06/2013 0949   ALT 46* 04/06/2013 0949   ALKPHOS 90 04/06/2013 0949   BILITOT 0.7 04/06/2013 0949   GFRNONAA >60 01/11/2010 4854   GFRAA  Value: >60        The eGFR has been calculated using the MDRD equation. This calculation has not been validated in all clinical situations. eGFR's persistently <60 mL/min signify possible Chronic Kidney Disease. 01/11/2010 0852   Lab Results  Component Value Date   CHOL 138 04/06/2013   HDL 40.10 04/06/2013   LDLCALC 76 04/06/2013   LDLDIRECT 137.1 07/31/2009   TRIG 110.0 04/06/2013   CHOLHDL 3 04/06/2013    Lab Results  Component Value Date   TSH 4.03 04/06/2013    ASSESSMENT AND PLAN  43 y.o. year old female  has a past medical history of migraine headaches here to follow up. Continue Nortriptyline at current dose F/U yearly Dennie Bible, [facility], [facility], APRN  [facility] [address] [place], [place] [address] [phone]

## 2013-06-30 NOTE — Patient Instructions (Signed)
Continue Nortriptyline at current dose F/U yearly

## 2013-07-16 ENCOUNTER — Other Ambulatory Visit: Payer: Self-pay | Admitting: Neurology

## 2013-07-28 ENCOUNTER — Encounter: Payer: Self-pay | Admitting: Family Medicine

## 2013-07-28 ENCOUNTER — Ambulatory Visit (INDEPENDENT_AMBULATORY_CARE_PROVIDER_SITE_OTHER): Payer: BC Managed Care – PPO | Admitting: Family Medicine

## 2013-07-28 VITALS — BP 130/85 | HR 94 | Temp 99.0°F | Ht 60.0 in | Wt 218.0 lb

## 2013-07-28 DIAGNOSIS — B353 Tinea pedis: Secondary | ICD-10-CM

## 2013-07-28 DIAGNOSIS — L0390 Cellulitis, unspecified: Secondary | ICD-10-CM

## 2013-07-28 DIAGNOSIS — L0291 Cutaneous abscess, unspecified: Secondary | ICD-10-CM

## 2013-07-28 MED ORDER — CEPHALEXIN 500 MG PO CAPS: 500.0000 mg | ORAL_CAPSULE | Freq: Three times a day (TID) | ORAL | Status: AC

## 2013-07-28 MED ORDER — KETOCONAZOLE 2 % EX CREA: 1.0000 "application " | TOPICAL_CREAM | Freq: Two times a day (BID) | CUTANEOUS | Status: DC

## 2013-07-28 NOTE — Progress Notes (Signed)
Pre visit review using our clinic review tool, if applicable. No additional management support is needed unless otherwise documented below in the visit note. 

## 2013-07-28 NOTE — Progress Notes (Signed)
   Subjective:    Patient ID: Zoe [name], female    DOB: Jun 08, 1970, 43 y.o.   MRN: [id number]  HPI Here for pain and oozing of fluid from the left foot since last night. No fever.    Review of Systems  Constitutional: Negative.   Skin: Positive for wound.       Objective:   Physical Exam  Constitutional: She appears well-developed and well-nourished. No distress.  Skin:  The skin between the 1st and 2nd toes of the left foot is macerated and red, draining clear fluid           Assessment & Plan:  She has athlete's foot between the toes which now has a cellulitis. Use Keflex and ketoconazole

## 2013-08-10 ENCOUNTER — Other Ambulatory Visit: Payer: Self-pay | Admitting: Family Medicine

## 2013-08-11 NOTE — Telephone Encounter (Signed)
Per Dr. Sarajane Jews, can change to read take 1 po bid prn of the Xanax.

## 2013-08-11 NOTE — Telephone Encounter (Signed)
Call in #[phone] with 5 rf 

## 2013-08-12 ENCOUNTER — Other Ambulatory Visit: Payer: Self-pay | Admitting: Obstetrics and Gynecology

## 2013-08-16 LAB — CYTOLOGY - PAP

## 2013-09-19 ENCOUNTER — Ambulatory Visit (INDEPENDENT_AMBULATORY_CARE_PROVIDER_SITE_OTHER): Payer: BC Managed Care – PPO | Admitting: Family Medicine

## 2013-09-19 ENCOUNTER — Encounter: Payer: Self-pay | Admitting: Family Medicine

## 2013-09-19 VITALS — BP 119/88 | HR 106 | Temp 98.8°F | Ht 60.0 in | Wt 215.0 lb

## 2013-09-19 DIAGNOSIS — L509 Urticaria, unspecified: Secondary | ICD-10-CM | POA: Insufficient documentation

## 2013-09-19 MED ORDER — PREDNISONE 10 MG PO TABS: ORAL_TABLET | ORAL | Status: DC

## 2013-09-19 NOTE — Progress Notes (Signed)
Pre visit review using our clinic review tool, if applicable. No additional management support is needed unless otherwise documented below in the visit note. 

## 2013-09-19 NOTE — Progress Notes (Signed)
   Subjective:    Patient ID: Zoe [name], female    DOB: 05/03/1970, 43 y.o.   MRN: [id number]  HPI Here for the onset last night of diffuse rash and itching. No SOB or tongue swelling. She cannot think of any new meds or foods or other possible triggers.    Review of Systems  Constitutional: Negative.   HENT: Negative.   Respiratory: Negative.   Cardiovascular: Negative.   Skin: Positive for rash.  Neurological: Negative.        Objective:   Physical Exam  Constitutional: She appears well-developed and well-nourished. No distress.  Cardiovascular: Normal rate, regular rhythm, normal heart sounds and intact distal pulses.   Pulmonary/Chest: Effort normal and breath sounds normal.  Skin:  Diffuse urticaria           Assessment & Plan:  Use Benadryl 25-50 mg every 4 hours and Zantac 150 mg bid. Add a prednisone taper. Recheck prn

## 2013-11-03 ENCOUNTER — Other Ambulatory Visit: Payer: Self-pay | Admitting: Neurology

## 2013-11-07 ENCOUNTER — Encounter: Payer: Self-pay | Admitting: Physician Assistant

## 2013-11-07 ENCOUNTER — Ambulatory Visit (INDEPENDENT_AMBULATORY_CARE_PROVIDER_SITE_OTHER): Payer: BC Managed Care – PPO | Admitting: Physician Assistant

## 2013-11-07 VITALS — BP 140/90 | HR 96 | Temp 98.2°F | Resp 18 | Ht 60.0 in | Wt 214.5 lb

## 2013-11-07 DIAGNOSIS — J019 Acute sinusitis, unspecified: Secondary | ICD-10-CM

## 2013-11-07 MED ORDER — DOXYCYCLINE HYCLATE 100 MG PO TABS: 100.0000 mg | ORAL_TABLET | Freq: Two times a day (BID) | ORAL | Status: DC

## 2013-11-07 NOTE — Progress Notes (Signed)
Subjective:    Patient ID: Zoe [name], female    DOB: 11/30/1970, 43 y.o.   MRN: [id number]  Sinusitis This is a new problem. The current episode started 1 to 4 weeks ago. The problem has been gradually worsening since onset. There has been no fever. Her pain is at a severity of 8/10. The pain is severe. Associated symptoms include congestion, coughing, headaches, a hoarse voice and sinus pressure. Pertinent negatives include no chills, diaphoresis, ear pain, neck pain, shortness of breath, sneezing, sore throat or swollen glands. Treatments tried: excedrin, mucinex, xyxal and singulair. The treatment provided no relief.     Review of Systems  Constitutional: Negative for fever, chills and diaphoresis.  HENT: Positive for congestion, hoarse voice, postnasal drip and sinus pressure. Negative for ear pain, sneezing and sore throat.   Respiratory: Positive for cough. Negative for shortness of breath.   Cardiovascular: Negative for chest pain.  Gastrointestinal: Negative for nausea, vomiting and diarrhea.  Musculoskeletal: Negative for neck pain.  Neurological: Positive for headaches. Negative for syncope.  All other systems reviewed and are negative.    Past Medical History  Diagnosis Date  . Allergy   . Anemia   . Chickenpox   . Osteoarthritis   . Menorrhagia   . Benign hematuria     worked up by Dr. Kathie Rhodes  . Horseshoe kidney     History   Social History  . Marital Status: Single    Spouse Name: N/A    Number of Children: 3  . Years of Education: 12+   Occupational History  .  [place] History Main Topics  . Smoking status: Never Smoker   . Smokeless tobacco: Never Used  . Alcohol Use: No  . Drug Use: No  . Sexual Activity: Not on file   Other Topics Concern  . Not on file   Social History Narrative   Patient is married.    Patient has 3 children.    Patient lives with parents.    Patient is employed with [employer] and [employer]    Patient has some college.     Past Surgical History  Procedure Laterality Date  . Cesarean section    . Tubal ligation    . Tympanostomy      Family History  Problem Relation Age of Onset  . Arthritis    . Coronary artery disease    . Depression    . Diabetes    . Hyperlipidemia    . Sudden death    . Colon polyps Brother     Allergies  Allergen Reactions  . Penicillins     REACTION: hives    Current Outpatient Prescriptions on File Prior to Visit  Medication Sig Dispense Refill  . ALPRAZolam (XANAX) 1 MG tablet TAKE 1 TABLET BY MOUTH TWICE A DAY AS NEEDED FOR ANXIETY  60 tablet  5  . atorvastatin (LIPITOR) 40 MG tablet Take 1 tablet (40 mg total) by mouth at bedtime.  30 tablet  11  . BEPREVE 1.5 % SOLN Place 1 drop into both eyes at bedtime.      Marland Kitchen EPINEPHrine (EPI-PEN) 0.3 mg/0.3 mL DEVI Inject 0.3 mg into the muscle once.      Marland Kitchen HYDROcodone-acetaminophen (NORCO/VICODIN) 5-325 MG per tablet Take 1-2 tablets by mouth every 4 (four) hours as needed.  10 tablet  0  . levocetirizine (XYZAL) 5 MG tablet Take 5 mg by mouth every evening.      Marland Kitchen  nortriptyline (PAMELOR) 10 MG capsule Take 4 capsules (40 mg total) by mouth at bedtime.  120 capsule  11  . rizatriptan (MAXALT-MLT) 10 MG disintegrating tablet Take 10 mg by mouth. At onset of acute migraine. May repeat in 2 hours if needed      . temazepam (RESTORIL) 30 MG capsule as needed.      . [DISCONTINUED] fexofenadine (ALLEGRA) 180 MG tablet Take 180 mg by mouth daily.        . [DISCONTINUED] Norgestimate-Ethinyl Estradiol Triphasic (TRI-SPRINTEC) 0.18/0.215/0.25 MG-35 MCG tablet Take 1 tablet by mouth daily.  28 tablet  11   No current facility-administered medications on file prior to visit.    EXAM: BP 140/90  Pulse 96  Temp(Src) 98.2 F (36.8 C) (Oral)  Resp 18  Ht 5' (1.524 m)  Wt 214 lb 8 oz (97.297 kg)  BMI 41.89 kg/m2  SpO2 98%     Objective:   Physical Exam  Nursing note and vitals  reviewed. Constitutional: She is oriented to person, place, and time. She appears well-developed and well-nourished. No distress.  HENT:  Head: Normocephalic and atraumatic.  Right Ear: External ear normal.  Left Ear: External ear normal.  Nose: Nose normal.  Mouth/Throat: No oropharyngeal exudate.  Oropharynx is slightly erythematous, no exudate. Bilateral TMs normal. Bilateral frontal and maxillary sinuses TTP.  Eyes: Conjunctivae and EOM are normal.  Neck: Normal range of motion. Neck supple.  Cardiovascular: Normal rate, regular rhythm and intact distal pulses.   Pulmonary/Chest: Effort normal and breath sounds normal. No stridor. No respiratory distress. She has no wheezes. She has no rales. She exhibits no tenderness.  Lymphadenopathy:    She has no cervical adenopathy.  Neurological: She is alert and oriented to person, place, and time.  Skin: Skin is warm and dry. She is not diaphoretic. No pallor.  Psychiatric: She has a normal mood and affect. Her behavior is normal. Judgment and thought content normal.     Lab Results  Component Value Date   WBC 15.0* 04/09/2013   HGB 15.3* 04/09/2013   HCT 45.0 04/09/2013   PLT 311 04/09/2013   GLUCOSE 99 04/09/2013   CHOL 138 04/06/2013   TRIG 110.0 04/06/2013   HDL 40.10 04/06/2013   LDLDIRECT 137.1 07/31/2009   LDLCALC 76 04/06/2013   ALT 46* 04/06/2013   AST 34 04/06/2013   NA 140 04/09/2013   K 3.9 04/09/2013   CL 103 04/09/2013   CREATININE 0.70 04/09/2013   BUN 4* 04/09/2013   CO2 25 04/06/2013   TSH 4.03 04/06/2013        Assessment & Plan:  Ciji was seen today for sinusitis.  Diagnoses and associated orders for this visit:  Acute sinusitis, recurrence not specified, unspecified location Comments: over 1 week, focal sinus ttp. will treat with Doxy, continue mucinex, nasal steroid, antihistamine, rest, push fluids. - doxycycline (VIBRA-TABS) 100 MG tablet; Take 1 tablet (100 mg total) by mouth 2 (two) times  daily.    Return precautions provided, and patient handout on sinusitis.  Plan to follow up as needed, or for worsening or persistent symptoms despite treatment.  Patient Instructions  Doxycycline twice daily for 10 days for sinus infection. Try to take with full meal to prevent nausea. Limit sun exposure and wear adequate sun protection, as this can cause increased sensitivity to sun.  Continue all of your other medications for your allergy symptoms, including your antihistamine, nasal steroid, and Mucinex to help thin the thick secretions.  Push  fluid hydration with water.  If emergency symptoms discussed during visit developed, seek medical attention immediately.  Followup as needed, or for worsening or persistent symptoms despite treatment.

## 2013-11-07 NOTE — Patient Instructions (Addendum)
Doxycycline twice daily for 10 days for sinus infection. Try to take with full meal to prevent nausea. Limit sun exposure and wear adequate sun protection, as this can cause increased sensitivity to sun.  Continue all of your other medications for your allergy symptoms, including your antihistamine, nasal steroid, and Mucinex to help thin the thick secretions.  Push fluid hydration with water.  If emergency symptoms discussed during visit developed, seek medical attention immediately.  Followup as needed, or for worsening or persistent symptoms despite treatment.    Sinusitis Sinusitis is redness, soreness, and puffiness (inflammation) of the air pockets in the bones of your face (sinuses). The redness, soreness, and puffiness can cause air and mucus to get trapped in your sinuses. This can allow germs to grow and cause an infection.  HOME CARE   Drink enough fluids to keep your pee (urine) clear or pale yellow.  Use a humidifier in your home.  Run a hot shower to create steam in the bathroom. Sit in the bathroom with the door closed. Breathe in the steam 3-4 times a day.  Put a warm, moist washcloth on your face 3-4 times a day, or as told by your doctor.  Use salt water sprays (saline sprays) to wet the thick fluid in your nose. This can help the sinuses drain.  Only take medicine as told by your doctor. GET HELP RIGHT AWAY IF:   Your pain gets worse.  You have very bad headaches.  You are sick to your stomach (nauseous).  You throw up (vomit).  You are very sleepy (drowsy) all the time.  Your face is puffy (swollen).  Your vision changes.  You have a stiff neck.  You have trouble breathing. MAKE SURE YOU:   Understand these instructions.  Will watch your condition.  Will get help right away if you are not doing well or get worse. Document Released: 07/30/2007 Document Revised: 11/05/2011 Document Reviewed: 09/16/2011 [facility] Patient Information 2015  [place], [place]. This information is not intended to replace advice given to you by your health care provider. Make sure you discuss any questions you have with your health care provider. Sinusitis Sinusitis is redness, soreness, and puffiness (inflammation) of the air pockets in the bones of your face (sinuses). The redness, soreness, and puffiness can cause air and mucus to get trapped in your sinuses. This can allow germs to grow and cause an infection.  HOME CARE   Drink enough fluids to keep your pee (urine) clear or pale yellow.  Use a humidifier in your home.  Run a hot shower to create steam in the bathroom. Sit in the bathroom with the door closed. Breathe in the steam 3-4 times a day.  Put a warm, moist washcloth on your face 3-4 times a day, or as told by your doctor.  Use salt water sprays (saline sprays) to wet the thick fluid in your nose. This can help the sinuses drain.  Only take medicine as told by your doctor. GET HELP RIGHT AWAY IF:   Your pain gets worse.  You have very bad headaches.  You are sick to your stomach (nauseous).  You throw up (vomit).  You are very sleepy (drowsy) all the time.  Your face is puffy (swollen).  Your vision changes.  You have a stiff neck.  You have trouble breathing. MAKE SURE YOU:   Understand these instructions.  Will watch your condition.  Will get help right away if you are not doing well or  get worse. Document Released: 07/30/2007 Document Revised: 11/05/2011 Document Reviewed: 09/16/2011 [facility] Patient Information 2015 [place], [place]. This information is not intended to replace advice given to you by your health care provider. Make sure you discuss any questions you have with your health care provider.

## 2013-11-07 NOTE — Progress Notes (Signed)
Pre visit review using our clinic review tool, if applicable. No additional management support is needed unless otherwise documented below in the visit note. 

## 2013-11-17 ENCOUNTER — Other Ambulatory Visit: Payer: Self-pay

## 2013-11-17 DIAGNOSIS — Z1231 Encounter for screening mammogram for malignant neoplasm of breast: Secondary | ICD-10-CM

## 2013-12-06 ENCOUNTER — Ambulatory Visit
Admission: RE | Admit: 2013-12-06 | Discharge: 2013-12-06 | Disposition: A | Discharge status: Home / Self Care | Payer: BC Managed Care – PPO | Source: Ambulatory Visit

## 2013-12-06 DIAGNOSIS — Z1231 Encounter for screening mammogram for malignant neoplasm of breast: Secondary | ICD-10-CM

## 2013-12-27 ENCOUNTER — Ambulatory Visit (INDEPENDENT_AMBULATORY_CARE_PROVIDER_SITE_OTHER): Payer: BC Managed Care – PPO | Admitting: Family Medicine

## 2013-12-27 ENCOUNTER — Encounter: Payer: Self-pay | Admitting: Family Medicine

## 2013-12-27 VITALS — BP 147/89 | HR 99 | Temp 98.8°F | Ht 60.0 in | Wt 216.0 lb

## 2013-12-27 DIAGNOSIS — J209 Acute bronchitis, unspecified: Secondary | ICD-10-CM

## 2013-12-27 MED ORDER — HYDROCODONE-HOMATROPINE 5-1.5 MG/5ML PO SYRP: 5.0000 mL | ORAL_SOLUTION | ORAL | Status: DC | PRN

## 2013-12-27 MED ORDER — AZITHROMYCIN 250 MG PO TABS: ORAL_TABLET | ORAL | Status: DC

## 2013-12-27 NOTE — Progress Notes (Signed)
Pre visit review using our clinic review tool, if applicable. No additional management support is needed unless otherwise documented below in the visit note. 

## 2013-12-27 NOTE — Progress Notes (Signed)
   Subjective:    Patient ID: Zoe [name], female    DOB: 1970/03/21, 43 y.o.   MRN: [id number]  HPI Here for one week of PND, chest tightness and a dry cough. No fever.    Review of Systems  Constitutional: Negative.   HENT: Positive for congestion and postnasal drip. Negative for sinus pressure.   Eyes: Negative.   Respiratory: Positive for cough.        Objective:   Physical Exam  Constitutional: She appears well-developed and well-nourished.  HENT:  Right Ear: External ear normal.  Left Ear: External ear normal.  Nose: Nose normal.  Mouth/Throat: Oropharynx is clear and moist.  Eyes: Conjunctivae are normal.  Pulmonary/Chest: Effort normal. No respiratory distress. She has no wheezes. She has no rales.  Scattered rhonchi   Lymphadenopathy:    She has no cervical adenopathy.          Assessment & Plan:  Add Mucinex

## 2013-12-28 ENCOUNTER — Telehealth: Payer: Self-pay | Admitting: Family Medicine

## 2013-12-28 NOTE — Telephone Encounter (Signed)
Please give her two notes, each saying she was out sick on the 3rd and the 4th

## 2013-12-28 NOTE — Telephone Encounter (Signed)
Pt seen by dr fry tues 11/4, pt needs dr note for being out of work yesterday and today. Pt works 2 jobs and needs 2 notes..  Pt would like to send someone to pu today if possible please.

## 2013-12-30 NOTE — Telephone Encounter (Signed)
Notes are ready for pick up and I left a message for pt.

## 2014-03-27 ENCOUNTER — Ambulatory Visit (INDEPENDENT_AMBULATORY_CARE_PROVIDER_SITE_OTHER): Payer: BC Managed Care – PPO | Admitting: Family Medicine

## 2014-03-27 ENCOUNTER — Encounter: Payer: Self-pay | Admitting: Family Medicine

## 2014-03-27 VITALS — BP 138/94 | HR 102 | Temp 98.9°F | Ht 60.0 in | Wt 213.0 lb

## 2014-03-27 DIAGNOSIS — J0100 Acute maxillary sinusitis, unspecified: Secondary | ICD-10-CM

## 2014-03-27 MED ORDER — AZITHROMYCIN 250 MG PO TABS: ORAL_TABLET | ORAL | Status: DC

## 2014-03-27 NOTE — Progress Notes (Signed)
   Subjective:    Patient ID: Zoe [name], female    DOB: 1970-11-27, 44 y.o.   MRN: [id number]  HPI Here for 4 days of sinus pressure, pain in both ears, PND, and a dry cough.    Review of Systems  Constitutional: Negative.   HENT: Positive for congestion, postnasal drip and sinus pressure.   Eyes: Negative.   Respiratory: Positive for cough.        Objective:   Physical Exam  Constitutional: She appears well-developed and well-nourished.  HENT:  Right Ear: External ear normal.  Left Ear: External ear normal.  Nose: Nose normal.  Mouth/Throat: Oropharynx is clear and moist.  Eyes: Conjunctivae are normal.  Pulmonary/Chest: Effort normal and breath sounds normal.  Lymphadenopathy:    She has no cervical adenopathy.          Assessment & Plan:  Add Mucinex D

## 2014-03-27 NOTE — Progress Notes (Signed)
Pre visit review using our clinic review tool, if applicable. No additional management support is needed unless otherwise documented below in the visit note. 

## 2014-04-05 ENCOUNTER — Encounter: Payer: Self-pay | Admitting: Family Medicine

## 2014-04-05 NOTE — Telephone Encounter (Signed)
Of course abstinence is the best way to avoid contracting it. Having him wear condoms helps but you can still come in contact with the virus on your skin outside the vagina such as the pubic area or the thighs. Oral sex poses the same risks for getting it around your face and mouth. The best thing would be for him to take a daily preventive medication such as Valtrex which reduces the amount of viral shedding from the skin. He should talk to his doctor about this.

## 2014-04-12 ENCOUNTER — Other Ambulatory Visit (INDEPENDENT_AMBULATORY_CARE_PROVIDER_SITE_OTHER): Payer: BC Managed Care – PPO

## 2014-04-12 DIAGNOSIS — Z0000 Encounter for general adult medical examination without abnormal findings: Secondary | ICD-10-CM

## 2014-04-12 LAB — BASIC METABOLIC PANEL
BUN: 12 mg/dL (ref 6–23)
CO2: 26 mEq/L (ref 19–32)
Calcium: 9.5 mg/dL (ref 8.4–10.5)
Chloride: 101 mEq/L (ref 96–112)
Creatinine, Ser: 0.76 mg/dL (ref 0.40–1.20)
GFR: 87.67 mL/min (ref >60.00)
Glucose, Bld: 100 mg/dL — ABNORMAL HIGH (ref 70–99)
Potassium: 4.5 mEq/L (ref 3.5–5.1)
Sodium: 136 mEq/L (ref 135–145)

## 2014-04-12 LAB — CBC WITH DIFFERENTIAL/PLATELET
Basophils Absolute: 0 10*3/uL (ref 0.0–0.1)
Basophils Relative: 0.3 % (ref 0.0–3.0)
Eosinophils Absolute: 0.4 10*3/uL (ref 0.0–0.7)
Eosinophils Relative: 2.5 % (ref 0.0–5.0)
HCT: 44.3 % (ref 36.0–46.0)
Hemoglobin: 15.4 g/dL — ABNORMAL HIGH (ref 12.0–15.0)
Lymphocytes Relative: 28.2 % (ref 12.0–46.0)
Lymphs Abs: 4 10*3/uL (ref 0.7–4.0)
MCHC: 34.7 g/dL (ref 30.0–36.0)
MCV: 88.9 fl (ref 78.0–100.0)
Monocytes Absolute: 1.1 10*3/uL — ABNORMAL HIGH (ref 0.1–1.0)
Monocytes Relative: 7.5 % (ref 3.0–12.0)
Neutro Abs: 8.8 10*3/uL — ABNORMAL HIGH (ref 1.4–7.7)
Neutrophils Relative %: 61.5 % (ref 43.0–77.0)
Platelets: 300 10*3/uL (ref 150.0–400.0)
RBC: 4.99 Mil/uL (ref 3.87–5.11)
RDW: 14.4 % (ref 11.5–15.5)
WBC: 14.3 10*3/uL — ABNORMAL HIGH (ref 4.0–10.5)

## 2014-04-12 LAB — POCT URINALYSIS DIPSTICK
Bilirubin, UA: NEGATIVE
Glucose, UA: NEGATIVE
Ketones, UA: NEGATIVE
Leukocytes, UA: NEGATIVE
Nitrite, UA: NEGATIVE
Spec Grav, UA: 1.01
Urobilinogen, UA: 0.2
pH, UA: 5.5

## 2014-04-12 LAB — LIPID PANEL
Cholesterol: 159 mg/dL (ref 0–200)
HDL: 46 mg/dL (ref >39.00)
LDL Cholesterol: 82 mg/dL (ref 0–99)
NonHDL: 113
Total CHOL/HDL Ratio: 3
Triglycerides: 154 mg/dL — ABNORMAL HIGH (ref 0.0–149.0)
VLDL: 30.8 mg/dL (ref 0.0–40.0)

## 2014-04-12 LAB — HEPATIC FUNCTION PANEL
ALT: 39 U/L — ABNORMAL HIGH (ref 0–35)
AST: 34 U/L (ref 0–37)
Albumin: 4 g/dL (ref 3.5–5.2)
Alkaline Phosphatase: 98 U/L (ref 39–117)
Bilirubin, Direct: 0.2 mg/dL (ref 0.0–0.3)
Total Bilirubin: 0.6 mg/dL (ref 0.2–1.2)
Total Protein: 7.6 g/dL (ref 6.0–8.3)

## 2014-04-12 LAB — TSH: TSH: 5.35 u[IU]/mL — ABNORMAL HIGH (ref 0.35–4.50)

## 2014-04-18 ENCOUNTER — Encounter: Payer: Self-pay | Admitting: Family Medicine

## 2014-04-18 MED ORDER — LEVOTHYROXINE SODIUM 75 MCG PO TABS: 75.0000 ug | ORAL_TABLET | Freq: Every day | ORAL | Status: DC

## 2014-04-18 NOTE — Addendum Note (Signed)
Addended by: Aggie Hacker A on: 04/18/2014 02:59 PM   Modules accepted: Orders

## 2014-04-19 ENCOUNTER — Ambulatory Visit (INDEPENDENT_AMBULATORY_CARE_PROVIDER_SITE_OTHER): Payer: BC Managed Care – PPO | Admitting: Family Medicine

## 2014-04-19 ENCOUNTER — Encounter: Payer: Self-pay | Admitting: Family Medicine

## 2014-04-19 VITALS — BP 137/92 | HR 104 | Temp 98.6°F | Ht 60.0 in | Wt 214.0 lb

## 2014-04-19 DIAGNOSIS — E038 Other specified hypothyroidism: Secondary | ICD-10-CM

## 2014-04-19 DIAGNOSIS — E039 Hypothyroidism, unspecified: Secondary | ICD-10-CM | POA: Insufficient documentation

## 2014-04-19 DIAGNOSIS — Z0000 Encounter for general adult medical examination without abnormal findings: Secondary | ICD-10-CM

## 2014-04-19 MED ORDER — ATORVASTATIN CALCIUM 40 MG PO TABS: 40.0000 mg | ORAL_TABLET | Freq: Every day | ORAL | Status: DC

## 2014-04-19 NOTE — Progress Notes (Signed)
Pre visit review using our clinic review tool, if applicable. No additional management support is needed unless otherwise documented below in the visit note. 

## 2014-04-19 NOTE — Progress Notes (Signed)
   Subjective:    Patient ID: Zoe [name], female    DOB: Feb 05, 1971, 44 y.o.   MRN: [id number]  HPI 44 yr old female for a cpx. She feels well in general. He recent labs revealed hypothyroidism and she was started on Synthroid.    Review of Systems  Constitutional: Negative.   HENT: Negative.   Eyes: Negative.   Respiratory: Negative.   Cardiovascular: Negative.   Gastrointestinal: Negative.   Genitourinary: Negative for dysuria, urgency, frequency, hematuria, flank pain, decreased urine volume, enuresis, difficulty urinating, pelvic pain and dyspareunia.  Musculoskeletal: Negative.   Skin: Negative.   Neurological: Negative.   Psychiatric/Behavioral: Negative.        Objective:   Physical Exam  Constitutional: She is oriented to person, place, and time. She appears well-developed and well-nourished. No distress.  HENT:  Head: Normocephalic and atraumatic.  Right Ear: External ear normal.  Left Ear: External ear normal.  Nose: Nose normal.  Mouth/Throat: Oropharynx is clear and moist. No oropharyngeal exudate.  Eyes: Conjunctivae and EOM are normal. Pupils are equal, round, and reactive to light. No scleral icterus.  Neck: Normal range of motion. Neck supple. No JVD present. No thyromegaly present.  Cardiovascular: Normal rate, regular rhythm, normal heart sounds and intact distal pulses.  Exam reveals no gallop and no friction rub.   No murmur heard. Pulmonary/Chest: Effort normal and breath sounds normal. No respiratory distress. She has no wheezes. She has no rales. She exhibits no tenderness.  Abdominal: Soft. Bowel sounds are normal. She exhibits no distension and no mass. There is no tenderness. There is no rebound and no guarding.  Musculoskeletal: Normal range of motion. She exhibits no edema or tenderness.  Lymphadenopathy:    She has no cervical adenopathy.  Neurological: She is alert and oriented to person, place, and time. She has normal reflexes. No cranial  nerve deficit. She exhibits normal muscle tone. Coordination normal.  Skin: Skin is warm and dry. No rash noted. No erythema.  Psychiatric: She has a normal mood and affect. Her behavior is normal. Judgment and thought content normal.          Assessment & Plan:  Well exam. Exercise and try to lose some weight. Recheck a TSH in 90 days

## 2014-04-23 ENCOUNTER — Other Ambulatory Visit: Payer: Self-pay | Admitting: Family Medicine

## 2014-04-26 ENCOUNTER — Encounter: Payer: Self-pay | Admitting: Family Medicine

## 2014-04-28 NOTE — Telephone Encounter (Signed)
Usually within about 4 weeks

## 2014-06-22 ENCOUNTER — Encounter: Payer: Self-pay | Admitting: Family Medicine

## 2014-06-23 NOTE — Telephone Encounter (Signed)
She really needs to take it in the morning. Do the best you can with remembering

## 2014-07-03 ENCOUNTER — Encounter: Payer: Self-pay | Admitting: Neurology

## 2014-07-03 ENCOUNTER — Ambulatory Visit: Payer: BC Managed Care – PPO | Admitting: Nurse Practitioner

## 2014-07-03 ENCOUNTER — Ambulatory Visit (INDEPENDENT_AMBULATORY_CARE_PROVIDER_SITE_OTHER): Payer: BC Managed Care – PPO | Admitting: Neurology

## 2014-07-03 VITALS — BP 153/97 | HR 111 | Ht 60.0 in | Wt 217.0 lb

## 2014-07-03 DIAGNOSIS — G43009 Migraine without aura, not intractable, without status migrainosus: Secondary | ICD-10-CM

## 2014-07-03 MED ORDER — NORTRIPTYLINE HCL 25 MG PO CAPS: ORAL_CAPSULE | ORAL | Status: DC

## 2014-07-03 NOTE — Progress Notes (Signed)
[facility]  PATIENT: Zoe [name] DOB: 14-Jan-1971   REASON FOR VISIT: for follow of headache  HISTORY OF PRESENT ILLNESS: Ms. Strohman, follow up for migraine. She was last seen in May 2015 by Hoyle Sauer.   She has history of migraine headaches, for a while, she has frequent bilateral frontal, and retrorbital area headaches, pressure, 4-5 times a week She denies signficiant light sensitivity, headache last for 3-4 hours, not relieved by tylenol, ibprofen.  Her headache has much decreased with nortriptyline, taking excess her migraine as needed for moderate to severe headaches, rarely used Maxalt. She only has 5 or 6 major headaches in one year  She has anemia due to heavy menses, she also has insomnia, mildly overweight, denies significant snoring, daytime sleepiness.   REVIEW OF SYSTEMS: Full 14 system review of systems performed and notable only for those listed, all others are neg: As above    ALLERGIES: Allergies  Allergen Reactions  . Penicillins     REACTION: hives    HOME MEDICATIONS: Outpatient Prescriptions Prior to Visit  Medication Sig Dispense Refill  . ALPRAZolam (XANAX) 1 MG tablet TAKE 1 TABLET BY MOUTH TWICE A DAY AS NEEDED FOR ANXIETY 60 tablet 5  . atorvastatin (LIPITOR) 40 MG tablet TAKE 1 TABLET BY MOUTH AT BEDTIME 30 tablet 9  . BEPREVE 1.5 % SOLN Place 1 drop into both eyes at bedtime.    Marland Kitchen EPINEPHrine (EPI-PEN) 0.3 mg/0.3 mL DEVI Inject 0.3 mg into the muscle once.    Marland Kitchen HYDROcodone-acetaminophen (NORCO/VICODIN) 5-325 MG per tablet Take 1-2 tablets by mouth every 4 (four) hours as needed. 10 tablet 0  . levocetirizine (XYZAL) 5 MG tablet Take 5 mg by mouth every evening.    Marland Kitchen levothyroxine (SYNTHROID, LEVOTHROID) 75 MCG tablet Take 1 tablet (75 mcg total) by mouth daily. 90 tablet 3  . montelukast (SINGULAIR) 10 MG tablet Take 10 mg by mouth at bedtime.    . nortriptyline (PAMELOR) 10 MG capsule Take 4 capsules (40 mg total) by mouth at  bedtime. 120 capsule 11  . rizatriptan (MAXALT-MLT) 10 MG disintegrating tablet Take 10 mg by mouth. At onset of acute migraine. May repeat in 2 hours if needed    . azithromycin (ZITHROMAX) 250 MG tablet As directed (Patient not taking: Reported on 04/19/2014) 6 tablet 0  . doxycycline (VIBRA-TABS) 100 MG tablet Take 1 tablet (100 mg total) by mouth 2 (two) times daily. (Patient not taking: Reported on 03/27/2014) 20 tablet 0  . HYDROcodone-homatropine (HYDROMET) 5-1.5 MG/5ML syrup Take 5 mLs by mouth every 4 (four) hours as needed. (Patient not taking: Reported on 03/27/2014) 240 mL 0   No facility-administered medications prior to visit.    PAST MEDICAL HISTORY: Past Medical History  Diagnosis Date  . Allergy   . Anemia   . Chickenpox   . Osteoarthritis   . Menorrhagia   . Benign hematuria     worked up by Dr. Kathie Rhodes  . Horseshoe kidney     PAST SURGICAL HISTORY: Past Surgical History  Procedure Laterality Date  . Cesarean section    . Tubal ligation    . Tympanostomy      FAMILY HISTORY: Family History  Problem Relation Age of Onset  . Arthritis    . Coronary artery disease    . Depression    . Diabetes    . Hyperlipidemia    . Sudden death    . Colon polyps Brother     SOCIAL  HISTORY: History   Social History  . Marital Status: Single    Spouse Name: N/A  . Number of Children: 3  . Years of Education: 12+   Occupational History  .  [place] History Main Topics  . Smoking status: Never Smoker   . Smokeless tobacco: Never Used  . Alcohol Use: No  . Drug Use: No  . Sexual Activity: Not on file   Other Topics Concern  . Not on file   Social History Narrative   Patient is married.    Patient has 3 children.    Patient lives with parents.    Patient is employed with [employer] and [employer]    Patient has some college.      PHYSICAL EXAM  Filed Vitals:   07/03/14 0950  BP: 153/97  Pulse: 111  Height: 5' (1.524 m)  Weight: 217  lb (98.431 kg)   Body mass index is 42.38 kg/(m^2).  PHYSICAL EXAMNIATION:  Gen: NAD, conversant, well nourised, obese, well groomed                     Cardiovascular: Regular rate rhythm, no peripheral edema, warm, nontender. Eyes: Conjunctivae clear without exudates or hemorrhage Neck: Supple, no carotid bruise. Pulmonary: Clear to auscultation bilaterally   NEUROLOGICAL EXAM:  MENTAL STATUS: Speech:    Speech is normal; fluent and spontaneous with normal comprehension.  Cognition:    The patient is oriented to person, place, and time;     recent and remote memory intact;     language fluent;     normal attention, concentration,     fund of knowledge.  CRANIAL NERVES: CN II: Visual fields are full to confrontation. Fundoscopic exam is normal with sharp discs and no vascular changes. Venous pulsations are present bilaterally. Pupils are 4 mm and briskly reactive to light. Visual acuity is 20/20 bilaterally. CN III, IV, VI: extraocular movement are normal. No ptosis. CN V: Facial sensation is intact to pinprick in all 3 divisions bilaterally. Corneal responses are intact.  CN VII: Face is symmetric with normal eye closure and smile. CN VIII: Hearing is normal to rubbing fingers CN IX, X: Palate elevates symmetrically. Phonation is normal. CN XI: Head turning and shoulder shrug are intact CN XII: Tongue is midline with normal movements and no atrophy. Narrow oropharyngeal space  MOTOR: There is no pronator drift of out-stretched arms. Muscle bulk and tone are normal. Muscle strength is normal.   Shoulder abduction Shoulder external rotation Elbow flexion Elbow extension Wrist flexion Wrist extension Finger abduction Hip flexion Knee flexion Knee extension Ankle dorsi flexion Ankle plantar flexion  R _0 L _1 REFLEXES: Reflexes are 2+ and symmetric at the biceps, triceps, knees, and ankles. Plantar responses are  flexor.  SENSORY: Light touch, pinprick, position sense, and vibration sense are intact in fingers and toes.  COORDINATION: Rapid alternating movements and fine finger movements are intact. There is no dysmetria on finger-to-nose and heel-knee-shin. There are no abnormal or extraneous movements.   GAIT/STANCE: Posture is normal. Gait is steady with normal steps, base, arm swing, and turning. Heel and toe walking are normal. Tandem gait is normal.  Romberg is absent.    DIAGNOSTIC DATA (LABS, IMAGING, TESTING) - I reviewed patient records, labs, notes, testing and imaging myself where available.  Lab Results  Component Value Date   WBC 14.3* 04/12/2014   HGB 15.4* 04/12/2014   HCT 44.3 04/12/2014   MCV 88.9 04/12/2014   PLT 300.0 04/12/2014      Component Value Date/Time   NA 136 04/12/2014 0919   K 4.5 04/12/2014 0919   CL 101 04/12/2014 0919   CO2 26 04/12/2014 0919   GLUCOSE 100* 04/12/2014 0919   BUN 12 04/12/2014 0919   CREATININE 0.76 04/12/2014 0919   CALCIUM 9.5 04/12/2014 0919   PROT 7.6 04/12/2014 0919   ALBUMIN 4.0 04/12/2014 0919   AST 34 04/12/2014 0919   ALT 39* 04/12/2014 0919   ALKPHOS 98 04/12/2014 0919   BILITOT 0.6 04/12/2014 0919   GFRNONAA >60 01/11/2010 0852   GFRAA  01/11/2010 0852    >60        The eGFR has been calculated using the MDRD equation. This calculation has not been validated in all clinical situations. eGFR's persistently <60 mL/min signify possible Chronic Kidney Disease.   Lab Results  Component Value Date   CHOL 159 04/12/2014   HDL 46.00 04/12/2014   LDLCALC 82 04/12/2014   LDLDIRECT 137.1 07/31/2009   TRIG 154.0* 04/12/2014   CHOLHDL 3 04/12/2014    Lab Results  Component Value Date   TSH 5.35* 04/12/2014    ASSESSMENT AND PLAN  44 y.o. year old female  has a past medical history of migraine headaches here to follow up.  Continue Nortriptyline, refill prescription 25 mg 2 tablets every night She has  insomnia, narrow oropharyngeal, prone to develop obstructive sleep apnea, but she denies significant daytime sleepiness and fatigue at this point,  She will continue follow-up with her primary care,    Marcial Pacas, M.D. Ph.D.  [facility] [place], [place] [address] Phone: [phone] Fax:      [phone]

## 2014-07-18 ENCOUNTER — Other Ambulatory Visit (INDEPENDENT_AMBULATORY_CARE_PROVIDER_SITE_OTHER): Payer: BC Managed Care – PPO

## 2014-07-18 DIAGNOSIS — E038 Other specified hypothyroidism: Secondary | ICD-10-CM

## 2014-07-18 LAB — TSH: TSH: 2.48 u[IU]/mL (ref 0.35–4.50)

## 2014-07-20 ENCOUNTER — Encounter: Payer: Self-pay | Admitting: Family Medicine

## 2014-07-21 NOTE — Telephone Encounter (Signed)
Leave the Synthroid dose where it is

## 2014-08-30 ENCOUNTER — Other Ambulatory Visit: Payer: Self-pay | Admitting: Obstetrics and Gynecology

## 2014-08-31 LAB — CYTOLOGY - PAP

## 2014-09-03 ENCOUNTER — Other Ambulatory Visit: Payer: Self-pay | Admitting: Neurology

## 2014-10-08 ENCOUNTER — Encounter (HOSPITAL_BASED_OUTPATIENT_CLINIC_OR_DEPARTMENT_OTHER): Payer: Self-pay | Admitting: Emergency Medicine

## 2014-10-08 ENCOUNTER — Emergency Department (HOSPITAL_BASED_OUTPATIENT_CLINIC_OR_DEPARTMENT_OTHER)
Admission: EM | Admit: 2014-10-08 | Discharge: 2014-10-08 | Disposition: A | Discharge status: Home / Self Care | Payer: BC Managed Care – PPO | Attending: Emergency Medicine | Admitting: Emergency Medicine

## 2014-10-08 ENCOUNTER — Emergency Department (HOSPITAL_BASED_OUTPATIENT_CLINIC_OR_DEPARTMENT_OTHER): Payer: BC Managed Care – PPO

## 2014-10-08 DIAGNOSIS — Z862 Personal history of diseases of the blood and blood-forming organs and certain disorders involving the immune mechanism: Secondary | ICD-10-CM | POA: Diagnosis not present

## 2014-10-08 DIAGNOSIS — Q631 Lobulated, fused and horseshoe kidney: Secondary | ICD-10-CM | POA: Insufficient documentation

## 2014-10-08 DIAGNOSIS — M1990 Unspecified osteoarthritis, unspecified site: Secondary | ICD-10-CM | POA: Insufficient documentation

## 2014-10-08 DIAGNOSIS — Z880 Allergy status to penicillin: Secondary | ICD-10-CM | POA: Diagnosis not present

## 2014-10-08 DIAGNOSIS — M779 Enthesopathy, unspecified: Secondary | ICD-10-CM | POA: Diagnosis not present

## 2014-10-08 DIAGNOSIS — Z8619 Personal history of other infectious and parasitic diseases: Secondary | ICD-10-CM | POA: Insufficient documentation

## 2014-10-08 DIAGNOSIS — Z87448 Personal history of other diseases of urinary system: Secondary | ICD-10-CM | POA: Diagnosis not present

## 2014-10-08 DIAGNOSIS — Z79899 Other long term (current) drug therapy: Secondary | ICD-10-CM | POA: Diagnosis not present

## 2014-10-08 DIAGNOSIS — E079 Disorder of thyroid, unspecified: Secondary | ICD-10-CM | POA: Insufficient documentation

## 2014-10-08 DIAGNOSIS — Z8742 Personal history of other diseases of the female genital tract: Secondary | ICD-10-CM | POA: Insufficient documentation

## 2014-10-08 DIAGNOSIS — M79672 Pain in left foot: Secondary | ICD-10-CM | POA: Diagnosis present

## 2014-10-08 HISTORY — DX: Disorder of thyroid, unspecified: E07.9

## 2014-10-08 NOTE — ED Notes (Signed)
Patient states that she hurt her foot about a month ago, unsure how - pain in getting worse

## 2014-10-08 NOTE — Discharge Instructions (Signed)
Take 600 mg ibuprofen 4 times a day for 10-14 days  Tendinitis and Tenosynovitis  Tendinitis is inflammation of the tendon. Tenosynovitis is inflammation of the lining around the tendon (tendon sheath). These painful conditions often occur at once. Tendons attach muscle to bone. To move a limb, force from the muscle moves through the tendon, to the bone. These conditions often cause increased pain when moving. Tendinitis may be caused by a small or partial tear in the tendon.  SYMPTOMS   Pain, tenderness, redness, bruising, or swelling at the injury.  Loss of normal joint movement.  Pain that gets worse with use of the muscle and joint attached to the tendon.  Weakness in the tendon, caused by calcium build up that may occur with tendinitis.  Commonly affected tendons:  Achilles tendon (calf of leg).  Rotator cuff (shoulder joint).  Patellar tendon (kneecap to shin).  Peroneal tendon (ankle).  Posterior tibial tendon (inner ankle).  Biceps tendon (in front of shoulder). CAUSES   Sudden strain on a flexed muscle, muscle overuse, sudden increase or change in activity, vigorous activity.  Result of a direct hit (less common).  Poor muscle action (biomechanics). RISK INCREASES WITH:  Injury (trauma).  Too much exercise.  Sudden change in athletic activity.  Incorrect exercise form or technique.  Poor strength and flexibility.  Not warming-up properly before activity.  Returning to activity before healing is complete. PREVENTION   Warm-up and stretch properly before activity.  Maintain physical fitness:  Joint flexibility.  Muscle strength and endurance.  Fitness that increases heart rate.  Learn and use proper exercise techniques.  Use rehabilitation exercises to strengthen weak muscles and tendons.  Ice the tendon after activity, to reduce recurring inflammation.  Wear proper fitting protective equipment for specific tendons, when  indicated. PROGNOSIS  When treated properly, can be cured in 6 to 8 weeks. Recovery may take longer, depending on degree of injury.  RELATED COMPLICATIONS   Re-injury or recurring symptoms.  Permanent weakness or joint stiffness, if injury is severe and recovery is not completed.  Delayed healing, if sports are started before healing is complete.  Tearing apart (rupture) of the inflamed tendon. Tendinitis means the tendon is injured and must recover. TREATMENT  Treatment first involves ice, medicine, and rest from aggravating activities. This reduces pain and inflammation. Modifying your activity may be considered to prevent recurring injury. A brace, elastic bandage wrap, splint, cast, or sling may be prescribed to protect the joint for a short period. After that period, strengthening and stretching exercise may help to regain strength and full range of motion. If the condition persists, despite non-surgical treatment, surgery may be recommended to remove the inflamed tendon lining. Corticosteroid injections may be given to reduce inflammation. However, these injections may weaken the tendon and increase your risk for tendon rupture. MEDICATION   If pain medicine is needed, nonsteroidal anti-inflammatory medicines (aspirin and ibuprofen), or other minor pain relievers (acetaminophen), are often recommended.  Do not take pain medicine for 7 days before surgery.  Prescription pain relievers are usually prescribed only after surgery. Use only as directed and only as much as you need.  Ointments applied to the skin may be helpful.  Corticosteroid injections may be given to reduce inflammation. However, this may increase your risk of a tendon rupture. HEAT AND COLD  Cold treatment (icing) relieves pain and reduces inflammation. Cold treatment should be applied for 10 to 15 minutes every 2 to 3 hours, and immediately after activity that  aggravates your symptoms. Use ice packs or an ice  massage.  Heat treatment may be used before performing stretching and strengthening activities prescribed by your caregiver, physical therapist, or athletic trainer. Use a heat pack or a warm water soak. SEEK MEDICAL CARE IF:   Symptoms get worse or do not improve, despite treatment.  Pain becomes too much to tolerate.  You develop numbness or tingling.  Toes become cold, or toenails become blue, gray, or dark colored.  New, unexplained symptoms develop. (Drugs used in treatment may produce side effects.) Document Released: 02/10/2005 Document Revised: 05/05/2011 Document Reviewed: 05/25/2008 [facility] Patient Information 2015 [place], [place]. This information is not intended to replace advice given to you by your health care provider. Make sure you discuss any questions you have with your health care provider.

## 2014-10-08 NOTE — ED Provider Notes (Signed)
CSN: [id number]     Arrival date & time 10/08/14  2116 History   First MD Initiated Contact with Patient 10/08/14 2146     Chief Complaint  Patient presents with  . Foot Pain      HPI Patient presents with chronic on-again off-again left foot pain on the dorsal lateral aspect of the foot.  Pain is worse when she dorsiflexes her foot.  Note definite injury although she did plan a softball league over the summer.  No redness or swelling.  Pain usually relieved with ibuprofen. Past Medical History  Diagnosis Date  . Allergy   . Anemia   . Chickenpox   . Osteoarthritis   . Menorrhagia   . Benign hematuria     worked up by Dr. Kathie Rhodes  . Horseshoe kidney   . Thyroid disease    Past Surgical History  Procedure Laterality Date  . Cesarean section    . Tubal ligation    . Tympanostomy     Family History  Problem Relation Age of Onset  . Arthritis    . Coronary artery disease    . Depression    . Diabetes    . Hyperlipidemia    . Sudden death    . Colon polyps Brother    Social History  Substance Use Topics  . Smoking status: Never Smoker   . Smokeless tobacco: Never Used  . Alcohol Use: No   OB History    No data available     Review of Systems  All other systems reviewed and are negative  Allergies  Penicillins  Home Medications   Prior to Admission medications   Medication Sig Start Date End Date Taking? Authorizing Provider  ALPRAZolam Duanne Moron) 1 MG tablet TAKE 1 TABLET BY MOUTH TWICE A DAY AS NEEDED FOR ANXIETY    Laurey Morale, MD  atorvastatin (LIPITOR) 40 MG tablet TAKE 1 TABLET BY MOUTH AT BEDTIME 04/24/14   Laurey Morale, MD  BEPREVE 1.5 % SOLN Place 1 drop into both eyes at bedtime. 04/08/13   Historical Provider, MD  EPINEPHrine (EPI-PEN) 0.3 mg/0.3 mL DEVI Inject 0.3 mg into the muscle once.    Historical Provider, MD  HYDROcodone-acetaminophen (NORCO/VICODIN) 5-325 MG per tablet Take 1-2 tablets by mouth every 4 (four) hours as needed. 04/09/13    Carman Ching, PA-C  levocetirizine (XYZAL) 5 MG tablet Take 5 mg by mouth every evening.    Historical Provider, MD  levothyroxine (SYNTHROID, LEVOTHROID) 75 MCG tablet Take 1 tablet (75 mcg total) by mouth daily. 04/18/14   Laurey Morale, MD  montelukast (SINGULAIR) 10 MG tablet Take 10 mg by mouth at bedtime.    Historical Provider, MD  nortriptyline (PAMELOR) 25 MG capsule 2 tabs po qhs 07/03/14   Marcial Pacas, MD  rizatriptan (MAXALT-MLT) 10 MG disintegrating tablet Take 10 mg by mouth. At onset of acute migraine. May repeat in 2 hours if needed    Historical Provider, MD   BP 155/106 mmHg  Pulse 103  Temp(Src) 98.3 F (36.8 C) (Oral)  Resp 18  SpO2 100% Physical Exam  Constitutional: She is oriented to person, place, and time. She appears well-developed and well-nourished. No distress.  HENT:  Head: Normocephalic and atraumatic.  Eyes: Pupils are equal, round, and reactive to light.  Neck: Normal range of motion.  Cardiovascular: Normal rate and intact distal pulses.   Pulmonary/Chest: No respiratory distress.  Abdominal: Normal appearance. She exhibits no distension.  Musculoskeletal:  Feet:  Pain when patient dorsiflexes foot.  Neurological: She is alert and oriented to person, place, and time. No cranial nerve deficit.  Skin: Skin is warm and dry. No rash noted.  Psychiatric: She has a normal mood and affect. Her behavior is normal.  Nursing note and vitals reviewed.   ED Course  Procedures (including critical care time) Labs Review Labs Reviewed - No data to display  Imaging Review Dg Foot Complete Left  10/08/2014   CLINICAL DATA:  Left foot pain for 1 month with no known injury. Pain is worse today.  EXAM: LEFT FOOT - COMPLETE 3+ VIEW  COMPARISON:  None currently available (09/27/1999)  FINDINGS: There is no evidence of fracture or dislocation. No focal bone lesion.  There is mild spurring and narrowing of the first MTP joint.  Plantar and dorsal calcaneal spurs  without erosion.  IMPRESSION: 1. No acute finding. 2. Heel spurs.   Electronically Signed   By: Monte Fantasia M.D.   On: 10/08/2014 22:52   I, Talina Pleitez L, personally reviewed and evaluated these images and lab results as part of my medical decision-making.   EKG Interpretation None      MDM   Final diagnoses:  Tendonitis        Leonard Schwartz, MD 10/08/14 2300

## 2014-10-08 NOTE — ED Notes (Signed)
Patient transported to X-ray 

## 2014-10-09 ENCOUNTER — Other Ambulatory Visit: Payer: Self-pay | Admitting: Obstetrics and Gynecology

## 2014-11-17 ENCOUNTER — Other Ambulatory Visit: Payer: Self-pay

## 2014-11-17 DIAGNOSIS — Z1231 Encounter for screening mammogram for malignant neoplasm of breast: Secondary | ICD-10-CM

## 2014-12-08 ENCOUNTER — Ambulatory Visit
Admission: RE | Admit: 2014-12-08 | Discharge: 2014-12-08 | Disposition: A | Discharge status: Home / Self Care | Payer: BC Managed Care – PPO | Source: Ambulatory Visit

## 2014-12-08 DIAGNOSIS — Z1231 Encounter for screening mammogram for malignant neoplasm of breast: Secondary | ICD-10-CM

## 2015-04-03 ENCOUNTER — Encounter: Payer: Self-pay | Admitting: Family Medicine

## 2015-04-03 ENCOUNTER — Ambulatory Visit (INDEPENDENT_AMBULATORY_CARE_PROVIDER_SITE_OTHER): Payer: BC Managed Care – PPO | Admitting: Family Medicine

## 2015-04-03 VITALS — BP 140/80 | HR 98 | Temp 98.1°F | Ht 60.0 in | Wt 224.0 lb

## 2015-04-03 DIAGNOSIS — R600 Localized edema: Secondary | ICD-10-CM | POA: Diagnosis not present

## 2015-04-03 NOTE — Progress Notes (Signed)
Pre visit review using our clinic review tool, if applicable. No additional management support is needed unless otherwise documented below in the visit note. 

## 2015-04-03 NOTE — Progress Notes (Signed)
   Subjective:    Patient ID: Zoe [name], female    DOB: Jul 31, 1970, 45 y.o.   MRN: [id number]  HPI Here for the onset of swelling over the right cheek this morning. She feels fine in general. No pain in the mouth, no new medications recently. No pain on chewing. No sinus congestion. No rashes.    Review of Systems  Constitutional: Negative.   HENT: Positive for facial swelling. Negative for congestion, ear discharge, mouth sores, nosebleeds, postnasal drip, sinus pressure, sore throat and trouble swallowing.   Eyes: Negative.   Respiratory: Negative.   Skin: Negative for rash.       Objective:   Physical Exam  Constitutional: She is oriented to person, place, and time. She appears well-developed and well-nourished. No distress.  HENT:  Right Ear: External ear normal.  Left Ear: External ear normal.  Nose: Nose normal.  Mouth/Throat: Oropharynx is clear and moist. No oropharyngeal exudate.  Mild edema over right cheek. No rashes, no warmth or erythema. No tenderness  Eyes: Conjunctivae and EOM are normal. Pupils are equal, round, and reactive to light.  Neck: Neck supple. No thyromegaly present.  Pulmonary/Chest: Effort normal and breath sounds normal.  Lymphadenopathy:    She has no cervical adenopathy.  Neurological: She is alert and oriented to person, place, and time.  Skin: No rash noted. No erythema.          Assessment & Plan:  Cheek swelling of uncertain etiology, possibly allergic. Use cold packs and take Benadryl prn. Recheck prn

## 2015-04-04 ENCOUNTER — Ambulatory Visit (INDEPENDENT_AMBULATORY_CARE_PROVIDER_SITE_OTHER): Payer: BC Managed Care – PPO | Admitting: Family Medicine

## 2015-04-04 ENCOUNTER — Encounter: Payer: Self-pay | Admitting: Family Medicine

## 2015-04-04 VITALS — BP 150/89 | HR 114 | Temp 98.7°F | Ht 60.0 in | Wt 224.0 lb

## 2015-04-04 DIAGNOSIS — K047 Periapical abscess without sinus: Secondary | ICD-10-CM

## 2015-04-04 MED ORDER — CLINDAMYCIN HCL 300 MG PO CAPS: 300.0000 mg | ORAL_CAPSULE | Freq: Four times a day (QID) | ORAL | Status: DC

## 2015-04-04 NOTE — Progress Notes (Signed)
Pre visit review using our clinic review tool, if applicable. No additional management support is needed unless otherwise documented below in the visit note. 

## 2015-04-04 NOTE — Progress Notes (Signed)
   Subjective:    Patient ID: Zoe [name], female    DOB: 11/08/1970, 45 y.o.   MRN: [id number]  HPI Here for worsening swelling in the right cheek area and now with pain. She was seen yesterday with swelling but she had no pain or tenderness on exam. This morning she has a fair amount of pain and she thinks she has an abscessed tooth. No fever. She has not seen a dentist for years.   Review of Systems  Constitutional: Negative.   HENT: Positive for dental problem and facial swelling.   Eyes: Negative.   Respiratory: Negative.        Objective:   Physical Exam  Constitutional: She appears well-developed and well-nourished.  HENT:  Right Ear: External ear normal.  Left Ear: External ear normal.  Nose: Nose normal.  The right cheek is swollen but not red or warm. She tender over the right zygoma. On examination of the mouth she is very tender just superior to the right upper canine  Eyes: Conjunctivae are normal.  Neck: No thyromegaly present.  Lymphadenopathy:    She has no cervical adenopathy.          Assessment & Plan:  Dental abscess. Given Clindamycin 300 mg QID. She can take Ibuprofen 800 mg QID for pain. She will get in to see her dentist ASAP

## 2015-04-19 ENCOUNTER — Other Ambulatory Visit (INDEPENDENT_AMBULATORY_CARE_PROVIDER_SITE_OTHER): Payer: BC Managed Care – PPO

## 2015-04-19 DIAGNOSIS — Z0000 Encounter for general adult medical examination without abnormal findings: Secondary | ICD-10-CM

## 2015-04-19 LAB — CBC WITH DIFFERENTIAL/PLATELET
Basophils Absolute: 0 10*3/uL (ref 0.0–0.1)
Basophils Relative: 0.3 % (ref 0.0–3.0)
Eosinophils Absolute: 0.3 10*3/uL (ref 0.0–0.7)
Eosinophils Relative: 2.9 % (ref 0.0–5.0)
HCT: 42.3 % (ref 36.0–46.0)
Hemoglobin: 14.5 g/dL (ref 12.0–15.0)
Lymphocytes Relative: 26.6 % (ref 12.0–46.0)
Lymphs Abs: 3.2 10*3/uL (ref 0.7–4.0)
MCHC: 34.2 g/dL (ref 30.0–36.0)
MCV: 89.3 fl (ref 78.0–100.0)
Monocytes Absolute: 0.8 10*3/uL (ref 0.1–1.0)
Monocytes Relative: 6.7 % (ref 3.0–12.0)
Neutro Abs: 7.5 10*3/uL (ref 1.4–7.7)
Neutrophils Relative %: 63.5 % (ref 43.0–77.0)
Platelets: 310 10*3/uL (ref 150.0–400.0)
RBC: 4.73 Mil/uL (ref 3.87–5.11)
RDW: 14.5 % (ref 11.5–15.5)
WBC: 11.9 10*3/uL — ABNORMAL HIGH (ref 4.0–10.5)

## 2015-04-19 LAB — POC URINALSYSI DIPSTICK (AUTOMATED)
Bilirubin, UA: NEGATIVE
Glucose, UA: NEGATIVE
Ketones, UA: NEGATIVE
Leukocytes, UA: NEGATIVE
Nitrite, UA: NEGATIVE
Spec Grav, UA: 1.015
Urobilinogen, UA: 0.2
pH, UA: 6

## 2015-04-19 LAB — LIPID PANEL
Cholesterol: 161 mg/dL (ref 0–200)
HDL: 45.5 mg/dL (ref >39.00)
LDL Cholesterol: 94 mg/dL (ref 0–99)
NonHDL: 115.64
Total CHOL/HDL Ratio: 4
Triglycerides: 108 mg/dL (ref 0.0–149.0)
VLDL: 21.6 mg/dL (ref 0.0–40.0)

## 2015-04-19 LAB — HEPATIC FUNCTION PANEL
ALT: 71 U/L — ABNORMAL HIGH (ref 0–35)
AST: 77 U/L — ABNORMAL HIGH (ref 0–37)
Albumin: 4 g/dL (ref 3.5–5.2)
Alkaline Phosphatase: 87 U/L (ref 39–117)
Bilirubin, Direct: 0.1 mg/dL (ref 0.0–0.3)
Total Bilirubin: 0.7 mg/dL (ref 0.2–1.2)
Total Protein: 7.8 g/dL (ref 6.0–8.3)

## 2015-04-19 LAB — BASIC METABOLIC PANEL
BUN: 8 mg/dL (ref 6–23)
CO2: 25 mEq/L (ref 19–32)
Calcium: 9.2 mg/dL (ref 8.4–10.5)
Chloride: 103 mEq/L (ref 96–112)
Creatinine, Ser: 0.78 mg/dL (ref 0.40–1.20)
GFR: 84.69 mL/min (ref >60.00)
Glucose, Bld: 133 mg/dL — ABNORMAL HIGH (ref 70–99)
Potassium: 4 mEq/L (ref 3.5–5.1)
Sodium: 137 mEq/L (ref 135–145)

## 2015-04-19 LAB — TSH: TSH: 3.89 u[IU]/mL (ref 0.35–4.50)

## 2015-04-23 ENCOUNTER — Ambulatory Visit (INDEPENDENT_AMBULATORY_CARE_PROVIDER_SITE_OTHER): Payer: BC Managed Care – PPO | Admitting: Family Medicine

## 2015-04-23 ENCOUNTER — Encounter: Payer: Self-pay | Admitting: Family Medicine

## 2015-04-23 VITALS — BP 150/90 | HR 104 | Temp 98.7°F | Ht 60.0 in | Wt 223.0 lb

## 2015-04-23 DIAGNOSIS — E119 Type 2 diabetes mellitus without complications: Secondary | ICD-10-CM | POA: Insufficient documentation

## 2015-04-23 DIAGNOSIS — I10 Essential (primary) hypertension: Secondary | ICD-10-CM | POA: Insufficient documentation

## 2015-04-23 DIAGNOSIS — Z0000 Encounter for general adult medical examination without abnormal findings: Secondary | ICD-10-CM

## 2015-04-23 DIAGNOSIS — Z0001 Encounter for general adult medical examination with abnormal findings: Secondary | ICD-10-CM

## 2015-04-23 LAB — POCT GLYCOSYLATED HEMOGLOBIN (HGB A1C): Hemoglobin A1C: 6.1

## 2015-04-23 MED ORDER — LISINOPRIL 10 MG PO TABS: 10.0000 mg | ORAL_TABLET | Freq: Every day | ORAL | Status: DC

## 2015-04-23 MED ORDER — LEVOTHYROXINE SODIUM 75 MCG PO TABS: 75.0000 ug | ORAL_TABLET | Freq: Every day | ORAL | Status: DC

## 2015-04-23 MED ORDER — NORTRIPTYLINE HCL 50 MG PO CAPS: 50.0000 mg | ORAL_CAPSULE | Freq: Every day | ORAL | Status: DC

## 2015-04-23 MED ORDER — ATORVASTATIN CALCIUM 40 MG PO TABS: 40.0000 mg | ORAL_TABLET | Freq: Every day | ORAL | Status: DC

## 2015-04-23 NOTE — Progress Notes (Signed)
   Subjective:    Patient ID: Zoe [name], female    DOB: 14-Dec-1970, 45 y.o.   MRN: [id number]  HPI 45 yr old female for a cpx. She feels well. She was here recently for an abscessed tooth and her dentist pulled it. Her BP has been running around 150/90 for 6 months or so. Her fasting glucose is 133. She is familiar with diabetes since both her diabetic parents live with her.    Review of Systems  Constitutional: Negative.   HENT: Negative.   Eyes: Negative.   Respiratory: Negative.   Cardiovascular: Negative.   Gastrointestinal: Negative.   Genitourinary: Negative for dysuria, urgency, frequency, hematuria, flank pain, decreased urine volume, enuresis, difficulty urinating, pelvic pain and dyspareunia.  Musculoskeletal: Negative.   Skin: Negative.   Neurological: Negative.   Psychiatric/Behavioral: Negative.        Objective:   Physical Exam  Constitutional: She is oriented to person, place, and time. She appears well-developed and well-nourished. No distress.  HENT:  Head: Normocephalic and atraumatic.  Right Ear: External ear normal.  Left Ear: External ear normal.  Nose: Nose normal.  Mouth/Throat: Oropharynx is clear and moist. No oropharyngeal exudate.  Eyes: Conjunctivae and EOM are normal. Pupils are equal, round, and reactive to light. Right eye exhibits no discharge. Left eye exhibits no discharge. No scleral icterus.  Neck: Normal range of motion. Neck supple. No JVD present. No thyromegaly present.  Cardiovascular: Normal rate, regular rhythm, normal heart sounds and intact distal pulses.  Exam reveals no gallop and no friction rub.   No murmur heard. Pulmonary/Chest: Effort normal and breath sounds normal. No stridor. No respiratory distress. She has no wheezes. She has no rales. She exhibits no tenderness.  Abdominal: Soft. Normal appearance and bowel sounds are normal. She exhibits no distension, no abdominal bruit, no ascites and no mass. There is no  hepatosplenomegaly. There is no tenderness. There is no rigidity, no rebound and no guarding. No hernia.  Genitourinary: Rectum normal. No breast swelling, tenderness, discharge or bleeding. Cervix exhibits no motion tenderness, no discharge and no friability. Right adnexum displays no mass, no tenderness and no fullness. Left adnexum displays no mass, no tenderness and no fullness. No erythema, tenderness or bleeding in the vagina.  Musculoskeletal: Normal range of motion. She exhibits no edema or tenderness.  Lymphadenopathy:    She has no cervical adenopathy.  Neurological: She is alert and oriented to person, place, and time. She has normal reflexes. No cranial nerve deficit. She exhibits normal muscle tone. Coordination normal.  Skin: Skin is warm and dry. No rash noted. She is not diaphoretic. No erythema. No pallor.  Psychiatric: She has a normal mood and affect. Her behavior is normal. Judgment and thought content normal.          Assessment & Plan:  Well exam. We discussed diet and exercise. She has newly diagnosed diabetes and we will refer her to Nutrition to discuss this.start on Lisinopril 10 mg daily for the HTN. Recheck in 90 days .

## 2015-04-23 NOTE — Progress Notes (Signed)
Pre visit review using our clinic review tool, if applicable. No additional management support is needed unless otherwise documented below in the visit note. 

## 2015-05-07 ENCOUNTER — Encounter: Payer: Self-pay | Admitting: Family Medicine

## 2015-05-07 ENCOUNTER — Ambulatory Visit (INDEPENDENT_AMBULATORY_CARE_PROVIDER_SITE_OTHER): Payer: BC Managed Care – PPO | Admitting: Family Medicine

## 2015-05-07 VITALS — BP 129/80 | HR 111 | Temp 98.7°F | Ht 60.0 in | Wt 220.0 lb

## 2015-05-07 DIAGNOSIS — M545 Low back pain, unspecified: Secondary | ICD-10-CM

## 2015-05-07 MED ORDER — CYCLOBENZAPRINE HCL 10 MG PO TABS: 10.0000 mg | ORAL_TABLET | Freq: Three times a day (TID) | ORAL | Status: DC | PRN

## 2015-05-07 MED ORDER — PREDNISONE 10 MG PO TABS: ORAL_TABLET | ORAL | Status: DC

## 2015-05-07 MED ORDER — HYDROCODONE-ACETAMINOPHEN 10-325 MG PO TABS
1.0000 | ORAL_TABLET | Freq: Three times a day (TID) | ORAL | Status: DC | PRN
Start: 2015-05-07 — End: 2015-05-25

## 2015-05-07 NOTE — Progress Notes (Signed)
Pre visit review using our clinic review tool, if applicable. No additional management support is needed unless otherwise documented below in the visit note. 

## 2015-05-07 NOTE — Progress Notes (Signed)
   Subjective:    Patient ID: Zoe [name], female    DOB: 1970/03/30, 45 y.o.   MRN: [id number]  HPI Here for 2 weeks of low back pain. No recent trauma. She had some back pain years ago but she has done well with this until lately. Using heat and 800 mg of Ibuprofen with little relief. No pain or numbness in the legs.    Review of Systems  Constitutional: Negative.   Musculoskeletal: Positive for back pain.       Objective:   Physical Exam  Constitutional:  In pain  Musculoskeletal:  Tender over the lower back, more on the left than the right. Tender over the left sciatic notch. Full ROM but SLR is positive on the left           Assessment & Plan:  Low back pain, probably due to a herniated disc. Use heat. Add Flexeril, Norco, and a Prednisone taper.

## 2015-05-11 ENCOUNTER — Encounter: Payer: Self-pay | Admitting: Family Medicine

## 2015-05-25 ENCOUNTER — Ambulatory Visit (INDEPENDENT_AMBULATORY_CARE_PROVIDER_SITE_OTHER): Payer: BC Managed Care – PPO | Admitting: Family Medicine

## 2015-05-25 ENCOUNTER — Encounter: Payer: Self-pay | Admitting: Family Medicine

## 2015-05-25 VITALS — BP 122/74 | HR 107 | Temp 98.9°F | Ht 60.0 in | Wt 218.0 lb

## 2015-05-25 DIAGNOSIS — J019 Acute sinusitis, unspecified: Secondary | ICD-10-CM

## 2015-05-25 MED ORDER — HYDROCODONE-HOMATROPINE 5-1.5 MG/5ML PO SYRP: 5.0000 mL | ORAL_SOLUTION | ORAL | Status: DC | PRN

## 2015-05-25 MED ORDER — CEFUROXIME AXETIL 500 MG PO TABS: 500.0000 mg | ORAL_TABLET | Freq: Two times a day (BID) | ORAL | Status: DC

## 2015-05-25 NOTE — Progress Notes (Signed)
Pre visit review using our clinic review tool, if applicable. No additional management support is needed unless otherwise documented below in the visit note. 

## 2015-05-25 NOTE — Progress Notes (Signed)
   Subjective:    Patient ID: Zoe [name], female    DOB: [birth year], 45 y.o.   MRN: [id number]  HPI Here for 5 days of sinus pressure, PND, ST, and a dry cough.    Review of Systems  Constitutional: Negative.   HENT: Positive for congestion, postnasal drip, sinus pressure and sore throat.   Eyes: Negative.   Respiratory: Positive for cough.        Objective:   Physical Exam  Constitutional: She appears well-developed and well-nourished.  HENT:  Right Ear: External ear normal.  Left Ear: External ear normal.  Nose: Nose normal.  Mouth/Throat: Oropharynx is clear and moist.  Eyes: Conjunctivae are normal.  Neck: No thyromegaly present.  Pulmonary/Chest: Effort normal and breath sounds normal.  Lymphadenopathy:    She has no cervical adenopathy.          Assessment & Plan:  Sinusitis, treat with Ceftin.  Laurey Morale, MD

## 2015-06-01 ENCOUNTER — Other Ambulatory Visit: Payer: Self-pay | Admitting: Family Medicine

## 2015-07-17 ENCOUNTER — Other Ambulatory Visit (INDEPENDENT_AMBULATORY_CARE_PROVIDER_SITE_OTHER): Payer: BC Managed Care – PPO

## 2015-07-17 ENCOUNTER — Encounter: Payer: Self-pay | Admitting: Family Medicine

## 2015-07-17 DIAGNOSIS — E119 Type 2 diabetes mellitus without complications: Secondary | ICD-10-CM | POA: Diagnosis not present

## 2015-07-17 LAB — HEMOGLOBIN A1C: Hgb A1c MFr Bld: 5.9 % (ref 4.6–6.5)

## 2015-08-07 ENCOUNTER — Encounter (HOSPITAL_BASED_OUTPATIENT_CLINIC_OR_DEPARTMENT_OTHER): Payer: Self-pay

## 2015-08-07 ENCOUNTER — Emergency Department (HOSPITAL_BASED_OUTPATIENT_CLINIC_OR_DEPARTMENT_OTHER): Payer: BC Managed Care – PPO

## 2015-08-07 ENCOUNTER — Emergency Department (HOSPITAL_BASED_OUTPATIENT_CLINIC_OR_DEPARTMENT_OTHER)
Admission: EM | Admit: 2015-08-07 | Discharge: 2015-08-08 | Disposition: A | Discharge status: Home / Self Care | Payer: BC Managed Care – PPO | Attending: Emergency Medicine | Admitting: Emergency Medicine

## 2015-08-07 DIAGNOSIS — M79672 Pain in left foot: Secondary | ICD-10-CM | POA: Diagnosis present

## 2015-08-07 NOTE — ED Notes (Signed)
Left foot pain x 1 week-denies injury-NAD-limping gait

## 2015-08-08 MED ORDER — NAPROXEN 250 MG PO TABS
500.0000 mg | ORAL_TABLET | Freq: Once | ORAL | Status: AC
  Administered 2015-08-08: 500 mg via ORAL
  Filled 2015-08-08: qty 2

## 2015-08-08 MED ORDER — NAPROXEN 500 MG PO TABS
ORAL_TABLET | ORAL | Status: DC
Start: 2015-08-08 — End: 2016-04-11

## 2015-08-08 NOTE — ED Provider Notes (Signed)
CSN: [id number]     Arrival date & time 08/07/15  2218 History   First MD Initiated Contact with Patient 08/08/15 0020     Chief Complaint  Patient presents with  . Foot Pain     (Consider location/radiation/quality/duration/timing/severity/associated sxs/prior Treatment) HPI  This is a 45 year old female with a one-week history of pain in her left foot. The pain is located dorsally and is worse with walking or weightbearing. It is moderate to severe at its worst. She has been walking more than usual. She has been taking ibuprofen 800 milligrams 3 times a day which relieves the pain but when the ibuprofen wears off the pain returns. She denies trauma. She has a history of tendinitis in the other foot in the past.  Past Medical History  Diagnosis Date  . Allergy   . Anemia   . Chickenpox   . Osteoarthritis   . Menorrhagia   . Benign hematuria     worked up by Dr. Kathie Rhodes  . Horseshoe kidney   . Thyroid disease    Past Surgical History  Procedure Laterality Date  . Cesarean section    . Tubal ligation    . Tympanostomy     Family History  Problem Relation Age of Onset  . Arthritis    . Coronary artery disease    . Depression    . Diabetes    . Hyperlipidemia    . Sudden death    . Colon polyps Brother    Social History  Substance Use Topics  . Smoking status: Never Smoker   . Smokeless tobacco: Never Used  . Alcohol Use: No   OB History    No data available     Review of Systems  All other systems reviewed and are negative.   Allergies  Penicillins  Home Medications   Prior to Admission medications   Medication Sig Start Date End Date Taking? Authorizing Provider  atorvastatin (LIPITOR) 40 MG tablet Take 1 tablet (40 mg total) by mouth at bedtime. 04/23/15   Laurey Morale, MD  cefUROXime (CEFTIN) 500 MG tablet Take 1 tablet (500 mg total) by mouth 2 (two) times daily with a meal. 05/25/15   Laurey Morale, MD  EPINEPHrine (EPI-PEN) 0.3 mg/0.3 mL DEVI  Inject 0.3 mg into the muscle once. Reported on 05/25/2015    Historical Provider, MD  HYDROcodone-homatropine (HYDROMET) 5-1.5 MG/5ML syrup Take 5 mLs by mouth every 4 (four) hours as needed. 05/25/15   Laurey Morale, MD  levocetirizine (XYZAL) 5 MG tablet Take 5 mg by mouth every evening.    Historical Provider, MD  levothyroxine (SYNTHROID, LEVOTHROID) 75 MCG tablet Take 1 tablet (75 mcg total) by mouth daily. 04/23/15   Laurey Morale, MD  levothyroxine (SYNTHROID, LEVOTHROID) 75 MCG tablet TAKE 1 TABLET BY MOUTH EVERY DAY 06/01/15   Laurey Morale, MD  lisinopril (PRINIVIL,ZESTRIL) 10 MG tablet Take 1 tablet (10 mg total) by mouth daily. 04/23/15   Laurey Morale, MD  montelukast (SINGULAIR) 10 MG tablet Take 10 mg by mouth at bedtime.    Historical Provider, MD  nortriptyline (PAMELOR) 50 MG capsule Take 1 capsule (50 mg total) by mouth at bedtime. 04/23/15   Laurey Morale, MD   BP 143/93 mmHg  Pulse 75  Temp(Src) 98.9 F (37.2 C) (Oral)  Resp 16  Ht 5' (1.524 m)  Wt 206 lb (93.441 kg)  BMI 40.23 kg/m2  SpO2 99%   Physical Exam  General:  Well-developed, well-nourished female in no acute distress; appearance consistent with age of record HENT: normocephalic; atraumatic Eyes: pupils equal, round and reactive to light; extraocular muscles intact Neck: supple Heart: regular rate and rhythm Lungs: clear to auscultation bilaterally Abdomen: soft; nondistended; nontender; bowel sounds present Extremities: No deformity; full range of motion; pulses normal; left foot tenderness when pressure placed on the sole, no dorsal tenderness Neurologic: Awake, alert and oriented; motor function intact in all extremities and symmetric; no facial droop Skin: Warm and dry Psychiatric: Normal mood and affect    ED Course  Procedures (including critical care time)   MDM  Nursing notes and vitals signs, including pulse oximetry, reviewed.  Summary of this visit's results, reviewed by myself:  Imaging  Studies: Dg Foot Complete Left  08/07/2015  CLINICAL DATA:  Left foot pain for 1 week. No injury. Pain on top of foot. EXAM: LEFT FOOT - COMPLETE 3+ VIEW COMPARISON:  10/08/2014 FINDINGS: There is no evidence of fracture or dislocation. There is no evidence of arthropathy or other focal bone abnormality. Soft tissues are unremarkable. Plantar and Achilles calcaneal spurs. IMPRESSION: No acute bony abnormalities. Electronically Signed   By: Lucienne Capers M.D.   On: 08/07/2015 23:03   We will switch the patient from ibuprofen to naproxen and refer to podiatry.   Shanon Rosser, MD 08/08/15 [phone]

## 2015-08-15 ENCOUNTER — Ambulatory Visit (HOSPITAL_BASED_OUTPATIENT_CLINIC_OR_DEPARTMENT_OTHER)
Admission: RE | Admit: 2015-08-15 | Discharge: 2015-08-15 | Disposition: A | Discharge status: Home / Self Care | Payer: BC Managed Care – PPO | Source: Ambulatory Visit | Attending: Podiatry | Admitting: Podiatry

## 2015-08-15 ENCOUNTER — Ambulatory Visit (INDEPENDENT_AMBULATORY_CARE_PROVIDER_SITE_OTHER): Payer: BC Managed Care – PPO | Admitting: Podiatry

## 2015-08-15 ENCOUNTER — Encounter: Payer: Self-pay | Admitting: Podiatry

## 2015-08-15 DIAGNOSIS — M19072 Primary osteoarthritis, left ankle and foot: Secondary | ICD-10-CM | POA: Diagnosis not present

## 2015-08-15 DIAGNOSIS — M8430XA Stress fracture, unspecified site, initial encounter for fracture: Secondary | ICD-10-CM | POA: Diagnosis not present

## 2015-08-15 DIAGNOSIS — M722 Plantar fascial fibromatosis: Secondary | ICD-10-CM

## 2015-08-15 DIAGNOSIS — R52 Pain, unspecified: Secondary | ICD-10-CM | POA: Diagnosis not present

## 2015-08-15 NOTE — Progress Notes (Signed)
   Subjective:    Patient ID: Zoe [name], female    DOB: 1970-06-26, 45 y.o.   MRN: [id number]  HPI  45 year old female presents the office today for concerns of left foot pain which is been ongoing for about 2 weeks. She states that before the pain started she started to increase her walking she started an exercise routine and she was up to walking 2 miles a day. She was walking for about 1 week before the pain started to the occur at first the pain was only with walking but however the pain is now on a daily basis with any activity. She went to the emergency room for this and she was on anti-inflammatory. No other complaints this time.  Review of Systems  All other systems reviewed and are negative.      Objective:   Physical Exam General: AAO x3, NAD  Dermatological: Skin is warm, dry and supple bilateral. Nails x 10 are well manicured; remaining integument appears unremarkable at this time. There are no open sores, no preulcerative lesions, no rash or signs of infection present.  Vascular: Dorsalis Pedis artery and Posterior Tibial artery pedal pulses are 2/4 bilateral with immedate capillary fill time. Pedal hair growth present.There is no pain with calf compression, swelling, warmth, erythema.   Neruologic: Grossly intact via light touch bilateral. Vibratory intact via tuning fork bilateral. Protective threshold with Semmes Wienstein monofilament intact to all pedal sites bilateral.   Musculoskeletal: There is tenderness on the second metatarsal left foot there is also tenderness along the midfoot on the left side. There is mild pain with vibratory sensation overlying these areas. There does appear to be a faint amount of edema on the dorsal aspect of the left foot without any erythema or increase in warmth. There is no other areas of tenderness. Ankle, subtalar, MPJ range of motion intact without any restrictions. MMT 5/5. Decrease in medial arch height. Subject with there is some  tenderness on the medial band of the plantar fascia within the arch of the foot however she is not expansion the pain in this time.  Gait: Unassisted, Nonantalgic.      Assessment & Plan:  45 year old female with concern of left foot stress fracture, osteoarthritis -Treatment options discussed including all alternatives, risks, and complications -Etiology of symptoms were discussed -X-rays were obtained and reviewed with the patient. There is no definitive evidence of acute fracture. There is also arthritis present. -At this time given her clinical history the amount of pain that she is having concern for stress fracture. Place a surgical shoe today. Continue the anti-inflammatories. Ice as needed. -I believe that long-term she would benefit from an orthotic to help support her foot. She is recasted for an insert. -Follow-up in 3 weeks or sooner if needed.  Celesta Gentile, DPM

## 2015-08-17 ENCOUNTER — Ambulatory Visit: Payer: BC Managed Care – PPO | Admitting: *Deleted

## 2015-08-17 DIAGNOSIS — M722 Plantar fascial fibromatosis: Secondary | ICD-10-CM

## 2015-08-17 NOTE — Progress Notes (Signed)
Patient ID: Tenny Craw, female   DOB: 01/28/1971, 45 y.o.   MRN: [id number] Patient presents at Dr. Leigh Aurora request from the [place] office to be scanned for custom orthotics.  Patient will follow up in 3 weeks for pick up at the [place] office.

## 2015-08-20 ENCOUNTER — Ambulatory Visit: Payer: BC Managed Care – PPO | Admitting: Podiatry

## 2015-09-03 ENCOUNTER — Ambulatory Visit: Payer: BC Managed Care – PPO | Admitting: Podiatry

## 2015-09-05 ENCOUNTER — Other Ambulatory Visit: Payer: Self-pay | Admitting: Obstetrics and Gynecology

## 2015-09-05 ENCOUNTER — Ambulatory Visit (INDEPENDENT_AMBULATORY_CARE_PROVIDER_SITE_OTHER): Payer: BC Managed Care – PPO | Admitting: Podiatry

## 2015-09-05 ENCOUNTER — Ambulatory Visit (HOSPITAL_BASED_OUTPATIENT_CLINIC_OR_DEPARTMENT_OTHER)
Admission: RE | Admit: 2015-09-05 | Discharge: 2015-09-05 | Disposition: A | Discharge status: Home / Self Care | Payer: BC Managed Care – PPO | Source: Ambulatory Visit | Attending: Podiatry | Admitting: Podiatry

## 2015-09-05 DIAGNOSIS — M722 Plantar fascial fibromatosis: Secondary | ICD-10-CM | POA: Diagnosis not present

## 2015-09-05 DIAGNOSIS — M19072 Primary osteoarthritis, left ankle and foot: Secondary | ICD-10-CM

## 2015-09-05 DIAGNOSIS — M8430XD Stress fracture, unspecified site, subsequent encounter for fracture with routine healing: Secondary | ICD-10-CM | POA: Insufficient documentation

## 2015-09-05 NOTE — Patient Instructions (Signed)

## 2015-09-06 LAB — CYTOLOGY - PAP

## 2015-09-07 ENCOUNTER — Telehealth: Payer: Self-pay | Admitting: *Deleted

## 2015-09-07 NOTE — Telephone Encounter (Signed)
Dr. Jacqualyn Posey states x-ray is negative for fracture.  Informed pt of Dr. Leigh Aurora review.

## 2015-09-09 NOTE — Progress Notes (Signed)
Patient ID: Tenny Craw, female   DOB: June 19, 1970, 45 y.o.   MRN: [id number]  Subjective: 45 year old female presents the office orthotics in for follow-up evaluation of left foot pain. She has been wearing a surgical shoe at all times and she feels that her pain has improved and she is not having any pain this time. She had any swelling or redness. No new concerns today no acute changes otherwise. Denies any systemic complaints such as fevers, chills, nausea, vomiting.   Objective: AAO x3, NAD DP/PT pulses palpable bilaterally, CRT less than 3 seconds Tenderness is no tenderness palpation of the second metatarsal and there are no other areas of pinpoint bony tenderness. There is no tenderness on the midfoot at this time. This, no tenderness in the course or the insertion the plantar fascia. There is no overlying edema, erythema, increase in warmth. There is no pain vibratory sensation. MMT 5/5, ROM WNL. No edema, erythema, increase in warmth to bilateral lower extremities.  No open lesions or pre-ulcerative lesions.  No pain with calf compression, swelling, warmth, erythema  Assessment: Resolving left foot pain.  Plan: -All treatment options discussed with the patient including all alternatives, risks, complications.  -Repeated excision obtained. Evidence of acute fracture. -At this time she inserted transition to her orthotics. Orthotics were dispensed today oral and written break in instructions were discussed. -Monitor for any recurrence of foot pain. -I will see her back in 4 weeks if needed or sooner if any issues are to arise. In the meantime encouraged her to call any questions, concerns or any change in symptoms.   Celesta Gentile, DPM

## 2015-10-17 ENCOUNTER — Ambulatory Visit: Payer: BC Managed Care – PPO | Admitting: Podiatry

## 2015-11-20 ENCOUNTER — Telehealth: Payer: Self-pay | Admitting: Family Medicine

## 2015-11-20 NOTE — Telephone Encounter (Signed)
Call in Xanax 1 mg bid prn anxiety, #60 with 5 rf

## 2015-11-20 NOTE — Telephone Encounter (Signed)
Pt would like new rx xanax  call into [facility]. Per pt it was prescribed 2 yrs ago

## 2015-11-22 MED ORDER — ALPRAZOLAM 1 MG PO TABS
1.0000 mg | ORAL_TABLET | Freq: Two times a day (BID) | ORAL | 5 refills | Status: DC | PRN
Start: 2015-11-22 — End: 2016-09-30

## 2015-11-22 NOTE — Telephone Encounter (Signed)
I called in script to [facility] and spoke with pt. 

## 2015-11-22 NOTE — Addendum Note (Signed)
Addended by: Aggie Hacker A on: 11/22/2015 12:19 PM   Modules accepted: Orders

## 2015-12-17 ENCOUNTER — Other Ambulatory Visit: Payer: Self-pay | Admitting: Family Medicine

## 2015-12-17 DIAGNOSIS — Z1231 Encounter for screening mammogram for malignant neoplasm of breast: Secondary | ICD-10-CM

## 2016-01-04 ENCOUNTER — Ambulatory Visit
Admission: RE | Admit: 2016-01-04 | Discharge: 2016-01-04 | Disposition: A | Discharge status: Home / Self Care | Payer: BC Managed Care – PPO | Source: Ambulatory Visit | Attending: Family Medicine | Admitting: Family Medicine

## 2016-01-04 DIAGNOSIS — Z1231 Encounter for screening mammogram for malignant neoplasm of breast: Secondary | ICD-10-CM

## 2016-01-08 ENCOUNTER — Other Ambulatory Visit: Payer: Self-pay | Admitting: Family Medicine

## 2016-01-08 DIAGNOSIS — N6489 Other specified disorders of breast: Secondary | ICD-10-CM

## 2016-01-11 ENCOUNTER — Other Ambulatory Visit: Payer: Self-pay | Admitting: Family Medicine

## 2016-01-11 ENCOUNTER — Other Ambulatory Visit: Payer: Self-pay

## 2016-01-11 DIAGNOSIS — N6489 Other specified disorders of breast: Secondary | ICD-10-CM

## 2016-01-14 ENCOUNTER — Ambulatory Visit
Admission: RE | Admit: 2016-01-14 | Discharge: 2016-01-14 | Disposition: A | Discharge status: Home / Self Care | Payer: BC Managed Care – PPO | Source: Ambulatory Visit | Attending: Family Medicine | Admitting: Family Medicine

## 2016-01-14 DIAGNOSIS — N6489 Other specified disorders of breast: Secondary | ICD-10-CM

## 2016-03-04 ENCOUNTER — Other Ambulatory Visit: Payer: Self-pay | Admitting: Obstetrics and Gynecology

## 2016-03-04 ENCOUNTER — Other Ambulatory Visit: Payer: Self-pay | Admitting: Family Medicine

## 2016-03-06 LAB — CYTOLOGY - PAP

## 2016-04-04 ENCOUNTER — Other Ambulatory Visit: Payer: Self-pay | Admitting: Family Medicine

## 2016-04-11 ENCOUNTER — Ambulatory Visit (INDEPENDENT_AMBULATORY_CARE_PROVIDER_SITE_OTHER): Payer: BC Managed Care – PPO | Admitting: Family Medicine

## 2016-04-11 ENCOUNTER — Encounter: Payer: Self-pay | Admitting: Family Medicine

## 2016-04-11 VITALS — BP 105/83 | HR 107 | Temp 98.7°F | Ht 60.0 in | Wt 216.0 lb

## 2016-04-11 DIAGNOSIS — J0100 Acute maxillary sinusitis, unspecified: Secondary | ICD-10-CM | POA: Diagnosis not present

## 2016-04-11 MED ORDER — LEVOFLOXACIN 500 MG PO TABS: 500.0000 mg | ORAL_TABLET | Freq: Every day | ORAL | 0 refills | Status: AC

## 2016-04-11 NOTE — Progress Notes (Signed)
   Subjective:    Patient ID: Zoe [name], female    DOB: [birth year], 46 y.o.   MRN: [id number]  HPI Here for one week of sinus pressure, ear pressure, PND, and blowing green mucus from the nose. No cough or fever. On Mucinex.    Review of Systems  Constitutional: Negative.   HENT: Positive for congestion, ear pain, postnasal drip, sinus pain and sinus pressure. Negative for sore throat.   Eyes: Negative.   Respiratory: Negative.        Objective:   Physical Exam  Constitutional: She appears well-developed and well-nourished.  HENT:  Right Ear: External ear normal.  Left Ear: External ear normal.  Nose: Nose normal.  Mouth/Throat: Oropharynx is clear and moist.  Eyes: Conjunctivae are normal.  Neck: Neck supple. No thyromegaly present.  Pulmonary/Chest: Effort normal and breath sounds normal.  Lymphadenopathy:    She has no cervical adenopathy.          Assessment & Plan:  Sinusitis, treat with Levaquin.  Alysia Penna, MD

## 2016-04-11 NOTE — Progress Notes (Signed)
Pre visit review using our clinic review tool, if applicable. No additional management support is needed unless otherwise documented below in the visit note. 

## 2016-04-14 ENCOUNTER — Encounter: Payer: Self-pay | Admitting: Family Medicine

## 2016-04-16 ENCOUNTER — Other Ambulatory Visit (INDEPENDENT_AMBULATORY_CARE_PROVIDER_SITE_OTHER): Payer: BC Managed Care – PPO

## 2016-04-16 DIAGNOSIS — Z0000 Encounter for general adult medical examination without abnormal findings: Secondary | ICD-10-CM | POA: Diagnosis not present

## 2016-04-16 LAB — CBC WITH DIFFERENTIAL/PLATELET
Basophils Absolute: 0.1 10*3/uL (ref 0.0–0.1)
Basophils Relative: 0.6 % (ref 0.0–3.0)
Eosinophils Absolute: 0.5 10*3/uL (ref 0.0–0.7)
Eosinophils Relative: 3.8 % (ref 0.0–5.0)
HCT: 39.3 % (ref 36.0–46.0)
Hemoglobin: 13.4 g/dL (ref 12.0–15.0)
Lymphocytes Relative: 25.1 % (ref 12.0–46.0)
Lymphs Abs: 3.4 10*3/uL (ref 0.7–4.0)
MCHC: 34 g/dL (ref 30.0–36.0)
MCV: 90.5 fl (ref 78.0–100.0)
Monocytes Absolute: 1.1 10*3/uL — ABNORMAL HIGH (ref 0.1–1.0)
Monocytes Relative: 8 % (ref 3.0–12.0)
Neutro Abs: 8.6 10*3/uL — ABNORMAL HIGH (ref 1.4–7.7)
Neutrophils Relative %: 62.5 % (ref 43.0–77.0)
Platelets: 291 10*3/uL (ref 150.0–400.0)
RBC: 4.35 Mil/uL (ref 3.87–5.11)
RDW: 14.9 % (ref 11.5–15.5)
WBC: 13.7 10*3/uL — ABNORMAL HIGH (ref 4.0–10.5)

## 2016-04-16 LAB — MICROALBUMIN / CREATININE URINE RATIO
Creatinine,U: 75.1 mg/dL
Microalb Creat Ratio: 24.8 mg/g (ref 0.0–30.0)
Microalb, Ur: 18.6 mg/dL — ABNORMAL HIGH (ref 0.0–1.9)

## 2016-04-16 LAB — POC URINALSYSI DIPSTICK (AUTOMATED)
Bilirubin, UA: NEGATIVE
Glucose, UA: NEGATIVE
Ketones, UA: NEGATIVE
Leukocytes, UA: NEGATIVE
Nitrite, UA: NEGATIVE
Spec Grav, UA: 1.02
Urobilinogen, UA: 0.2
pH, UA: 5

## 2016-04-16 LAB — LIPID PANEL
Cholesterol: 130 mg/dL (ref 0–200)
HDL: 41 mg/dL (ref >39.00)
LDL Cholesterol: 62 mg/dL (ref 0–99)
NonHDL: 88.95
Total CHOL/HDL Ratio: 3
Triglycerides: 134 mg/dL (ref 0.0–149.0)
VLDL: 26.8 mg/dL (ref 0.0–40.0)

## 2016-04-16 LAB — BASIC METABOLIC PANEL
BUN: 11 mg/dL (ref 6–23)
CO2: 28 mEq/L (ref 19–32)
Calcium: 8.9 mg/dL (ref 8.4–10.5)
Chloride: 104 mEq/L (ref 96–112)
Creatinine, Ser: 0.71 mg/dL (ref 0.40–1.20)
GFR: 93.99 mL/min (ref >60.00)
Glucose, Bld: 106 mg/dL — ABNORMAL HIGH (ref 70–99)
Potassium: 4.5 mEq/L (ref 3.5–5.1)
Sodium: 138 mEq/L (ref 135–145)

## 2016-04-16 LAB — HEMOGLOBIN A1C: Hgb A1c MFr Bld: 6.1 % (ref 4.6–6.5)

## 2016-04-16 LAB — HEPATIC FUNCTION PANEL
ALT: 31 U/L (ref 0–35)
AST: 24 U/L (ref 0–37)
Albumin: 3.9 g/dL (ref 3.5–5.2)
Alkaline Phosphatase: 89 U/L (ref 39–117)
Bilirubin, Direct: 0.1 mg/dL (ref 0.0–0.3)
Total Bilirubin: 0.4 mg/dL (ref 0.2–1.2)
Total Protein: 6.7 g/dL (ref 6.0–8.3)

## 2016-04-16 LAB — TSH: TSH: 3.57 u[IU]/mL (ref 0.35–4.50)

## 2016-04-18 NOTE — Telephone Encounter (Signed)
Try Delsym syrup

## 2016-04-19 ENCOUNTER — Other Ambulatory Visit: Payer: Self-pay | Admitting: Family Medicine

## 2016-04-23 ENCOUNTER — Ambulatory Visit (INDEPENDENT_AMBULATORY_CARE_PROVIDER_SITE_OTHER): Payer: BC Managed Care – PPO | Admitting: Family Medicine

## 2016-04-23 ENCOUNTER — Encounter: Payer: Self-pay | Admitting: Family Medicine

## 2016-04-23 VITALS — BP 114/73 | HR 80 | Temp 98.5°F | Ht 60.0 in | Wt 219.0 lb

## 2016-04-23 DIAGNOSIS — Z0000 Encounter for general adult medical examination without abnormal findings: Secondary | ICD-10-CM | POA: Diagnosis not present

## 2016-04-23 MED ORDER — ATORVASTATIN CALCIUM 40 MG PO TABS: 40.0000 mg | ORAL_TABLET | Freq: Every day | ORAL | 3 refills | Status: DC

## 2016-04-23 MED ORDER — LEVOTHYROXINE SODIUM 75 MCG PO TABS: 75.0000 ug | ORAL_TABLET | Freq: Every day | ORAL | 3 refills | Status: DC

## 2016-04-23 NOTE — Progress Notes (Signed)
   Subjective:    Patient ID: Zoe [name], female    DOB: 05-16-1970, 46 y.o.   MRN: [id number]  HPI 46 yr old female for a well exam. She feels great. She has gotten over her recent sinus infection.    Review of Systems  Constitutional: Negative.   HENT: Negative.   Eyes: Negative.   Respiratory: Negative.   Cardiovascular: Negative.   Gastrointestinal: Negative.   Genitourinary: Negative for decreased urine volume, difficulty urinating, dyspareunia, dysuria, enuresis, flank pain, frequency, hematuria, pelvic pain and urgency.  Musculoskeletal: Negative.   Skin: Negative.   Neurological: Negative.   Psychiatric/Behavioral: Negative.        Objective:   Physical Exam  Constitutional: She is oriented to person, place, and time. She appears well-developed and well-nourished. No distress.  HENT:  Head: Normocephalic and atraumatic.  Right Ear: External ear normal.  Left Ear: External ear normal.  Nose: Nose normal.  Mouth/Throat: Oropharynx is clear and moist. No oropharyngeal exudate.  Eyes: Conjunctivae and EOM are normal. Pupils are equal, round, and reactive to light. No scleral icterus.  Neck: Normal range of motion. Neck supple. No JVD present. No thyromegaly present.  Cardiovascular: Normal rate, regular rhythm, normal heart sounds and intact distal pulses.  Exam reveals no gallop and no friction rub.   No murmur heard. Pulmonary/Chest: Effort normal and breath sounds normal. No respiratory distress. She has no wheezes. She has no rales. She exhibits no tenderness.  Abdominal: Soft. Bowel sounds are normal. She exhibits no distension and no mass. There is no tenderness. There is no rebound and no guarding.  Musculoskeletal: Normal range of motion. She exhibits no edema or tenderness.  Lymphadenopathy:    She has no cervical adenopathy.  Neurological: She is alert and oriented to person, place, and time. She has normal reflexes. No cranial nerve deficit. She exhibits  normal muscle tone. Coordination normal.  Skin: Skin is warm and dry. No rash noted. No erythema.  Psychiatric: She has a normal mood and affect. Her behavior is normal. Judgment and thought content normal.          Assessment & Plan:  Well exam. We discussed diet and exercise. Alysia Penna, MD

## 2016-04-23 NOTE — Patient Instructions (Signed)
**  Coming March 12th**  [facility]!!!  Same Day Appointments for Acute Care: Sprains, Injuries, cuts, abrasions Colds, flu, sore throats, cough, upset stomachs Fever, ear pain Sinus and eye infections Animal/insect bites  3 Easy Ways to Schedule: Walk-In Scheduling Call in scheduling Mychart Sign-up: [email or web address]         

## 2016-04-23 NOTE — Progress Notes (Signed)
Pre visit review using our clinic review tool, if applicable. No additional management support is needed unless otherwise documented below in the visit note. 

## 2016-06-04 ENCOUNTER — Other Ambulatory Visit: Payer: Self-pay | Admitting: Family Medicine

## 2016-07-06 ENCOUNTER — Other Ambulatory Visit: Payer: Self-pay | Admitting: Family Medicine

## 2016-08-29 ENCOUNTER — Other Ambulatory Visit: Payer: Self-pay | Admitting: Family Medicine

## 2016-08-29 DIAGNOSIS — N6489 Other specified disorders of breast: Secondary | ICD-10-CM

## 2016-09-08 ENCOUNTER — Other Ambulatory Visit: Payer: BC Managed Care – PPO

## 2016-09-29 ENCOUNTER — Other Ambulatory Visit: Payer: Self-pay | Admitting: Family Medicine

## 2016-09-29 ENCOUNTER — Ambulatory Visit
Admission: RE | Admit: 2016-09-29 | Discharge: 2016-09-29 | Disposition: A | Discharge status: Home / Self Care | Payer: BC Managed Care – PPO | Source: Ambulatory Visit | Attending: Family Medicine | Admitting: Family Medicine

## 2016-09-29 DIAGNOSIS — N6489 Other specified disorders of breast: Secondary | ICD-10-CM

## 2016-09-29 DIAGNOSIS — N63 Unspecified lump in unspecified breast: Secondary | ICD-10-CM

## 2016-09-30 ENCOUNTER — Other Ambulatory Visit: Payer: Self-pay | Admitting: Family Medicine

## 2016-10-01 NOTE — Telephone Encounter (Signed)
Call in #60 with 5 rf 

## 2016-10-02 NOTE — Telephone Encounter (Signed)
Rx for Alprazolam 1 mg was phoned in to pt pharmacy #[phone] with 5 refills, left a message on the pt voicemail.

## 2016-11-13 ENCOUNTER — Encounter: Payer: Self-pay | Admitting: Family Medicine

## 2016-12-01 ENCOUNTER — Encounter (HOSPITAL_BASED_OUTPATIENT_CLINIC_OR_DEPARTMENT_OTHER): Payer: Self-pay | Admitting: Emergency Medicine

## 2016-12-01 ENCOUNTER — Emergency Department (HOSPITAL_BASED_OUTPATIENT_CLINIC_OR_DEPARTMENT_OTHER): Payer: BC Managed Care – PPO

## 2016-12-01 ENCOUNTER — Emergency Department (HOSPITAL_BASED_OUTPATIENT_CLINIC_OR_DEPARTMENT_OTHER)
Admission: EM | Admit: 2016-12-01 | Discharge: 2016-12-01 | Disposition: A | Discharge status: Home / Self Care | Payer: BC Managed Care – PPO | Attending: Emergency Medicine | Admitting: Emergency Medicine

## 2016-12-01 DIAGNOSIS — E039 Hypothyroidism, unspecified: Secondary | ICD-10-CM | POA: Diagnosis not present

## 2016-12-01 DIAGNOSIS — M25522 Pain in left elbow: Secondary | ICD-10-CM | POA: Diagnosis present

## 2016-12-01 DIAGNOSIS — Z79899 Other long term (current) drug therapy: Secondary | ICD-10-CM | POA: Insufficient documentation

## 2016-12-01 DIAGNOSIS — E079 Disorder of thyroid, unspecified: Secondary | ICD-10-CM | POA: Diagnosis not present

## 2016-12-01 DIAGNOSIS — I10 Essential (primary) hypertension: Secondary | ICD-10-CM | POA: Insufficient documentation

## 2016-12-01 DIAGNOSIS — E119 Type 2 diabetes mellitus without complications: Secondary | ICD-10-CM | POA: Insufficient documentation

## 2016-12-01 DIAGNOSIS — M7712 Lateral epicondylitis, left elbow: Secondary | ICD-10-CM | POA: Diagnosis not present

## 2016-12-01 MED ORDER — MELOXICAM 15 MG PO TABS: 15.0000 mg | ORAL_TABLET | Freq: Every day | ORAL | 0 refills | Status: DC

## 2016-12-01 NOTE — ED Provider Notes (Signed)
[facility] Provider Note   CSN: [id number] Arrival date & time: 12/01/16  1649     History   Chief Complaint Chief Complaint  Patient presents with  . Elbow Pain    HPI Zoe [name] is a 46 y.o. female.  HPI   46 year old female who presents with 2-3 weeks of gradually worsening left elbow pain. Pain located over the back of the elbow. No known inciting trauma, fall or injury. Pain has previously been with movement only, especially when picking up an object or using hand in repetitive motion, but has been present with rest today prompting her to come to the ED. She denies edema, erythema or rashes over the elbow. Has tried occasional ice and OTC anti-inflammatories.   Past Medical History:  Diagnosis Date  . Allergy   . Anemia   . Benign hematuria    worked up by Dr. Kathie Rhodes  . Chickenpox   . Horseshoe kidney   . Menorrhagia   . Osteoarthritis   . Thyroid disease     Patient Active Problem List   Diagnosis Date Noted  . Diabetes mellitus without complication ([place]) 40/34/7425  . Essential hypertension 04/23/2015  . Hypothyroidism 04/19/2014  . Hives 09/19/2013  . Migraine, unspecified, without mention of intractable migraine without mention of status migrainosus 06/30/2013  . GANGLION CYST 03/18/2010  . CHOLELITHIASIS 10/16/2009  . HORSESHOE KIDNEY 10/16/2009  . BACK PAIN, THORACIC REGION 07/04/2009  . BENIGN POSITIONAL VERTIGO 03/21/2009  . MENORRHAGIA 02/02/2008  . ANEMIA-IRON DEFICIENCY 01/27/2008  . ALLERGIC RHINITIS 01/27/2008  . OSTEOARTHRITIS 01/27/2008    Past Surgical History:  Procedure Laterality Date  . CESAREAN SECTION    . COLONOSCOPY  10/04/2012   per Dr. Hilarie Fredrickson, adenomatous polyps, repeat in 5 yrs   . TUBAL LIGATION    . TYMPANOSTOMY      OB History    No data available       Home Medications    Prior to Admission medications   Medication Sig Start Date End Date Taking? Authorizing Provider  ALPRAZolam  Duanne Moron) 1 MG tablet TAKE 1 TABLET BY MOUTH TWICE A DAY AS NEEDED 10/02/16   Laurey Morale, MD  atorvastatin (LIPITOR) 40 MG tablet Take 1 tablet (40 mg total) by mouth at bedtime. 04/23/16   Laurey Morale, MD  EPINEPHrine (EPI-PEN) 0.3 mg/0.3 mL DEVI Inject 0.3 mg into the muscle once. Reported on 05/25/2015    [provider]  levocetirizine (XYZAL) 5 MG tablet Take 5 mg by mouth every evening.    [provider]  levothyroxine (SYNTHROID, LEVOTHROID) 75 MCG tablet Take 1 tablet (75 mcg total) by mouth daily. 04/23/16   Laurey Morale, MD  lisinopril (PRINIVIL,ZESTRIL) 10 MG tablet TAKE 1 TABLET (10 MG TOTAL) BY MOUTH DAILY. 07/07/16   Laurey Morale, MD  meloxicam (MOBIC) 15 MG tablet Take 1 tablet (15 mg total) by mouth daily. 12/01/16   Nicolette Bang, DO  montelukast (SINGULAIR) 10 MG tablet Take 10 mg by mouth at bedtime.    [provider]  nortriptyline (PAMELOR) 50 MG capsule TAKE 1 CAPSULE (50 MG TOTAL) BY MOUTH AT BEDTIME. 04/21/16   Laurey Morale, MD    Family History Family History  Problem Relation Age of Onset  . Colon polyps Brother   . Arthritis Unknown   . Coronary artery disease Unknown   . Depression Unknown   . Diabetes Unknown   . Hyperlipidemia Unknown   .  Sudden death Unknown     Social History Social History  Substance Use Topics  . Smoking status: Never Smoker  . Smokeless tobacco: Never Used  . Alcohol use No     Allergies   Penicillins   Review of Systems Review of Systems  Constitutional: Negative for activity change and fever.  Gastrointestinal: Negative for nausea and vomiting.  Skin: Negative for color change, rash and wound.     Physical Exam Updated Vital Signs BP 106/68 (BP Location: Right Arm)   Pulse 92   Temp 98.5 F (36.9 C) (Oral)   Resp 16   Ht 5' (1.524 m)   Wt 99.8 kg (220 lb)   SpO2 100%   BMI 42.97 kg/m   Physical Exam  Constitutional: She is oriented to person, place, and time. She  appears well-developed and well-nourished. No distress.  HENT:  Head: Normocephalic and atraumatic.  Eyes: Conjunctivae and EOM are normal.  Neck: Normal range of motion.  Cardiovascular: Normal rate.   Pulmonary/Chest: Effort normal. No respiratory distress.  Musculoskeletal:  Left elbow without edema over olecranon, erythema, or increased warmth. TTP over left lateral epicondyle. No TTP over olecranon process or medial epicondyle. Good ROM at elbow. Pain with resisted wrist extension. Pain with resisted supination and pronation. Strength intact. Sensation to UE intact.   Neurological: She is alert and oriented to person, place, and time.  Skin: Skin is warm and dry. She is not diaphoretic.  Psychiatric: She has a normal mood and affect. Her behavior is normal.     ED Treatments / Results  Labs (all labs ordered are listed, but only abnormal results are displayed) Labs Reviewed - No data to display  EKG  EKG Interpretation None       Radiology Dg Elbow Complete Left  Result Date: 12/01/2016 CLINICAL DATA:  Left elbow pain for 3 weeks. EXAM: LEFT ELBOW - COMPLETE 3+ VIEW COMPARISON:  None. FINDINGS: There is no evidence of fracture, dislocation, or joint effusion. There is no evidence of arthropathy or other focal bone abnormality. Soft tissues are unremarkable. IMPRESSION: Negative. Electronically Signed   By: Fidela Salisbury M.D.   On: 12/01/2016 17:22    Procedures Procedures (including critical care time)  Medications Ordered in ED Medications - No data to display   Initial Impression / Assessment and Plan / ED Course  I have reviewed the triage vital signs and the nursing notes.  Pertinent labs & imaging results that were available during my care of the patient were reviewed by me and considered in my medical decision making (see chart for details).   46 year old female presenting with left elbow pain. X-ray negative for fracture, dislocation, or joint effusion.  Patient is afebrile and there is no evidence of infectious process on exam. Given TTP over lateral epicondyle and pain with certain resisted movements, exam most consistent with lateral epicondylitis. Recommended rest and avoidance of repetitive motions. Prescribed Mobic and recommended ice. Follow up with orthopedics. Cardiology  Final Clinical Impressions(s) / ED Diagnoses   Final diagnoses:  Lateral epicondylitis of left elbow    New Prescriptions New Prescriptions   MELOXICAM (MOBIC) 15 MG TABLET    Take 1 tablet (15 mg total) by mouth daily.     Nicolette Bang, DO 12/01/16 1934    Little, Wenda Overland, MD 12/02/16 1500

## 2016-12-01 NOTE — Discharge Instructions (Signed)
Please call Dr. Susie Cassette clinic for follow up. In the meantime take the anti-inflammatory daily. Use ice and rest the arm. Try to avoid repetitive motions like dish washing.

## 2016-12-01 NOTE — ED Triage Notes (Signed)
patient states that she has had pain to her left elbow x 2 -3 weeks  - denies any injury

## 2017-02-25 ENCOUNTER — Telehealth: Payer: Self-pay | Admitting: Family Medicine

## 2017-02-25 NOTE — Telephone Encounter (Signed)
I spoke with pt, we no longer draw labs before office visit.

## 2017-02-25 NOTE — Telephone Encounter (Signed)
Copied from [place] [phone]. Topic: General - Other >> Feb 25, 2017 11:29 AM Lolita Rieger, RMA wrote: Reason for CRM: Pt has CPE scheduled for 04/23/17 and would like order for labs before appt

## 2017-03-26 ENCOUNTER — Other Ambulatory Visit: Payer: Self-pay | Admitting: Family Medicine

## 2017-03-26 NOTE — Telephone Encounter (Signed)
Last OV 04/23/2016  Nortriptyline last refilled 04-21-2016 disp 90 with 3 refills  Atorvastatin last refilled 04-23-2106 disp 90 with 3 refills

## 2017-04-03 ENCOUNTER — Ambulatory Visit
Admission: RE | Admit: 2017-04-03 | Discharge: 2017-04-03 | Disposition: A | Discharge status: Home / Self Care | Payer: BC Managed Care – PPO | Source: Ambulatory Visit | Attending: Family Medicine | Admitting: Family Medicine

## 2017-04-03 DIAGNOSIS — N63 Unspecified lump in unspecified breast: Secondary | ICD-10-CM

## 2017-04-23 ENCOUNTER — Encounter: Payer: Self-pay | Admitting: Family Medicine

## 2017-04-23 ENCOUNTER — Ambulatory Visit (INDEPENDENT_AMBULATORY_CARE_PROVIDER_SITE_OTHER): Payer: BC Managed Care – PPO | Admitting: Family Medicine

## 2017-04-23 VITALS — BP 100/74 | HR 91 | Temp 98.1°F | Resp 16 | Ht 59.5 in | Wt 218.0 lb

## 2017-04-23 DIAGNOSIS — Z0000 Encounter for general adult medical examination without abnormal findings: Secondary | ICD-10-CM | POA: Diagnosis not present

## 2017-04-23 DIAGNOSIS — E119 Type 2 diabetes mellitus without complications: Secondary | ICD-10-CM

## 2017-04-23 LAB — POC URINALSYSI DIPSTICK (AUTOMATED)
Bilirubin, UA: NEGATIVE
Glucose, UA: NEGATIVE
Ketones, UA: NEGATIVE
Leukocytes, UA: NEGATIVE
Nitrite, UA: NEGATIVE
Spec Grav, UA: 1.02 (ref 1.010–1.025)
Urobilinogen, UA: 0.2 E.U./dL
pH, UA: 6.5 (ref 5.0–8.0)

## 2017-04-23 LAB — BASIC METABOLIC PANEL
BUN: 11 mg/dL (ref 6–23)
CO2: 30 mEq/L (ref 19–32)
Calcium: 9.7 mg/dL (ref 8.4–10.5)
Chloride: 101 mEq/L (ref 96–112)
Creatinine, Ser: 0.83 mg/dL (ref 0.40–1.20)
GFR: 78.14 mL/min (ref >60.00)
Glucose, Bld: 114 mg/dL — ABNORMAL HIGH (ref 70–99)
Potassium: 4.8 mEq/L (ref 3.5–5.1)
Sodium: 138 mEq/L (ref 135–145)

## 2017-04-23 LAB — LIPID PANEL
Cholesterol: 159 mg/dL (ref 0–200)
HDL: 53.3 mg/dL (ref >39.00)
LDL Cholesterol: 79 mg/dL (ref 0–99)
NonHDL: 105.93
Total CHOL/HDL Ratio: 3
Triglycerides: 136 mg/dL (ref 0.0–149.0)
VLDL: 27.2 mg/dL (ref 0.0–40.0)

## 2017-04-23 LAB — CBC WITH DIFFERENTIAL/PLATELET
Basophils Absolute: 0.1 10*3/uL (ref 0.0–0.1)
Basophils Relative: 0.7 % (ref 0.0–3.0)
Eosinophils Absolute: 0.4 10*3/uL (ref 0.0–0.7)
Eosinophils Relative: 3.6 % (ref 0.0–5.0)
HCT: 43.9 % (ref 36.0–46.0)
Hemoglobin: 15.1 g/dL — ABNORMAL HIGH (ref 12.0–15.0)
Lymphocytes Relative: 31.1 % (ref 12.0–46.0)
Lymphs Abs: 3.4 10*3/uL (ref 0.7–4.0)
MCHC: 34.3 g/dL (ref 30.0–36.0)
MCV: 91.5 fl (ref 78.0–100.0)
Monocytes Absolute: 1 10*3/uL (ref 0.1–1.0)
Monocytes Relative: 8.8 % (ref 3.0–12.0)
Neutro Abs: 6.1 10*3/uL (ref 1.4–7.7)
Neutrophils Relative %: 55.8 % (ref 43.0–77.0)
Platelets: 293 10*3/uL (ref 150.0–400.0)
RBC: 4.8 Mil/uL (ref 3.87–5.11)
RDW: 14.1 % (ref 11.5–15.5)
WBC: 11 10*3/uL — ABNORMAL HIGH (ref 4.0–10.5)

## 2017-04-23 LAB — HEPATIC FUNCTION PANEL
ALT: 60 U/L — ABNORMAL HIGH (ref 0–35)
AST: 40 U/L — ABNORMAL HIGH (ref 0–37)
Albumin: 3.9 g/dL (ref 3.5–5.2)
Alkaline Phosphatase: 77 U/L (ref 39–117)
Bilirubin, Direct: 0.1 mg/dL (ref 0.0–0.3)
Total Bilirubin: 0.5 mg/dL (ref 0.2–1.2)
Total Protein: 7.6 g/dL (ref 6.0–8.3)

## 2017-04-23 LAB — TSH: TSH: 4.14 u[IU]/mL (ref 0.35–4.50)

## 2017-04-23 LAB — HEMOGLOBIN A1C: Hgb A1c MFr Bld: 6.3 % (ref 4.6–6.5)

## 2017-04-23 MED ORDER — LEVOTHYROXINE SODIUM 75 MCG PO TABS: 75.0000 ug | ORAL_TABLET | Freq: Every day | ORAL | 3 refills | Status: DC

## 2017-04-23 NOTE — Progress Notes (Signed)
   Subjective:    Patient ID: Zoe [name], female    DOB: 1970-10-22, 47 y.o.   MRN: [id number]  HPI Here for a well exam. She feels well.    Review of Systems  Constitutional: Negative.   HENT: Negative.   Eyes: Negative.   Respiratory: Negative.   Cardiovascular: Negative.   Gastrointestinal: Negative.   Genitourinary: Negative for decreased urine volume, difficulty urinating, dyspareunia, dysuria, enuresis, flank pain, frequency, hematuria, pelvic pain and urgency.  Musculoskeletal: Negative.   Skin: Negative.   Neurological: Negative.   Psychiatric/Behavioral: Negative.        Objective:   Physical Exam  Constitutional: She is oriented to person, place, and time. She appears well-developed and well-nourished. No distress.  HENT:  Head: Normocephalic and atraumatic.  Right Ear: External ear normal.  Left Ear: External ear normal.  Nose: Nose normal.  Mouth/Throat: Oropharynx is clear and moist. No oropharyngeal exudate.  Eyes: Conjunctivae and EOM are normal. Pupils are equal, round, and reactive to light. No scleral icterus.  Neck: Normal range of motion. Neck supple. No JVD present. No thyromegaly present.  Cardiovascular: Normal rate, regular rhythm, normal heart sounds and intact distal pulses. Exam reveals no gallop and no friction rub.  No murmur heard. Pulmonary/Chest: Effort normal and breath sounds normal. No respiratory distress. She has no wheezes. She has no rales. She exhibits no tenderness.  Abdominal: Soft. Bowel sounds are normal. She exhibits no distension and no mass. There is no tenderness. There is no rebound and no guarding.  Musculoskeletal: Normal range of motion. She exhibits no edema or tenderness.  Lymphadenopathy:    She has no cervical adenopathy.  Neurological: She is alert and oriented to person, place, and time. She has normal reflexes. No cranial nerve deficit. She exhibits normal muscle tone. Coordination normal.  Skin: Skin is warm  and dry. No rash noted. No erythema.  Psychiatric: She has a normal mood and affect. Her behavior is normal. Judgment and thought content normal.          Assessment & Plan:  Well exam. We discussed diet and exercise. Get fasting labs. She will be due for another colonoscopy this August.  Alysia Penna, MD

## 2017-06-01 ENCOUNTER — Encounter: Payer: Self-pay | Admitting: Family Medicine

## 2017-06-01 LAB — HM DIABETES EYE EXAM

## 2017-06-04 ENCOUNTER — Encounter: Payer: Self-pay | Admitting: Family Medicine

## 2017-06-16 ENCOUNTER — Other Ambulatory Visit: Payer: Self-pay | Admitting: Family Medicine

## 2017-06-29 ENCOUNTER — Other Ambulatory Visit: Payer: Self-pay | Admitting: Family Medicine

## 2017-07-14 ENCOUNTER — Encounter: Payer: Self-pay | Admitting: *Deleted

## 2017-08-16 ENCOUNTER — Encounter: Payer: Self-pay | Admitting: Internal Medicine

## 2017-09-14 ENCOUNTER — Encounter: Payer: Self-pay | Admitting: Internal Medicine

## 2017-09-18 ENCOUNTER — Other Ambulatory Visit: Payer: Self-pay | Admitting: Family Medicine

## 2017-11-05 ENCOUNTER — Encounter: Payer: Self-pay | Admitting: Internal Medicine

## 2017-11-05 ENCOUNTER — Ambulatory Visit (AMBULATORY_SURGERY_CENTER): Payer: Self-pay | Admitting: *Deleted

## 2017-11-05 ENCOUNTER — Other Ambulatory Visit: Payer: Self-pay

## 2017-11-05 VITALS — Ht 60.0 in | Wt 217.6 lb

## 2017-11-05 DIAGNOSIS — Z860100 Personal history of colon polyps, unspecified: Secondary | ICD-10-CM

## 2017-11-05 MED ORDER — SUPREP BOWEL PREP KIT 17.5-3.13-1.6 GM/177ML PO SOLN: 1.0000 | Freq: Once | ORAL | 0 refills | Status: AC

## 2017-11-05 NOTE — Progress Notes (Signed)
Patient denies any allergies to egg or soy products. Patient denies complications with anesthesia/sedation.  Patient denies oxygen use at home and denies diet medications.  Patient  Denies information on colonoscopy.

## 2017-11-16 ENCOUNTER — Ambulatory Visit (AMBULATORY_SURGERY_CENTER): Payer: BC Managed Care – PPO | Admitting: Internal Medicine

## 2017-11-16 ENCOUNTER — Encounter: Payer: Self-pay | Admitting: Internal Medicine

## 2017-11-16 ENCOUNTER — Other Ambulatory Visit: Payer: Self-pay

## 2017-11-16 VITALS — BP 105/69 | HR 73 | Temp 98.4°F | Resp 16 | Ht 60.0 in | Wt 217.0 lb

## 2017-11-16 DIAGNOSIS — Z86010 Personal history of colonic polyps: Secondary | ICD-10-CM

## 2017-11-16 HISTORY — PX: COLONOSCOPY: SHX174

## 2017-11-16 MED ORDER — SODIUM CHLORIDE 0.9 % IV SOLN: 500.0000 mL | Freq: Once | INTRAVENOUS | Status: DC

## 2017-11-16 NOTE — Progress Notes (Signed)
To PACU, VSS> report to [email or web address]

## 2017-11-16 NOTE — Patient Instructions (Signed)
YOU HAD AN ENDOSCOPIC PROCEDURE TODAY AT [facility]:   Refer to the procedure report that was given to you for any specific questions about what was found during the examination.  If the procedure report does not answer your questions, please call your gastroenterologist to clarify.  If you requested that your care partner not be given the details of your procedure findings, then the procedure report has been included in a sealed envelope for you to review at your convenience later.  YOU SHOULD EXPECT: Some feelings of bloating in the abdomen. Passage of more gas than usual.  Walking can help get rid of the air that was put into your GI tract during the procedure and reduce the bloating. If you had a lower endoscopy (such as a colonoscopy or flexible sigmoidoscopy) you may notice spotting of blood in your stool or on the toilet paper. If you underwent a bowel prep for your procedure, you may not have a normal bowel movement for a few days.  Please Note:  You might notice some irritation and congestion in your nose or some drainage.  This is from the oxygen used during your procedure.  There is no need for concern and it should clear up in a day or so.  SYMPTOMS TO REPORT IMMEDIATELY:   Following lower endoscopy (colonoscopy or flexible sigmoidoscopy):  Excessive amounts of blood in the stool  Significant tenderness or worsening of abdominal pains  Swelling of the abdomen that is new, acute  Fever of 100F or higher  For urgent or emergent issues, a gastroenterologist can be reached at any hour by calling [phone].   DIET:  We do recommend a small meal at first, but then you may proceed to your regular diet.  Drink plenty of fluids but you should avoid alcoholic beverages for 24 hours.  ACTIVITY:  You should plan to take it easy for the rest of today and you should NOT DRIVE or use heavy machinery until tomorrow (because of the sedation medicines used during the test).     FOLLOW UP: Our staff will call the number listed on your records the next business day following your procedure to check on you and address any questions or concerns that you may have regarding the information given to you following your procedure. If we do not reach you, we will leave a message.  However, if you are feeling well and you are not experiencing any problems, there is no need to return our call.  We will assume that you have returned to your regular daily activities without incident.  If any biopsies were taken you will be contacted by phone or by letter within the next 1-3 weeks.  Please call us at [phone] if you have not heard about the biopsies in 3 weeks.   Hemorrhoids (handout given) Diverticulosis (handout given) Repeat next Colonoscopy screening in 5 years  SIGNATURES/CONFIDENTIALITY: You and/or your care partner have signed paperwork which will be entered into your electronic medical record.  These signatures attest to the fact that that the information above on your After Visit Summary has been reviewed and is understood.  Full responsibility of the confidentiality of this discharge information lies with you and/or your care-partner.

## 2017-11-16 NOTE — Progress Notes (Signed)
Pt's states no medical or surgical changes since previsit or office visit. 

## 2017-11-16 NOTE — Op Note (Signed)
[facility] Patient Name: Zoe [name] Procedure Date: 11/16/2017 9:44 AM MRN: [id number] Endoscopist: Jerene Bears , MD Age: 47 Referring MD:  Date of Birth: November 16, 1970 Gender: Female Account #: [id number] Procedure:                Colonoscopy Indications:              High risk colon cancer surveillance: Personal                            history of non-advanced adenoma, Last colonoscopy 5                            years ago Medicines:                Monitored Anesthesia Care Procedure:                Pre-Anesthesia Assessment:                           - Prior to the procedure, a History and Physical                            was performed, and patient medications and                            allergies were reviewed. The patient's tolerance of                            previous anesthesia was also reviewed. The risks                            and benefits of the procedure and the sedation                            options and risks were discussed with the patient.                            All questions were answered, and informed consent                            was obtained. Prior Anticoagulants: The patient has                            taken no previous anticoagulant or antiplatelet                            agents. ASA Grade Assessment: II - A patient with                            mild systemic disease. After reviewing the risks                            and benefits, the patient was deemed in  satisfactory condition to undergo the procedure.                           After obtaining informed consent, the colonoscope                            was passed under direct vision. Throughout the                            procedure, the patient's blood pressure, pulse, and                            oxygen saturations were monitored continuously. The                            Colonoscope was introduced through the anus and                          advanced to the cecum, identified by appendiceal                            orifice and ileocecal valve. The colonoscopy was                            performed without difficulty. The patient tolerated                            the procedure well. The quality of the bowel                            preparation was good. The ileocecal valve,                            appendiceal orifice, and rectum were photographed. Scope In: 9:51:59 AM Scope Out: 10:08:26 AM Scope Withdrawal Time: 0 hours 11 minutes 34 seconds  Total Procedure Duration: 0 hours 16 minutes 27 seconds  Findings:                 Skin tags were found on perianal exam.                           A few small-mouthed diverticula were found in the                            sigmoid colon.                           External and internal hemorrhoids were found during                            retroflexion. The hemorrhoids were small.                           The exam was otherwise without abnormality. Complications:            No immediate complications.  Estimated Blood Loss:     Estimated blood loss: none. Impression:               - Perianal skin tags found on perianal exam.                           - Diverticulosis in the sigmoid colon.                           - External and internal hemorrhoids.                           - The examination was otherwise normal.                           - No specimens collected. Recommendation:           - Patient has a contact number available for                            emergencies. The signs and symptoms of potential                            delayed complications were discussed with the                            patient. Return to normal activities tomorrow.                            Written discharge instructions were provided to the                            patient.                           - Resume previous diet.                           -  Continue present medications.                           - Repeat colonoscopy in 5 years for surveillance. Jerene Bears, MD 11/16/2017 10:13:22 AM This report has been signed electronically.

## 2017-11-17 ENCOUNTER — Telehealth: Payer: Self-pay

## 2017-11-17 NOTE — Telephone Encounter (Signed)
  Follow up Call-  Call back number 11/16/2017  Post procedure Call Back phone  # [phone]  Permission to leave phone message Yes  Some recent data might be hidden     Patient questions:  Do you have a fever, pain , or abdominal swelling? No. Pain Score  0 *  Have you tolerated food without any problems? Yes.    Have you been able to return to your normal activities? Yes.    Do you have any questions about your discharge instructions: Diet   No. Medications  No. Follow up visit  No.  Do you have questions or concerns about your Care? Yes.    Actions: * If pain score is 4 or above: No action needed, pain <4.

## 2018-02-18 ENCOUNTER — Other Ambulatory Visit: Payer: Self-pay | Admitting: Family Medicine

## 2018-02-18 DIAGNOSIS — Z1231 Encounter for screening mammogram for malignant neoplasm of breast: Secondary | ICD-10-CM

## 2018-03-05 ENCOUNTER — Other Ambulatory Visit: Payer: Self-pay | Admitting: Family Medicine

## 2018-03-05 MED ORDER — LEVOTHYROXINE SODIUM 75 MCG PO TABS: 75.0000 ug | ORAL_TABLET | Freq: Every day | ORAL | 0 refills | Status: DC

## 2018-04-05 ENCOUNTER — Ambulatory Visit
Admission: RE | Admit: 2018-04-05 | Discharge: 2018-04-05 | Disposition: A | Discharge status: Home / Self Care | Payer: BC Managed Care – PPO | Source: Ambulatory Visit | Attending: Family Medicine | Admitting: Family Medicine

## 2018-04-05 DIAGNOSIS — Z1231 Encounter for screening mammogram for malignant neoplasm of breast: Secondary | ICD-10-CM

## 2018-04-26 ENCOUNTER — Encounter: Payer: Self-pay | Admitting: Family Medicine

## 2018-04-26 ENCOUNTER — Ambulatory Visit (INDEPENDENT_AMBULATORY_CARE_PROVIDER_SITE_OTHER): Payer: BC Managed Care – PPO | Admitting: Family Medicine

## 2018-04-26 VITALS — BP 116/68 | HR 88 | Temp 98.0°F | Ht 61.0 in | Wt 224.5 lb

## 2018-04-26 DIAGNOSIS — E119 Type 2 diabetes mellitus without complications: Secondary | ICD-10-CM | POA: Diagnosis not present

## 2018-04-26 DIAGNOSIS — E038 Other specified hypothyroidism: Secondary | ICD-10-CM

## 2018-04-26 DIAGNOSIS — Z0000 Encounter for general adult medical examination without abnormal findings: Secondary | ICD-10-CM | POA: Diagnosis not present

## 2018-04-26 DIAGNOSIS — Z23 Encounter for immunization: Secondary | ICD-10-CM | POA: Diagnosis not present

## 2018-04-26 LAB — LIPID PANEL
Cholesterol: 159 mg/dL (ref 0–200)
HDL: 47.6 mg/dL (ref >39.00)
LDL Cholesterol: 78 mg/dL (ref 0–99)
NonHDL: 111.69
Total CHOL/HDL Ratio: 3
Triglycerides: 170 mg/dL — ABNORMAL HIGH (ref 0.0–149.0)
VLDL: 34 mg/dL (ref 0.0–40.0)

## 2018-04-26 LAB — POC URINALSYSI DIPSTICK (AUTOMATED)
Bilirubin, UA: NEGATIVE
Blood, UA: NEGATIVE
Glucose, UA: NEGATIVE
Ketones, UA: NEGATIVE
Leukocytes, UA: NEGATIVE
Nitrite, UA: NEGATIVE
Protein, UA: POSITIVE — AB
Spec Grav, UA: 1.025 (ref 1.010–1.025)
Urobilinogen, UA: 0.2 E.U./dL
pH, UA: 6 (ref 5.0–8.0)

## 2018-04-26 LAB — CBC WITH DIFFERENTIAL/PLATELET
Basophils Absolute: 0.1 10*3/uL (ref 0.0–0.1)
Basophils Relative: 0.7 % (ref 0.0–3.0)
Eosinophils Absolute: 0.4 10*3/uL (ref 0.0–0.7)
Eosinophils Relative: 3 % (ref 0.0–5.0)
HCT: 43.1 % (ref 36.0–46.0)
Hemoglobin: 14.7 g/dL (ref 12.0–15.0)
Lymphocytes Relative: 23.9 % (ref 12.0–46.0)
Lymphs Abs: 2.9 10*3/uL (ref 0.7–4.0)
MCHC: 34.2 g/dL (ref 30.0–36.0)
MCV: 93.5 fl (ref 78.0–100.0)
Monocytes Absolute: 1 10*3/uL (ref 0.1–1.0)
Monocytes Relative: 8.4 % (ref 3.0–12.0)
Neutro Abs: 7.9 10*3/uL — ABNORMAL HIGH (ref 1.4–7.7)
Neutrophils Relative %: 64 % (ref 43.0–77.0)
Platelets: 317 10*3/uL (ref 150.0–400.0)
RBC: 4.61 Mil/uL (ref 3.87–5.11)
RDW: 15.1 % (ref 11.5–15.5)
WBC: 12.3 10*3/uL — ABNORMAL HIGH (ref 4.0–10.5)

## 2018-04-26 LAB — T3, FREE: T3, Free: 4.1 pg/mL (ref 2.3–4.2)

## 2018-04-26 LAB — BASIC METABOLIC PANEL
BUN: 14 mg/dL (ref 6–23)
CO2: 29 mEq/L (ref 19–32)
Calcium: 9.3 mg/dL (ref 8.4–10.5)
Chloride: 100 mEq/L (ref 96–112)
Creatinine, Ser: 0.72 mg/dL (ref 0.40–1.20)
GFR: 86.26 mL/min (ref >60.00)
Glucose, Bld: 107 mg/dL — ABNORMAL HIGH (ref 70–99)
Potassium: 4.4 mEq/L (ref 3.5–5.1)
Sodium: 138 mEq/L (ref 135–145)

## 2018-04-26 LAB — HEPATIC FUNCTION PANEL
ALT: 86 U/L — ABNORMAL HIGH (ref 0–35)
AST: 57 U/L — ABNORMAL HIGH (ref 0–37)
Albumin: 4.1 g/dL (ref 3.5–5.2)
Alkaline Phosphatase: 91 U/L (ref 39–117)
Bilirubin, Direct: 0.1 mg/dL (ref 0.0–0.3)
Total Bilirubin: 0.6 mg/dL (ref 0.2–1.2)
Total Protein: 7.3 g/dL (ref 6.0–8.3)

## 2018-04-26 LAB — HEMOGLOBIN A1C: Hgb A1c MFr Bld: 6.6 % — ABNORMAL HIGH (ref 4.6–6.5)

## 2018-04-26 LAB — T4, FREE: Free T4: 0.71 ng/dL (ref 0.60–1.60)

## 2018-04-26 LAB — TSH: TSH: 4.09 u[IU]/mL (ref 0.35–4.50)

## 2018-04-26 MED ORDER — NORTRIPTYLINE HCL 50 MG PO CAPS: 50.0000 mg | ORAL_CAPSULE | Freq: Every day | ORAL | 3 refills | Status: DC

## 2018-04-26 MED ORDER — LEVOTHYROXINE SODIUM 75 MCG PO TABS: 75.0000 ug | ORAL_TABLET | Freq: Every day | ORAL | 3 refills | Status: DC

## 2018-04-26 NOTE — Progress Notes (Signed)
   Subjective:    Patient ID: Zoe [name], female    DOB: [birth year], 48 y.o.   MRN: [id number]  HPI Here for a well exam. She feels fine except her seasonal allergies are acting up.   Review of Systems  Constitutional: Negative.   HENT: Negative.   Eyes: Negative.   Respiratory: Negative.   Cardiovascular: Negative.   Gastrointestinal: Negative.   Genitourinary: Negative for decreased urine volume, difficulty urinating, dyspareunia, dysuria, enuresis, flank pain, frequency, hematuria, pelvic pain and urgency.  Musculoskeletal: Negative.   Skin: Negative.   Neurological: Negative.   Psychiatric/Behavioral: Negative.        Objective:   Physical Exam Constitutional:      General: She is not in acute distress.    Appearance: She is well-developed.  HENT:     Head: Normocephalic and atraumatic.     Right Ear: External ear normal.     Left Ear: External ear normal.     Nose: Nose normal.     Mouth/Throat:     Pharynx: No oropharyngeal exudate.  Eyes:     General: No scleral icterus.    Conjunctiva/sclera: Conjunctivae normal.     Pupils: Pupils are equal, round, and reactive to light.  Neck:     Musculoskeletal: Normal range of motion and neck supple.     Thyroid: No thyromegaly.     Vascular: No JVD.  Cardiovascular:     Rate and Rhythm: Normal rate and regular rhythm.     Heart sounds: Normal heart sounds. No murmur. No friction rub. No gallop.   Pulmonary:     Effort: Pulmonary effort is normal. No respiratory distress.     Breath sounds: Normal breath sounds. No wheezing or rales.  Chest:     Chest wall: No tenderness.  Abdominal:     General: Bowel sounds are normal. There is no distension.     Palpations: Abdomen is soft. There is no mass.     Tenderness: There is no abdominal tenderness. There is no guarding or rebound.  Musculoskeletal: Normal range of motion.        General: No tenderness.  Lymphadenopathy:     Cervical: No cervical adenopathy.    Skin:    General: Skin is warm and dry.     Findings: No erythema or rash.  Neurological:     Mental Status: She is alert and oriented to person, place, and time.     Cranial Nerves: No cranial nerve deficit.     Motor: No abnormal muscle tone.     Coordination: Coordination normal.     Deep Tendon Reflexes: Reflexes are normal and symmetric. Reflexes normal.  Psychiatric:        Behavior: Behavior normal.        Thought Content: Thought content normal.        Judgment: Judgment normal.           Assessment & Plan:  Well exam. We discussed diet and exercise. Get fasting labs.                                                                    Alysia Penna, MD

## 2018-04-28 ENCOUNTER — Encounter: Payer: Self-pay | Admitting: *Deleted

## 2018-06-24 ENCOUNTER — Other Ambulatory Visit: Payer: Self-pay | Admitting: Family Medicine

## 2018-07-21 LAB — HM DIABETES EYE EXAM

## 2018-09-14 ENCOUNTER — Other Ambulatory Visit: Payer: Self-pay | Admitting: Family Medicine

## 2018-09-19 ENCOUNTER — Encounter: Payer: Self-pay | Admitting: Family Medicine

## 2018-09-20 NOTE — Telephone Encounter (Signed)
Ok to fill in Dr. Barbie Banner absence?

## 2018-09-21 MED ORDER — MONTELUKAST SODIUM 10 MG PO TABS: 10.0000 mg | ORAL_TABLET | Freq: Every day | ORAL | 0 refills | Status: DC

## 2018-09-21 NOTE — Telephone Encounter (Signed)
If cannot wait until dr Sarajane Jews returns  To decide on  Taking over this med     But cannot   Speak for his  Continuing to refill.  Ok to send in  singulair  10 mg disp 30 no refills

## 2018-10-26 ENCOUNTER — Encounter: Payer: Self-pay | Admitting: Family Medicine

## 2018-10-28 MED ORDER — MONTELUKAST SODIUM 10 MG PO TABS: 10.0000 mg | ORAL_TABLET | Freq: Every day | ORAL | 3 refills | Status: DC

## 2018-10-28 MED ORDER — AZELASTINE HCL 0.05 % OP SOLN: 1.0000 [drp] | OPHTHALMIC | 11 refills | Status: DC | PRN

## 2018-10-28 MED ORDER — LEVOCETIRIZINE DIHYDROCHLORIDE 5 MG PO TABS: 5.0000 mg | ORAL_TABLET | Freq: Every evening | ORAL | 3 refills | Status: DC

## 2018-10-28 MED ORDER — FLUTICASONE PROPIONATE 50 MCG/ACT NA SUSP: 2.0000 | Freq: Every day | NASAL | 11 refills | Status: DC

## 2018-10-28 MED ORDER — ALPRAZOLAM 1 MG PO TABS: 1.0000 mg | ORAL_TABLET | Freq: Two times a day (BID) | ORAL | 5 refills | Status: DC | PRN

## 2018-10-28 NOTE — Telephone Encounter (Signed)
All these were sent in

## 2019-01-12 ENCOUNTER — Encounter: Payer: Self-pay | Admitting: Family Medicine

## 2019-01-13 NOTE — Telephone Encounter (Signed)
Please advise 

## 2019-01-14 ENCOUNTER — Other Ambulatory Visit: Payer: Self-pay

## 2019-01-14 ENCOUNTER — Encounter: Payer: Self-pay | Admitting: Family Medicine

## 2019-01-14 ENCOUNTER — Ambulatory Visit: Payer: BC Managed Care – PPO | Admitting: Family Medicine

## 2019-01-14 VITALS — BP 110/70 | HR 105 | Temp 98.2°F | Ht 61.0 in | Wt 220.0 lb

## 2019-01-14 DIAGNOSIS — Z23 Encounter for immunization: Secondary | ICD-10-CM

## 2019-01-14 DIAGNOSIS — M19041 Primary osteoarthritis, right hand: Secondary | ICD-10-CM

## 2019-01-14 DIAGNOSIS — M19042 Primary osteoarthritis, left hand: Secondary | ICD-10-CM

## 2019-01-14 NOTE — Progress Notes (Signed)
   Subjective:    Patient ID: Zoe [name], female    DOB: 12/03/1970, 48 y.o.   MRN: [id number]  HPI Here for pain and stiffness in both hands that has flared up worse than usual this week. They do not swell. The PIP joints are the most affected joints. She has tried Tylenol and Ibuprofen with little relief. Heat helps.    Review of Systems  Constitutional: Negative.   Respiratory: Negative.   Cardiovascular: Negative.   Musculoskeletal: Positive for arthralgias.       Objective:   Physical Exam Constitutional:      Appearance: Normal appearance.  Cardiovascular:     Rate and Rhythm: Normal rate and regular rhythm.     Pulses: Normal pulses.     Heart sounds: Normal heart sounds.  Musculoskeletal:     Comments: The hands appear normal. She is tender over the 2nd-4th PIP joints on both hands   Neurological:     Mental Status: She is alert.           Assessment & Plan:  Osteoarthritis, she can try Voltaren gel OTC.  Alysia Penna, MD

## 2019-01-14 NOTE — Telephone Encounter (Signed)
Try Diclofenac gel 1%

## 2019-01-19 ENCOUNTER — Other Ambulatory Visit: Payer: Self-pay | Admitting: Family Medicine

## 2019-02-16 ENCOUNTER — Other Ambulatory Visit: Payer: Self-pay | Admitting: Family Medicine

## 2019-04-19 ENCOUNTER — Telehealth (INDEPENDENT_AMBULATORY_CARE_PROVIDER_SITE_OTHER): Payer: BC Managed Care – PPO | Admitting: Adult Health

## 2019-04-19 ENCOUNTER — Other Ambulatory Visit: Payer: Self-pay

## 2019-04-19 ENCOUNTER — Encounter: Payer: Self-pay | Admitting: Adult Health

## 2019-04-19 DIAGNOSIS — M62838 Other muscle spasm: Secondary | ICD-10-CM | POA: Diagnosis not present

## 2019-04-19 MED ORDER — CYCLOBENZAPRINE HCL 10 MG PO TABS: 10.0000 mg | ORAL_TABLET | Freq: Three times a day (TID) | ORAL | 0 refills | Status: DC | PRN

## 2019-04-19 NOTE — Progress Notes (Signed)
Virtual Visit via Video Note  I connected with Zoe [name] on 04/19/19 at  4:30 PM EST by a video enabled telemedicine application and verified that I am speaking with the correct person using two identifiers.  Location patient: home Location provider:work or home office Persons participating in the virtual visit: patient, provider  I discussed the limitations of evaluation and management by telemedicine and the availability of in person appointments. The patient expressed understanding and agreed to proceed.   HPI: She is a patient of Dr. Sarajane Jews who I am seen for an acute issue of muscle spasms in her lower back.  She has had this issue before most recently "a few years ago".  In the past Flexeril has worked well for her and does not make her sleepy or drowsy.  The only aggravating factor that she can think of is that her mother who lives with her has trouble walking and changing positions and she has to help her do so.  She believes that she may have strained her back when helping her mom.  She reports muscle spasms for the last few days was worse yesterday.  Denies numbness or tingling, loss of bowel or bladder or radiating pain down her legs.  Muscle spasms are present when standing, sitting, and laying.   ROS: See pertinent positives and negatives per HPI.  Past Medical History:  Diagnosis Date  . Allergy   . Anemia   . Benign hematuria    worked up by Dr. Kathie Rhodes  . Chickenpox   . Horseshoe kidney   . Hx of migraine headaches   . Hyperlipidemia   . Hypertension   . Menorrhagia    resolved with ablation  . Osteoarthritis    patient denies this dx  . SVD (spontaneous vaginal delivery)    x 1  . Thyroid disease     Past Surgical History:  Procedure Laterality Date  . CESAREAN SECTION     x 2  . CHOLECYSTECTOMY    . COLONOSCOPY  11/16/2017   per Dr. Hilarie Fredrickson, sigmoid diverticulosis, no polyps, repeat in 5 yrs   . SHOULDER SURGERY Right    arthroscopy  . TUBAL  LIGATION    . TYMPANOSTOMY      Family History  Problem Relation Age of Onset  . Colon polyps Brother   . Arthritis Other   . Coronary artery disease Other   . Depression Other   . Diabetes Other   . Hyperlipidemia Other   . Sudden death Other   . Colon cancer Neg Hx   . Rectal cancer Neg Hx   . Stomach cancer Neg Hx        Current Outpatient Medications:  .  ALPRAZolam (XANAX) 1 MG tablet, Take 1 tablet (1 mg total) by mouth 2 (two) times daily as needed., Disp: 60 tablet, Rfl: 5 .  atorvastatin (LIPITOR) 40 MG tablet, TAKE 1 TABLET BY MOUTH AT BEDTIME, Disp: 90 tablet, Rfl: 3 .  fluticasone (FLONASE) 50 MCG/ACT nasal spray, Place 2 sprays into both nostrils daily., Disp: 16 g, Rfl: 11 .  ketoconazole (NIZORAL) 2 % cream, Apply 1 application topically as needed., Disp: , Rfl:  .  levocetirizine (XYZAL) 5 MG tablet, Take 1 tablet (5 mg total) by mouth every evening., Disp: 90 tablet, Rfl: 3 .  levothyroxine (SYNTHROID, LEVOTHROID) 75 MCG tablet, Take 1 tablet (75 mcg total) by mouth daily., Disp: 90 tablet, Rfl: 3 .  lisinopril (ZESTRIL) 10 MG tablet, Take 1 tablet  by mouth once daily, Disp: 90 tablet, Rfl: 0 .  montelukast (SINGULAIR) 10 MG tablet, Take 1 tablet (10 mg total) by mouth at bedtime., Disp: 90 tablet, Rfl: 3 .  nortriptyline (PAMELOR) 50 MG capsule, Take 1 capsule (50 mg total) by mouth at bedtime., Disp: 90 capsule, Rfl: 3 .  azelastine (OPTIVAR) 0.05 % ophthalmic solution, Place 1 drop into both eyes as needed. (Patient not taking: Reported on 04/19/2019), Disp: 6 mL, Rfl: 11  Current Facility-Administered Medications:  .  0.9 %  sodium chloride infusion, 500 mL, Intravenous, Once, Pyrtle, Lajuan Lines, MD  EXAM:  VITALS per patient if applicable:  GENERAL: alert, oriented, appears well and in no acute distress  HEENT: atraumatic, conjunttiva clear, no obvious abnormalities on inspection of external nose and ears  NECK: normal movements of the head and  neck  LUNGS: on inspection no signs of respiratory distress, breathing rate appears normal, no obvious gross SOB, gasping or wheezing  CV: no obvious cyanosis  MS: moves all visible extremities without noticeable abnormality  PSYCH/NEURO: pleasant and cooperative, no obvious depression or anxiety, speech and thought processing grossly intact  ASSESSMENT AND PLAN:  Discussed the following assessment and plan: 1. Muscle spasm - cyclobenzaprine (FLEXERIL) 10 MG tablet; Take 1 tablet (10 mg total) by mouth 3 (three) times daily as needed for muscle spasms.  Dispense: 15 tablet; Refill: 0 -Warm compress as well as gentle stretching exercises.  Follow-up if no improvement in the next 2 to 3 days    I discussed the assessment and treatment plan with the patient. The patient was provided an opportunity to ask questions and all were answered. The patient agreed with the plan and demonstrated an understanding of the instructions.   The patient was advised to call back or seek an in-person evaluation if the symptoms worsen or if the condition fails to improve as anticipated.   Dorothyann Peng, NP

## 2019-04-22 ENCOUNTER — Ambulatory Visit: Payer: BC Managed Care – PPO | Admitting: Family Medicine

## 2019-05-05 ENCOUNTER — Other Ambulatory Visit: Payer: Self-pay

## 2019-05-06 ENCOUNTER — Encounter: Payer: Self-pay | Admitting: Family Medicine

## 2019-05-06 ENCOUNTER — Ambulatory Visit (INDEPENDENT_AMBULATORY_CARE_PROVIDER_SITE_OTHER): Payer: BC Managed Care – PPO | Admitting: Family Medicine

## 2019-05-06 VITALS — BP 122/80 | HR 100 | Temp 98.0°F | Ht 61.0 in | Wt 218.4 lb

## 2019-05-06 DIAGNOSIS — E119 Type 2 diabetes mellitus without complications: Secondary | ICD-10-CM | POA: Diagnosis not present

## 2019-05-06 DIAGNOSIS — E785 Hyperlipidemia, unspecified: Secondary | ICD-10-CM | POA: Diagnosis not present

## 2019-05-06 DIAGNOSIS — R739 Hyperglycemia, unspecified: Secondary | ICD-10-CM

## 2019-05-06 DIAGNOSIS — Z0000 Encounter for general adult medical examination without abnormal findings: Secondary | ICD-10-CM

## 2019-05-06 LAB — CBC WITH DIFFERENTIAL/PLATELET
Basophils Absolute: 0.1 10*3/uL (ref 0.0–0.1)
Basophils Relative: 1.1 % (ref 0.0–3.0)
Eosinophils Absolute: 0.3 10*3/uL (ref 0.0–0.7)
Eosinophils Relative: 3.2 % (ref 0.0–5.0)
HCT: 41.9 % (ref 36.0–46.0)
Hemoglobin: 14.7 g/dL (ref 12.0–15.0)
Lymphocytes Relative: 30.9 % (ref 12.0–46.0)
Lymphs Abs: 3.2 10*3/uL (ref 0.7–4.0)
MCHC: 35.1 g/dL (ref 30.0–36.0)
MCV: 92.4 fl (ref 78.0–100.0)
Monocytes Absolute: 0.9 10*3/uL (ref 0.1–1.0)
Monocytes Relative: 8.5 % (ref 3.0–12.0)
Neutro Abs: 5.8 10*3/uL (ref 1.4–7.7)
Neutrophils Relative %: 56.3 % (ref 43.0–77.0)
Platelets: 270 10*3/uL (ref 150.0–400.0)
RBC: 4.53 Mil/uL (ref 3.87–5.11)
RDW: 13.9 % (ref 11.5–15.5)
WBC: 10.4 10*3/uL (ref 4.0–10.5)

## 2019-05-06 LAB — LIPID PANEL
Cholesterol: 177 mg/dL (ref 0–200)
HDL: 48.4 mg/dL (ref >39.00)
LDL Cholesterol: 103 mg/dL — ABNORMAL HIGH (ref 0–99)
NonHDL: 128.88
Total CHOL/HDL Ratio: 4
Triglycerides: 130 mg/dL (ref 0.0–149.0)
VLDL: 26 mg/dL (ref 0.0–40.0)

## 2019-05-06 LAB — BASIC METABOLIC PANEL
BUN: 10 mg/dL (ref 6–23)
CO2: 28 mEq/L (ref 19–32)
Calcium: 9.4 mg/dL (ref 8.4–10.5)
Chloride: 99 mEq/L (ref 96–112)
Creatinine, Ser: 0.66 mg/dL (ref 0.40–1.20)
GFR: 94.97 mL/min (ref >60.00)
Glucose, Bld: 178 mg/dL — ABNORMAL HIGH (ref 70–99)
Potassium: 4.3 mEq/L (ref 3.5–5.1)
Sodium: 134 mEq/L — ABNORMAL LOW (ref 135–145)

## 2019-05-06 LAB — HEPATIC FUNCTION PANEL
ALT: 158 U/L — ABNORMAL HIGH (ref 0–35)
AST: 136 U/L — ABNORMAL HIGH (ref 0–37)
Albumin: 4 g/dL (ref 3.5–5.2)
Alkaline Phosphatase: 108 U/L (ref 39–117)
Bilirubin, Direct: 0.1 mg/dL (ref 0.0–0.3)
Total Bilirubin: 0.6 mg/dL (ref 0.2–1.2)
Total Protein: 7.5 g/dL (ref 6.0–8.3)

## 2019-05-06 LAB — TSH: TSH: 3.41 u[IU]/mL (ref 0.35–4.50)

## 2019-05-06 MED ORDER — LISINOPRIL 10 MG PO TABS: 10.0000 mg | ORAL_TABLET | Freq: Every day | ORAL | 3 refills | Status: DC

## 2019-05-06 MED ORDER — NORTRIPTYLINE HCL 50 MG PO CAPS: 50.0000 mg | ORAL_CAPSULE | Freq: Every day | ORAL | 3 refills | Status: DC

## 2019-05-06 MED ORDER — LEVOTHYROXINE SODIUM 75 MCG PO TABS: 75.0000 ug | ORAL_TABLET | Freq: Every day | ORAL | 3 refills | Status: DC

## 2019-05-06 NOTE — Progress Notes (Signed)
   Subjective:    Patient ID: Zoe [name], female    DOB: [birth year], 49 y.o.   MRN: [id number]  HPI Here for a well exam. She feels fine.    Review of Systems  Constitutional: Negative.   HENT: Negative.   Eyes: Negative.   Respiratory: Negative.   Cardiovascular: Negative.   Gastrointestinal: Negative.   Genitourinary: Negative for decreased urine volume, difficulty urinating, dyspareunia, dysuria, enuresis, flank pain, frequency, hematuria, pelvic pain and urgency.  Musculoskeletal: Negative.   Skin: Negative.   Neurological: Negative.   Psychiatric/Behavioral: Negative.        Objective:   Physical Exam Constitutional:      General: She is not in acute distress.    Appearance: She is well-developed.  HENT:     Head: Normocephalic and atraumatic.     Right Ear: External ear normal.     Left Ear: External ear normal.     Nose: Nose normal.     Mouth/Throat:     Pharynx: No oropharyngeal exudate.  Eyes:     General: No scleral icterus.    Conjunctiva/sclera: Conjunctivae normal.     Pupils: Pupils are equal, round, and reactive to light.  Neck:     Thyroid: No thyromegaly.     Vascular: No JVD.  Cardiovascular:     Rate and Rhythm: Normal rate and regular rhythm.     Heart sounds: Normal heart sounds. No murmur. No friction rub. No gallop.   Pulmonary:     Effort: Pulmonary effort is normal. No respiratory distress.     Breath sounds: Normal breath sounds. No wheezing or rales.  Chest:     Chest wall: No tenderness.  Abdominal:     General: Bowel sounds are normal. There is no distension.     Palpations: Abdomen is soft. There is no mass.     Tenderness: There is no abdominal tenderness. There is no guarding or rebound.  Musculoskeletal:        General: No tenderness. Normal range of motion.     Cervical back: Normal range of motion and neck supple.  Lymphadenopathy:     Cervical: No cervical adenopathy.  Skin:    General: Skin is warm and dry.   Findings: No erythema or rash.  Neurological:     Mental Status: She is alert and oriented to person, place, and time.     Cranial Nerves: No cranial nerve deficit.     Motor: No abnormal muscle tone.     Coordination: Coordination normal.     Deep Tendon Reflexes: Reflexes are normal and symmetric. Reflexes normal.  Psychiatric:        Behavior: Behavior normal.        Thought Content: Thought content normal.        Judgment: Judgment normal.           Assessment & Plan:  Well exam. We discussed diet and exercise. Get fasting labs.  Alysia Penna, MD

## 2019-05-09 ENCOUNTER — Telehealth: Payer: Self-pay | Admitting: Family Medicine

## 2019-05-09 NOTE — Telephone Encounter (Addendum)
Pt received her lab results through my-chart already and saw some alarming things. She is wondering what her PCP thinks.  Informed pt that Sarajane Jews has not had a moment to review the results yet but when done we will reach out to her.   Pt can be reached at [phone]   This note is not being shared with the patient for the following reason: To respect privacy (The patient or proxy has requested that the information not be shared).

## 2019-05-09 NOTE — Telephone Encounter (Signed)
See below

## 2019-05-09 NOTE — Telephone Encounter (Deleted)
Error This note is not being shared with the patient for the following reason: To respect privacy (The patient or proxy has requested that the information not be shared).

## 2019-05-10 DIAGNOSIS — E785 Hyperlipidemia, unspecified: Secondary | ICD-10-CM | POA: Insufficient documentation

## 2019-05-10 NOTE — Telephone Encounter (Signed)
See my Result Note  

## 2019-05-10 NOTE — Addendum Note (Signed)
Addended by: Alysia Penna A on: 05/10/2019 08:07 AM   Modules accepted: Orders

## 2019-05-10 NOTE — Telephone Encounter (Signed)
See below since you are working with him today, thank you.

## 2019-05-13 ENCOUNTER — Other Ambulatory Visit: Payer: Self-pay | Admitting: Family Medicine

## 2019-05-13 DIAGNOSIS — Z1231 Encounter for screening mammogram for malignant neoplasm of breast: Secondary | ICD-10-CM

## 2019-05-23 ENCOUNTER — Telehealth: Payer: Self-pay | Admitting: Family Medicine

## 2019-05-23 MED ORDER — METFORMIN HCL 500 MG PO TABS: 500.0000 mg | ORAL_TABLET | Freq: Two times a day (BID) | ORAL | 3 refills | Status: DC

## 2019-05-23 NOTE — Telephone Encounter (Signed)
Call in Metformin 500 mg BID, #60 with 3 rf. Recheck labs in 90 days

## 2019-05-23 NOTE — Telephone Encounter (Signed)
Pt call and stated that she was her two wks ago and was hoping that she got something for her sugar but didn't .she stated that last night it was 400 and she check it at 9:51 this morning and it was 224 want to know what to do .need a call back.

## 2019-05-26 ENCOUNTER — Other Ambulatory Visit: Payer: Self-pay

## 2019-05-26 ENCOUNTER — Ambulatory Visit
Admission: RE | Admit: 2019-05-26 | Discharge: 2019-05-26 | Disposition: A | Discharge status: Home / Self Care | Payer: BC Managed Care – PPO | Source: Ambulatory Visit | Attending: Family Medicine | Admitting: Family Medicine

## 2019-05-26 DIAGNOSIS — Z1231 Encounter for screening mammogram for malignant neoplasm of breast: Secondary | ICD-10-CM

## 2019-05-30 NOTE — Telephone Encounter (Signed)
error 

## 2019-07-27 LAB — HM DIABETES EYE EXAM

## 2019-08-02 ENCOUNTER — Encounter: Payer: Self-pay | Admitting: Family Medicine

## 2019-08-23 ENCOUNTER — Other Ambulatory Visit (INDEPENDENT_AMBULATORY_CARE_PROVIDER_SITE_OTHER): Payer: BC Managed Care – PPO

## 2019-08-23 ENCOUNTER — Other Ambulatory Visit: Payer: Self-pay

## 2019-08-23 DIAGNOSIS — E785 Hyperlipidemia, unspecified: Secondary | ICD-10-CM | POA: Diagnosis not present

## 2019-08-23 DIAGNOSIS — E119 Type 2 diabetes mellitus without complications: Secondary | ICD-10-CM

## 2019-08-23 LAB — HEMOGLOBIN A1C: Hgb A1c MFr Bld: 6.4 % (ref 4.6–6.5)

## 2019-08-23 LAB — HEPATIC FUNCTION PANEL
ALT: 73 U/L — ABNORMAL HIGH (ref 0–35)
AST: 49 U/L — ABNORMAL HIGH (ref 0–37)
Albumin: 4.3 g/dL (ref 3.5–5.2)
Alkaline Phosphatase: 91 U/L (ref 39–117)
Bilirubin, Direct: 0.1 mg/dL (ref 0.0–0.3)
Total Bilirubin: 0.6 mg/dL (ref 0.2–1.2)
Total Protein: 7.6 g/dL (ref 6.0–8.3)

## 2019-10-10 ENCOUNTER — Other Ambulatory Visit: Payer: Self-pay | Admitting: Family Medicine

## 2019-10-11 NOTE — Telephone Encounter (Signed)
Last filled 10/28/2018 Last OV 05/06/2019  Ok to fill?

## 2019-11-02 ENCOUNTER — Telehealth: Payer: Self-pay | Admitting: Family Medicine

## 2019-11-02 NOTE — Telephone Encounter (Signed)
Pt  Call and want to know will dr.Fry send a RX in for some muscle relaxers,

## 2019-11-02 NOTE — Telephone Encounter (Signed)
Please advise 

## 2019-11-03 MED ORDER — CYCLOBENZAPRINE HCL 10 MG PO TABS
10.0000 mg | ORAL_TABLET | Freq: Three times a day (TID) | ORAL | 5 refills | Status: DC | PRN
Start: 2019-11-03 — End: 2022-02-13

## 2019-11-03 NOTE — Telephone Encounter (Signed)
Call in Flexeril 10 mg to take TID as need for muscle spasms, #60 with 5 rf

## 2019-11-03 NOTE — Telephone Encounter (Signed)
Rx sent in

## 2019-12-12 ENCOUNTER — Other Ambulatory Visit: Payer: Self-pay | Admitting: Family Medicine

## 2019-12-13 ENCOUNTER — Other Ambulatory Visit: Payer: Self-pay

## 2019-12-13 MED ORDER — NORTRIPTYLINE HCL 50 MG PO CAPS: 50.0000 mg | ORAL_CAPSULE | Freq: Every day | ORAL | 3 refills | Status: DC

## 2019-12-14 ENCOUNTER — Other Ambulatory Visit: Payer: Self-pay | Admitting: Family Medicine

## 2020-02-12 ENCOUNTER — Other Ambulatory Visit: Payer: Self-pay | Admitting: Family Medicine

## 2020-02-21 ENCOUNTER — Encounter: Payer: Self-pay | Admitting: Family Medicine

## 2020-02-21 ENCOUNTER — Telehealth (INDEPENDENT_AMBULATORY_CARE_PROVIDER_SITE_OTHER): Payer: BC Managed Care – PPO | Admitting: Family Medicine

## 2020-02-21 VITALS — BP 123/83 | Temp 97.5°F | Ht 59.0 in | Wt 217.6 lb

## 2020-02-21 DIAGNOSIS — R059 Cough, unspecified: Secondary | ICD-10-CM | POA: Diagnosis not present

## 2020-02-21 MED ORDER — ALBUTEROL SULFATE HFA 108 (90 BASE) MCG/ACT IN AERS
2.0000 | INHALATION_SPRAY | Freq: Four times a day (QID) | RESPIRATORY_TRACT | 0 refills | Status: DC | PRN
Start: 2020-02-21 — End: 2022-07-11

## 2020-02-21 MED ORDER — BENZONATATE 100 MG PO CAPS
200.0000 mg | ORAL_CAPSULE | Freq: Two times a day (BID) | ORAL | 0 refills | Status: DC | PRN
Start: 2020-02-21 — End: 2020-04-25

## 2020-02-21 NOTE — Patient Instructions (Signed)
  HOME CARE TIPS:  -[place] COVID19 testing information: [email or web address] [place] [phone] Most pharmacies also offer testing and home test kits.  -I sent the medication(s) we discussed to your pharmacy: Meds ordered this encounter  Medications  . benzonatate (TESSALON PERLES) 100 MG capsule    Sig: Take 2 capsules (200 mg total) by mouth 2 (two) times daily as needed.    Dispense:  20 capsule    Refill:  0  . albuterol (PROAIR HFA) 108 (90 Base) MCG/ACT inhaler    Sig: Inhale 2 puffs into the lungs every 6 (six) hours as needed for wheezing or shortness of breath.    Dispense:  1 each    Refill:  0     -COVID19 outpatient treatment center: [phone] (only call if your Covid test is positive and you are interested in monoclonal antibody treatment which is available to those with risk factors within 7 days of symptom onset)  -can use tylenol or aleve if needed for fevers, aches and pains per instructions  -can use nasal saline a few times per day if nasal congestion, sometime a short course of Afrin nasal spray for 3 days can help as well  -stay hydrated, drink plenty of fluids and eat small healthy meals - avoid dairy  -can take 1000 IU Vit D3 and Vit C lozenges per instructions  -If the Covid test is positive, schedule follow-up visit with your doctor and check out the CDC website for more information on home care, isolation, transmission and treatment for COVID19  -follow up with your doctor in 2-3 days unless improving and feeling better  -stay home while sick, except to seek medical care, and if you have COVID19 please stay home for a minimum of 5 days plus no symptoms, then also wear a mask for an additional 5 days if you are around others.  It was nice to meet you today, and I really hope you are feeling better soon. I help [place] out with telemedicine visits on Tuesdays and Thursdays and am available for visits on those  days. If you have any concerns or questions following this visit please schedule a follow up visit with your Primary Care doctor or seek care at a local urgent care clinic to avoid delays in care.    Seek in person care promptly if your symptoms worsen, new concerns arise or you are not improving with treatment. Call 911 and/or seek emergency care if you symptoms are severe or life threatening.

## 2020-02-21 NOTE — Progress Notes (Signed)
Virtual Visit via Video Note  I connected with Zoe [name]  on 02/21/20 at  4:40 PM EST by a video enabled telemedicine application and verified that I am speaking with the correct person using two identifiers.  Location patient: home, [place] Location provider:work or home office Persons participating in the virtual visit: patient, provider  I discussed the limitations of evaluation and management by telemedicine and the availability of in person appointments. The patient expressed understanding and agreed to proceed.   HPI:  Acute telemedicine visit for sinus issues: -Onset: some sinus issues about 4 weeks ago and reports " she just let it go" as was mild, but got worse the last week -she went to [facility] 2 days ago and was given doxy for a sinus infection and a cough -she is feeling better but still has a cough and some wheezing -mom was sick a few days before she got sick last week -Denies fevers, SOB, CP, NVD, inability to get out of bed/eat/drink -Has tried: cough syrup from [facility] is not help -Pertinent past medical history: allergic rhinitis, dm, htn -Pertinent medication allergies:penicillins -COVID-19 vaccine status: not vaccinated for covid or flu  ROS: See pertinent positives and negatives per HPI.  Past Medical History:  Diagnosis Date  . Allergy   . Anemia   . Benign hematuria    worked up by Dr. Kathie Rhodes  . Chickenpox   . Horseshoe kidney   . Hx of migraine headaches   . Hyperlipidemia   . Hypertension   . Menorrhagia    resolved with ablation  . Osteoarthritis    patient denies this dx  . SVD (spontaneous vaginal delivery)    x 1  . Thyroid disease     Past Surgical History:  Procedure Laterality Date  . CESAREAN SECTION     x 2  . CHOLECYSTECTOMY    . COLONOSCOPY  11/16/2017   per Dr. Hilarie Fredrickson, sigmoid diverticulosis, no polyps, repeat in 5 yrs   . SHOULDER SURGERY Right    arthroscopy  . TUBAL LIGATION    . TYMPANOSTOMY       Current Outpatient  Medications:  .  albuterol (PROAIR HFA) 108 (90 Base) MCG/ACT inhaler, Inhale 2 puffs into the lungs every 6 (six) hours as needed for wheezing or shortness of breath., Disp: 1 each, Rfl: 0 .  ALPRAZolam (XANAX) 1 MG tablet, Take 1 tablet by mouth twice daily as needed, Disp: 60 tablet, Rfl: 5 .  atorvastatin (LIPITOR) 40 MG tablet, TAKE 1 TABLET BY MOUTH AT BEDTIME, Disp: 90 tablet, Rfl: 0 .  benzonatate (TESSALON PERLES) 100 MG capsule, Take 2 capsules (200 mg total) by mouth 2 (two) times daily as needed., Disp: 20 capsule, Rfl: 0 .  cyclobenzaprine (FLEXERIL) 10 MG tablet, Take 1 tablet (10 mg total) by mouth 3 (three) times daily as needed for muscle spasms., Disp: 60 tablet, Rfl: 5 .  doxycycline (MONODOX) 100 MG capsule, Take by mouth., Disp: , Rfl:  .  fluticasone (FLONASE) 50 MCG/ACT nasal spray, Place 2 sprays into both nostrils daily., Disp: 16 g, Rfl: 11 .  ketoconazole (NIZORAL) 2 % cream, Apply 1 application topically as needed., Disp: , Rfl:  .  levocetirizine (XYZAL) 5 MG tablet, TAKE 1 TABLET BY MOUTH ONCE DAILY IN THE EVENING, Disp: 90 tablet, Rfl: 0 .  levothyroxine (SYNTHROID) 75 MCG tablet, Take 1 tablet (75 mcg total) by mouth daily., Disp: 90 tablet, Rfl: 3 .  lisinopril (ZESTRIL) 10 MG tablet, Take 1 tablet (  10 mg total) by mouth daily., Disp: 90 tablet, Rfl: 3 .  metFORMIN (GLUCOPHAGE) 500 MG tablet, TAKE 1 TABLET BY MOUTH TWICE DAILY WITH MEALS, Disp: 180 tablet, Rfl: 1 .  montelukast (SINGULAIR) 10 MG tablet, TAKE 1 TABLET BY MOUTH AT BEDTIME, Disp: 90 tablet, Rfl: 3 .  nortriptyline (PAMELOR) 50 MG capsule, Take 1 capsule (50 mg total) by mouth at bedtime., Disp: 90 capsule, Rfl: 3 .  promethazine-dextromethorphan (PROMETHAZINE-DM) 6.25-15 MG/5ML syrup, Take by mouth., Disp: , Rfl:  .  azelastine (OPTIVAR) 0.05 % ophthalmic solution, Place 1 drop into both eyes as needed. (Patient not taking: Reported on 04/19/2019), Disp: 6 mL, Rfl: 11  Current Facility-Administered  Medications:  .  0.9 %  sodium chloride infusion, 500 mL, Intravenous, Once, Pyrtle, Carie Caddy, MD  EXAM:  VITALS per patient if applicable:  GENERAL: alert, oriented, appears well and in no acute distress  HEENT: atraumatic, conjunttiva clear, no obvious abnormalities on inspection of external nose and ears  NECK: normal movements of the head and neck  LUNGS: on inspection no signs of respiratory distress, breathing rate appears normal, no obvious gross SOB, gasping or wheezing  CV: no obvious cyanosis  MS: moves all visible extremities without noticeable abnormality  PSYCH/NEURO: pleasant and cooperative, no obvious depression or anxiety, speech and thought processing grossly intact  ASSESSMENT AND PLAN:  Discussed the following assessment and plan:  Cough  -we discussed possible serious and likely etiologies, options for evaluation and workup, limitations of telemedicine visit vs in person visit, treatment, treatment risks and precautions. Pt prefers to treat via telemedicine empirically rather than in person at this moment.  Query new viral illness given mom was sick right before she got more sick, versus resolving sinusitis versus other.  Did discuss potential for COVID-19 given peak we are currently seeing in our community.  Discussed options for Covid testing, treatment, potential complications, isolation precautions.  Opted for continuation of the doxycycline, Tessalon for cough and albuterol if needed for wheezing or excessive cough.  Advised to seek prompt in person care if worsening, new symptoms arise, or if is not improving with treatment over the next 1 to 2 days. Discussed options for inperson care if PCP office not available. Did let this patient know that I only do telemedicine on Tuesdays and Thursdays for [place]. Advised to schedule follow up visit with PCP or [facility] if any further questions or concerns to avoid delays in care.   I discussed the assessment and treatment  plan with the patient. The patient was provided an opportunity to ask questions and all were answered. The patient agreed with the plan and demonstrated an understanding of the instructions.     Terressa Koyanagi, DO

## 2020-03-09 ENCOUNTER — Other Ambulatory Visit: Payer: Self-pay

## 2020-03-09 ENCOUNTER — Ambulatory Visit (INDEPENDENT_AMBULATORY_CARE_PROVIDER_SITE_OTHER): Payer: BC Managed Care – PPO

## 2020-03-09 ENCOUNTER — Ambulatory Visit
Admission: EM | Admit: 2020-03-09 | Discharge: 2020-03-09 | Disposition: A | Discharge status: Home / Self Care | Payer: BC Managed Care – PPO | Attending: Emergency Medicine | Admitting: Emergency Medicine

## 2020-03-09 DIAGNOSIS — M79675 Pain in left toe(s): Secondary | ICD-10-CM

## 2020-03-09 DIAGNOSIS — S92515A Nondisplaced fracture of proximal phalanx of left lesser toe(s), initial encounter for closed fracture: Secondary | ICD-10-CM

## 2020-03-09 NOTE — ED Triage Notes (Signed)
Pt states kicked a door last night and hurt her lt pinky toe. C/o pain, swelling, and bruising.

## 2020-03-09 NOTE — ED Provider Notes (Signed)
[facility]    CSN: [id number] Arrival date & time: 03/09/20  0802      History   Chief Complaint Chief Complaint  Patient presents with  . Toe Injury    HPI Zoe [name] is a 50 y.o. female  With history as below presenting for left pinky toe injury.  States she kicked it against a door last night which caused pain and subsequent bruising.  Requesting x-ray.  No numbness or deformity.  Pain worse with ambulating.  Past Medical History:  Diagnosis Date  . Allergy   . Anemia   . Benign hematuria    worked up by Dr. Kathie Rhodes  . Chickenpox   . Horseshoe kidney   . Hx of migraine headaches   . Hyperlipidemia   . Hypertension   . Menorrhagia    resolved with ablation  . Osteoarthritis    patient denies this dx  . SVD (spontaneous vaginal delivery)    x 1  . Thyroid disease     Patient Active Problem List   Diagnosis Date Noted  . Dyslipidemia 05/10/2019  . Diabetes mellitus without complication ([place]) 18/84/1660  . Essential hypertension 04/23/2015  . Hypothyroidism 04/19/2014  . Hives 09/19/2013  . Migraine, unspecified, without mention of intractable migraine without mention of status migrainosus 06/30/2013  . GANGLION CYST 03/18/2010  . CHOLELITHIASIS 10/16/2009  . HORSESHOE KIDNEY 10/16/2009  . BACK PAIN, THORACIC REGION 07/04/2009  . BENIGN POSITIONAL VERTIGO 03/21/2009  . MENORRHAGIA 02/02/2008  . ANEMIA-IRON DEFICIENCY 01/27/2008  . ALLERGIC RHINITIS 01/27/2008  . OSTEOARTHRITIS 01/27/2008    Past Surgical History:  Procedure Laterality Date  . CESAREAN SECTION     x 2  . CHOLECYSTECTOMY    . COLONOSCOPY  11/16/2017   per Dr. Hilarie Fredrickson, sigmoid diverticulosis, no polyps, repeat in 5 yrs   . SHOULDER SURGERY Right    arthroscopy  . TUBAL LIGATION    . TYMPANOSTOMY      OB History   No obstetric history on file.      Home Medications    Prior to Admission medications   Medication Sig Start Date End Date Taking?  Authorizing Provider  albuterol (PROAIR HFA) 108 (90 Base) MCG/ACT inhaler Inhale 2 puffs into the lungs every 6 (six) hours as needed for wheezing or shortness of breath. 02/21/20   Lucretia Kern, DO  ALPRAZolam Duanne Moron) 1 MG tablet Take 1 tablet by mouth twice daily as needed 10/11/19   Laurey Morale, MD  atorvastatin (LIPITOR) 40 MG tablet TAKE 1 TABLET BY MOUTH AT BEDTIME 02/13/20   Laurey Morale, MD  benzonatate (TESSALON PERLES) 100 MG capsule Take 2 capsules (200 mg total) by mouth 2 (two) times daily as needed. 02/21/20   Lucretia Kern, DO  cyclobenzaprine (FLEXERIL) 10 MG tablet Take 1 tablet (10 mg total) by mouth 3 (three) times daily as needed for muscle spasms. 11/03/19   Laurey Morale, MD  fluticasone (FLONASE) 50 MCG/ACT nasal spray Place 2 sprays into both nostrils daily. 10/28/18   Laurey Morale, MD  ketoconazole (NIZORAL) 2 % cream Apply 1 application topically as needed. 04/28/14   [provider]  levocetirizine (XYZAL) 5 MG tablet TAKE 1 TABLET BY MOUTH ONCE DAILY IN THE EVENING 02/13/20   Laurey Morale, MD  levothyroxine (SYNTHROID) 75 MCG tablet Take 1 tablet (75 mcg total) by mouth daily. 05/06/19   Laurey Morale, MD  lisinopril (ZESTRIL) 10 MG tablet Take 1 tablet (  10 mg total) by mouth daily. 05/06/19   Laurey Morale, MD  metFORMIN (GLUCOPHAGE) 500 MG tablet TAKE 1 TABLET BY MOUTH TWICE DAILY WITH MEALS 12/15/19   Laurey Morale, MD  montelukast (SINGULAIR) 10 MG tablet TAKE 1 TABLET BY MOUTH AT BEDTIME 12/12/19   Laurey Morale, MD  nortriptyline (PAMELOR) 50 MG capsule Take 1 capsule (50 mg total) by mouth at bedtime. 12/13/19   Laurey Morale, MD  fexofenadine (ALLEGRA) 180 MG tablet Take 180 mg by mouth daily.    05/19/11  [provider]  Norgestimate-Ethinyl Estradiol Triphasic (TRI-SPRINTEC) 0.18/0.215/0.25 MG-35 MCG tablet Take 1 tablet by mouth daily. 04/01/11 05/19/11  Laurey Morale, MD    Family History Family History  Problem Relation Age of Onset   . Colon polyps Brother   . Arthritis Other   . Coronary artery disease Other   . Depression Other   . Diabetes Other   . Hyperlipidemia Other   . Sudden death Other   . Colon cancer Neg Hx   . Rectal cancer Neg Hx   . Stomach cancer Neg Hx     Social History Social History   Tobacco Use  . Smoking status: Never Smoker  . Smokeless tobacco: Never Used  Vaping Use  . Vaping Use: Never used  Substance Use Topics  . Alcohol use: No    Alcohol/week: 0.0 standard drinks  . Drug use: No     Allergies   Penicillins   Review of Systems As per HPI   Physical Exam Triage Vital Signs ED Triage Vitals [03/09/20 0818]  Enc Vitals Group     BP (!) 149/75     Pulse Rate 97     Resp 18     Temp 98.1 F (36.7 C)     Temp Source Oral     SpO2 96 %     Weight      Height      Head Circumference      Peak Flow      Pain Score 1     Pain Loc      Pain Edu?      Excl. in [place]?    No data found.  Updated Vital Signs BP (!) 149/75 (BP Location: Left Arm)   Pulse 97   Temp 98.1 F (36.7 C) (Oral)   Resp 18   SpO2 96%   Visual Acuity Right Eye Distance:   Left Eye Distance:   Bilateral Distance:    Right Eye Near:   Left Eye Near:    Bilateral Near:     Physical Exam Constitutional:      General: She is not in acute distress. HENT:     Head: Normocephalic and atraumatic.  Eyes:     General: No scleral icterus.    Pupils: Pupils are equal, round, and reactive to light.  Cardiovascular:     Rate and Rhythm: Normal rate.  Pulmonary:     Effort: Pulmonary effort is normal.  Musculoskeletal:        General: Swelling and tenderness present.     Comments: Left fifth toe with proximal swelling, tenderness, bruising.  Neurovascularly intact  Skin:    Coloration: Skin is not jaundiced or pale.     Findings: Bruising present.  Neurological:     Mental Status: She is alert and oriented to person, place, and time.      UC Treatments / Results  Labs (all  labs ordered are  listed, but only abnormal results are displayed) Labs Reviewed - No data to display  EKG   Radiology DG Toe 5th Left  Result Date: 03/09/2020 CLINICAL DATA:  Toe injury EXAM: DG TOE 5TH LEFT COMPARISON:  None. FINDINGS: There is a nondisplaced, extra-articular transverse fracture of the proximal aspect of the left fifth proximal phalanx. No other fracture appreciated. Soft tissue edema. IMPRESSION: There is a nondisplaced, extra-articular transverse fracture of the proximal aspect of the left fifth proximal phalanx. Electronically Signed   By: Eddie Candle M.D.   On: 03/09/2020 08:39    Procedures Procedures (including critical care time)  Medications Ordered in UC Medications - No data to display  Initial Impression / Assessment and Plan / UC Course  I have reviewed the triage vital signs and the nursing notes.  Pertinent labs & imaging results that were available during my care of the patient were reviewed by me and considered in my medical decision making (see chart for details).     X-ray significant for proximal fifth phalanx fracture.  Given postop shoe and Ortho follow-up information.  We will continue supportive measures as below.  Return precautions discussed, pt verbalized understanding and is agreeable to plan. Final Clinical Impressions(s) / UC Diagnoses   Final diagnoses:  Closed nondisplaced fracture of proximal phalanx of lesser toe of left foot, initial encounter     Discharge Instructions     RICE: rest, ice, compression, elevation as needed for pain.    Pain medication:  350 mg-1000 mg of Tylenol (acetaminophen) and/or 200 mg - 800 mg of Advil (ibuprofen, Motrin) every 8 hours as needed.  May alternate between the two throughout the day as they are generally safe to take together.  DO NOT exceed more than 3000 mg of Tylenol or 3200 mg of ibuprofen in a 24 hour period as this could damage your stomach, kidneys, liver, or increase your bleeding  risk.  Important to follow up with specialist(s) below for further evaluation/management if your symptoms persist or worsen.    ED Prescriptions    None     PDMP not reviewed this encounter.   [place], [place], [place] 03/09/20 [phone]

## 2020-03-09 NOTE — Discharge Instructions (Addendum)

## 2020-04-25 ENCOUNTER — Other Ambulatory Visit: Payer: Self-pay

## 2020-04-25 ENCOUNTER — Ambulatory Visit: Payer: BC Managed Care – PPO | Admitting: Family Medicine

## 2020-04-25 ENCOUNTER — Encounter: Payer: Self-pay | Admitting: Family Medicine

## 2020-04-25 VITALS — BP 128/82 | HR 101 | Temp 98.8°F | Wt 222.8 lb

## 2020-04-25 DIAGNOSIS — K047 Periapical abscess without sinus: Secondary | ICD-10-CM

## 2020-04-25 MED ORDER — CLINDAMYCIN HCL 300 MG PO CAPS: 300.0000 mg | ORAL_CAPSULE | Freq: Three times a day (TID) | ORAL | 0 refills | Status: DC

## 2020-04-25 NOTE — Progress Notes (Signed)
   Subjective:    Patient ID: Zoe [name], female    DOB: [birth year], 50 y.o.   MRN: [id number]  HPI Here for 2 days of swelling and pain in the right side of her face in front of the ear. No fever. No other symptoms. No ST.   Review of Systems  Constitutional: Negative.   HENT: Positive for ear pain and facial swelling. Negative for sinus pain and sore throat.   Eyes: Negative.   Respiratory: Negative.   Cardiovascular: Negative.        Objective:   Physical Exam Constitutional:      General: She is not in acute distress.    Appearance: Normal appearance.  HENT:     Right Ear: Tympanic membrane, ear canal and external ear normal.     Left Ear: Tympanic membrane, ear canal and external ear normal.     Nose: Nose normal.     Mouth/Throat:     Comments: The right lower jaw has 2 broken off molars with tender swollen gums around them  Eyes:     Conjunctiva/sclera: Conjunctivae normal.  Cardiovascular:     Rate and Rhythm: Normal rate and regular rhythm.     Pulses: Normal pulses.     Heart sounds: Normal heart sounds.  Pulmonary:     Effort: Pulmonary effort is normal.     Breath sounds: Normal breath sounds.  Lymphadenopathy:     Cervical: No cervical adenopathy.  Neurological:     Mental Status: She is alert.           Assessment & Plan:  Dental abscess. Treat with 10 days of Clindamycin. She may apply warm compresses. She needs to see her dentist asap.  Alysia Penna, MD

## 2020-05-06 ENCOUNTER — Other Ambulatory Visit: Payer: Self-pay | Admitting: Family Medicine

## 2020-05-23 ENCOUNTER — Encounter: Payer: Self-pay | Admitting: Family Medicine

## 2020-05-23 ENCOUNTER — Ambulatory Visit (INDEPENDENT_AMBULATORY_CARE_PROVIDER_SITE_OTHER): Payer: BC Managed Care – PPO | Admitting: Family Medicine

## 2020-05-23 ENCOUNTER — Other Ambulatory Visit: Payer: Self-pay

## 2020-05-23 VITALS — BP 98/76 | HR 97 | Temp 98.3°F | Ht 59.0 in | Wt 219.0 lb

## 2020-05-23 DIAGNOSIS — Z0000 Encounter for general adult medical examination without abnormal findings: Secondary | ICD-10-CM | POA: Diagnosis not present

## 2020-05-23 DIAGNOSIS — E119 Type 2 diabetes mellitus without complications: Secondary | ICD-10-CM | POA: Diagnosis not present

## 2020-05-23 LAB — HEPATIC FUNCTION PANEL
ALT: 78 U/L — ABNORMAL HIGH (ref 0–35)
AST: 62 U/L — ABNORMAL HIGH (ref 0–37)
Albumin: 4.2 g/dL (ref 3.5–5.2)
Alkaline Phosphatase: 98 U/L (ref 39–117)
Bilirubin, Direct: 0.1 mg/dL (ref 0.0–0.3)
Total Bilirubin: 0.6 mg/dL (ref 0.2–1.2)
Total Protein: 7.6 g/dL (ref 6.0–8.3)

## 2020-05-23 LAB — CBC WITH DIFFERENTIAL/PLATELET
Basophils Absolute: 0.1 10*3/uL (ref 0.0–0.1)
Basophils Relative: 0.8 % (ref 0.0–3.0)
Eosinophils Absolute: 0.3 10*3/uL (ref 0.0–0.7)
Eosinophils Relative: 3.9 % (ref 0.0–5.0)
HCT: 43 % (ref 36.0–46.0)
Hemoglobin: 14.9 g/dL (ref 12.0–15.0)
Lymphocytes Relative: 33.8 % (ref 12.0–46.0)
Lymphs Abs: 3 10*3/uL (ref 0.7–4.0)
MCHC: 34.7 g/dL (ref 30.0–36.0)
MCV: 91.4 fl (ref 78.0–100.0)
Monocytes Absolute: 0.7 10*3/uL (ref 0.1–1.0)
Monocytes Relative: 7.8 % (ref 3.0–12.0)
Neutro Abs: 4.8 10*3/uL (ref 1.4–7.7)
Neutrophils Relative %: 53.7 % (ref 43.0–77.0)
Platelets: 270 10*3/uL (ref 150.0–400.0)
RBC: 4.7 Mil/uL (ref 3.87–5.11)
RDW: 13.9 % (ref 11.5–15.5)
WBC: 9 10*3/uL (ref 4.0–10.5)

## 2020-05-23 LAB — LIPID PANEL
Cholesterol: 171 mg/dL (ref 0–200)
HDL: 50.1 mg/dL (ref >39.00)
LDL Cholesterol: 95 mg/dL (ref 0–99)
NonHDL: 120.92
Total CHOL/HDL Ratio: 3
Triglycerides: 128 mg/dL (ref 0.0–149.0)
VLDL: 25.6 mg/dL (ref 0.0–40.0)

## 2020-05-23 LAB — BASIC METABOLIC PANEL
BUN: 15 mg/dL (ref 6–23)
CO2: 29 mEq/L (ref 19–32)
Calcium: 10 mg/dL (ref 8.4–10.5)
Chloride: 100 mEq/L (ref 96–112)
Creatinine, Ser: 0.8 mg/dL (ref 0.40–1.20)
GFR: 85.8 mL/min (ref >60.00)
Glucose, Bld: 169 mg/dL — ABNORMAL HIGH (ref 70–99)
Potassium: 4.9 mEq/L (ref 3.5–5.1)
Sodium: 138 mEq/L (ref 135–145)

## 2020-05-23 LAB — HEMOGLOBIN A1C: Hgb A1c MFr Bld: 8 % — ABNORMAL HIGH (ref 4.6–6.5)

## 2020-05-23 LAB — TSH: TSH: 2.79 u[IU]/mL (ref 0.35–4.50)

## 2020-05-23 LAB — T4, FREE: Free T4: 0.88 ng/dL (ref 0.60–1.60)

## 2020-05-23 LAB — T3, FREE: T3, Free: 3 pg/mL (ref 2.3–4.2)

## 2020-05-23 MED ORDER — LEVOTHYROXINE SODIUM 75 MCG PO TABS
75.0000 ug | ORAL_TABLET | Freq: Every day | ORAL | 3 refills | Status: DC
Start: 2020-05-23 — End: 2021-03-08

## 2020-05-23 MED ORDER — FLUTICASONE PROPIONATE 50 MCG/ACT NA SUSP: 2.0000 | Freq: Every day | NASAL | 3 refills | Status: DC

## 2020-05-23 MED ORDER — ATORVASTATIN CALCIUM 40 MG PO TABS: 40.0000 mg | ORAL_TABLET | Freq: Every day | ORAL | 3 refills | Status: DC

## 2020-05-23 MED ORDER — METFORMIN HCL 500 MG PO TABS: 500.0000 mg | ORAL_TABLET | Freq: Two times a day (BID) | ORAL | 3 refills | Status: DC

## 2020-05-23 MED ORDER — LEVOCETIRIZINE DIHYDROCHLORIDE 5 MG PO TABS: 5.0000 mg | ORAL_TABLET | Freq: Every evening | ORAL | 3 refills | Status: DC

## 2020-05-23 NOTE — Progress Notes (Signed)
   Subjective:    Patient ID: Zoe [name], female    DOB: [birth year], 50 y.o.   MRN: [id number]  HPI Here for a well exam. She has no concerns.    Review of Systems  Constitutional: Negative.   HENT: Negative.   Eyes: Negative.   Respiratory: Negative.   Cardiovascular: Negative.   Gastrointestinal: Negative.   Genitourinary: Negative for decreased urine volume, difficulty urinating, dyspareunia, dysuria, enuresis, flank pain, frequency, hematuria, pelvic pain and urgency.  Musculoskeletal: Negative.   Skin: Negative.   Neurological: Negative.   Psychiatric/Behavioral: Negative.        Objective:   Physical Exam Constitutional:      General: She is not in acute distress.    Appearance: She is well-developed. She is obese.  HENT:     Head: Normocephalic and atraumatic.     Right Ear: External ear normal.     Left Ear: External ear normal.     Nose: Nose normal.     Mouth/Throat:     Pharynx: No oropharyngeal exudate.  Eyes:     General: No scleral icterus.    Conjunctiva/sclera: Conjunctivae normal.     Pupils: Pupils are equal, round, and reactive to light.  Neck:     Thyroid: No thyromegaly.     Vascular: No JVD.  Cardiovascular:     Rate and Rhythm: Normal rate and regular rhythm.     Heart sounds: Normal heart sounds. No murmur heard. No friction rub. No gallop.   Pulmonary:     Effort: Pulmonary effort is normal. No respiratory distress.     Breath sounds: Normal breath sounds. No wheezing or rales.  Chest:     Chest wall: No tenderness.  Abdominal:     General: Bowel sounds are normal. There is no distension.     Palpations: Abdomen is soft. There is no mass.     Tenderness: There is no abdominal tenderness. There is no guarding or rebound.  Musculoskeletal:        General: No tenderness. Normal range of motion.     Cervical back: Normal range of motion and neck supple.  Lymphadenopathy:     Cervical: No cervical adenopathy.  Skin:    General:  Skin is warm and dry.     Findings: No erythema or rash.  Neurological:     Mental Status: She is alert and oriented to person, place, and time.     Cranial Nerves: No cranial nerve deficit.     Motor: No abnormal muscle tone.     Coordination: Coordination normal.     Deep Tendon Reflexes: Reflexes are normal and symmetric. Reflexes normal.  Psychiatric:        Behavior: Behavior normal.        Thought Content: Thought content normal.        Judgment: Judgment normal.           Assessment & Plan:  Well exam. We discussed diet and exercise. Get fasting labs.  Alysia Penna, MD

## 2020-05-24 ENCOUNTER — Other Ambulatory Visit: Payer: Self-pay

## 2020-05-24 MED ORDER — GLIPIZIDE 10 MG PO TABS: 10.0000 mg | ORAL_TABLET | Freq: Two times a day (BID) | ORAL | 3 refills | Status: DC

## 2020-06-18 ENCOUNTER — Telehealth: Payer: Self-pay | Admitting: Family Medicine

## 2020-06-18 NOTE — Telephone Encounter (Signed)
Last office visit- 05/23/2020  Next office visit- 08/24/20

## 2020-06-18 NOTE — Telephone Encounter (Signed)
Patient would like an order sent to the pharmacy for One Touch Delica glucose meter    [place], [place]. Phone:  [phone]  Fax:  [phone]

## 2020-06-19 NOTE — Telephone Encounter (Signed)
Pt notified on MyChart

## 2020-06-19 NOTE — Telephone Encounter (Signed)
Pt Rx for glucometer was sent to pt pharmacy

## 2020-06-19 NOTE — Telephone Encounter (Signed)
I wrote an RX to be faxed to her pharmacy

## 2020-06-26 ENCOUNTER — Other Ambulatory Visit: Payer: Self-pay | Admitting: Family Medicine

## 2020-06-26 DIAGNOSIS — Z1231 Encounter for screening mammogram for malignant neoplasm of breast: Secondary | ICD-10-CM

## 2020-07-30 ENCOUNTER — Other Ambulatory Visit: Payer: Self-pay | Admitting: Family Medicine

## 2020-08-14 LAB — HM DIABETES EYE EXAM

## 2020-08-16 ENCOUNTER — Encounter: Payer: Self-pay | Admitting: Family Medicine

## 2020-08-17 ENCOUNTER — Other Ambulatory Visit: Payer: Self-pay

## 2020-08-17 ENCOUNTER — Ambulatory Visit
Admission: RE | Admit: 2020-08-17 | Discharge: 2020-08-17 | Disposition: A | Discharge status: Home / Self Care | Payer: BC Managed Care – PPO | Source: Ambulatory Visit | Attending: Family Medicine | Admitting: Family Medicine

## 2020-08-17 DIAGNOSIS — Z1231 Encounter for screening mammogram for malignant neoplasm of breast: Secondary | ICD-10-CM

## 2020-08-24 ENCOUNTER — Ambulatory Visit: Payer: BC Managed Care – PPO | Admitting: Family Medicine

## 2020-08-28 ENCOUNTER — Ambulatory Visit: Payer: BC Managed Care – PPO | Admitting: Family Medicine

## 2020-08-28 ENCOUNTER — Encounter: Payer: Self-pay | Admitting: Family Medicine

## 2020-08-28 ENCOUNTER — Other Ambulatory Visit: Payer: Self-pay

## 2020-08-28 VITALS — BP 110/76 | HR 90 | Temp 98.4°F | Wt 214.0 lb

## 2020-08-28 DIAGNOSIS — I10 Essential (primary) hypertension: Secondary | ICD-10-CM | POA: Diagnosis not present

## 2020-08-28 DIAGNOSIS — E119 Type 2 diabetes mellitus without complications: Secondary | ICD-10-CM | POA: Diagnosis not present

## 2020-08-28 LAB — POCT GLYCOSYLATED HEMOGLOBIN (HGB A1C): Hemoglobin A1C: 5.8 % — AB (ref 4.0–5.6)

## 2020-08-28 NOTE — Progress Notes (Signed)
   Subjective:    Patient ID: Zoe [name], female    DOB: [birth year], 50 y.o.   MRN: [id number]  HPI Here to follow up on diabetes. She had her well exam here on 05-23-20 and her A1c had jumped up to 8.0 that day. We added Glipizide to her Metformin, and she has done well since then. She feels fine and her am fasting glucoses are averaging 110. Her A1c today is 5.8.   Review of Systems  Constitutional: Negative.   Respiratory: Negative.    Cardiovascular: Negative.       Objective:   Physical Exam Constitutional:      Appearance: Normal appearance.  Cardiovascular:     Rate and Rhythm: Normal rate and regular rhythm.     Pulses: Normal pulses.     Heart sounds: Normal heart sounds.  Pulmonary:     Effort: Pulmonary effort is normal.     Breath sounds: Normal breath sounds.  Neurological:     Mental Status: She is alert.          Assessment & Plan:  Her diabetes is now well controlled. She will stay on the current regimen.  Alysia Penna, MD

## 2020-08-31 ENCOUNTER — Ambulatory Visit: Payer: BC Managed Care – PPO | Admitting: Family Medicine

## 2020-10-15 ENCOUNTER — Encounter: Payer: Self-pay | Admitting: Family Medicine

## 2020-10-15 ENCOUNTER — Other Ambulatory Visit: Payer: Self-pay

## 2020-10-15 ENCOUNTER — Ambulatory Visit: Payer: BC Managed Care – PPO | Admitting: Family Medicine

## 2020-10-15 VITALS — BP 110/76 | HR 100 | Temp 98.1°F | Wt 218.0 lb

## 2020-10-15 DIAGNOSIS — K047 Periapical abscess without sinus: Secondary | ICD-10-CM | POA: Diagnosis not present

## 2020-10-15 MED ORDER — CLINDAMYCIN HCL 300 MG PO CAPS
300.0000 mg | ORAL_CAPSULE | Freq: Three times a day (TID) | ORAL | 0 refills | Status: DC
Start: 2020-10-15 — End: 2020-12-07

## 2020-10-15 NOTE — Progress Notes (Signed)
   Subjective:    Patient ID: Zoe [name], female    DOB: [birth year], 50 y.o.   MRN: [id number]  HPI Here for another bout of pain in the abscessed tooth in the left lower jaw. We gave her a course of Clindamycin for this in March, and this kept things under control until a few days ago. She wants to wait to have this addressed by her dentist after January 1st so she can her FSA funds to cover it. No fever.    Review of Systems  Constitutional: Negative.   HENT:  Positive for dental problem.   Respiratory: Negative.    Cardiovascular: Negative.       Objective:   Physical Exam Constitutional:      Appearance: Normal appearance. She is not ill-appearing.  HENT:     Mouth/Throat:     Comments: There is a partially broken off molar in the left lower jaw Lymphadenopathy:     Cervical: No cervical adenopathy.  Neurological:     Mental Status: She is alert.          Assessment & Plan:  Dental abscess. We will treat with 10 days of Clindamycin. Recheck as needed.  Alysia Penna, MD

## 2020-10-31 ENCOUNTER — Other Ambulatory Visit: Payer: Self-pay | Admitting: Family Medicine

## 2020-11-16 ENCOUNTER — Other Ambulatory Visit: Payer: Self-pay

## 2020-11-16 ENCOUNTER — Telehealth: Payer: Self-pay | Admitting: Family Medicine

## 2020-11-16 MED ORDER — DOXYCYCLINE HYCLATE 100 MG PO TABS: 100.0000 mg | ORAL_TABLET | Freq: Two times a day (BID) | ORAL | 0 refills | Status: DC

## 2020-11-16 NOTE — Telephone Encounter (Signed)
Patient was seen on 10/15/20 for dental abcess.

## 2020-11-16 NOTE — Telephone Encounter (Signed)
Call in Doxycycline 100 mg BID for 14 days

## 2020-11-16 NOTE — Telephone Encounter (Signed)
Pt Rx sent to her pharmacy, Pt notified

## 2020-11-16 NOTE — Telephone Encounter (Signed)
Patient stated that she have a abscess and its causing her face to swell. She is wondering if she could get prescribed something for it.  Patient could be contacted at [phone].  Please advise.

## 2020-12-07 ENCOUNTER — Telehealth: Payer: Self-pay

## 2020-12-07 MED ORDER — TRAMADOL HCL 50 MG PO TABS: 100.0000 mg | ORAL_TABLET | Freq: Three times a day (TID) | ORAL | 0 refills | Status: DC | PRN

## 2020-12-07 MED ORDER — CLINDAMYCIN HCL 300 MG PO CAPS: 300.0000 mg | ORAL_CAPSULE | Freq: Three times a day (TID) | ORAL | 2 refills | Status: DC

## 2020-12-07 NOTE — Telephone Encounter (Addendum)
Patient was seen on 10/15/20 for dental abscess. Please advise

## 2020-12-07 NOTE — Telephone Encounter (Signed)
Called patient to inform medication has been sent to [facility] on [address]

## 2020-12-07 NOTE — Telephone Encounter (Signed)
Patient called stating that she has a tooth   abscess that has flared up and wants to know if a Rx can be sent to pharmacy patient would also like an Rx for pain.

## 2020-12-07 NOTE — Telephone Encounter (Signed)
I sent in Clindamycin and Tramadol

## 2020-12-07 NOTE — Addendum Note (Signed)
Addended by: Alysia Penna A on: 12/07/2020 01:07 PM   Modules accepted: Orders

## 2020-12-10 ENCOUNTER — Other Ambulatory Visit: Payer: Self-pay | Admitting: Family Medicine

## 2020-12-26 ENCOUNTER — Telehealth: Payer: Self-pay

## 2020-12-26 NOTE — Telephone Encounter (Signed)
Spoke with pt advised that her Tramadol Rx was approved by her insurance

## 2020-12-26 NOTE — Telephone Encounter (Signed)
Approvedon October 20 Your PA request has been approved. Additional information will be provided in the approval communication. (Message 1145) Drug traMADol HCl 50MG  tablets Form [employer] Electronic PA Form [phone] NCPDP)

## 2021-02-03 ENCOUNTER — Other Ambulatory Visit: Payer: Self-pay | Admitting: Family Medicine

## 2021-02-07 ENCOUNTER — Encounter: Payer: Self-pay | Admitting: Family Medicine

## 2021-02-07 DIAGNOSIS — M545 Low back pain, unspecified: Secondary | ICD-10-CM

## 2021-02-11 NOTE — Telephone Encounter (Signed)
I did the referral 

## 2021-02-11 NOTE — Telephone Encounter (Signed)
I could do a referral. Ask her what part of her back is hurting

## 2021-02-21 NOTE — Progress Notes (Signed)
[place] Phone: [phone] Subjective:   Zoe [name], am serving as a scribe for Dr. Hulan [name]. This visit occurred during the SARS-CoV-2 public health emergency.  Safety protocols were in place, including screening questions prior to the visit, additional usage of staff PPE, and extensive cleaning of exam room while observing appropriate contact time as indicated for disinfecting solutions.   I'm seeing this patient by the request  of:  Zoe Morale, MD  CC: Low back pain  IFO:[id number]  Zoe [name] is a 50 y.o. female coming in with complaint of low back pain. Patient states that she has had pain for years on both sides of lower back. Patient is taking care of mother and thus her back pain has increased. Denies any radiating symptoms. Patient uses muscle relaxer's which do help. Uses Flexeril and Tramdol for pain relief. Has not done any PT.       Past Medical History:  Diagnosis Date   Allergy    Anemia    Benign hematuria    worked up by Dr. Kathie [name]   Chickenpox    Horseshoe kidney    Hx of migraine headaches    Hyperlipidemia    Hypertension    Menorrhagia    resolved with ablation   Osteoarthritis    patient denies this dx   SVD (spontaneous vaginal delivery)    x 1   Thyroid disease    Past Surgical History:  Procedure Laterality Date   CESAREAN SECTION     x 2   CHOLECYSTECTOMY     COLONOSCOPY  11/16/2017   per Dr. Hilarie [name], sigmoid diverticulosis, [name] polyps, repeat in 5 yrs    SHOULDER SURGERY Right    arthroscopy   TUBAL LIGATION     TYMPANOSTOMY     Social History   Socioeconomic History   Marital status: Divorced    Spouse name: Not on file   Number of children: 3   Years of education: 12+   Highest education level: Not on file  Occupational History    Employer: [employer]  Tobacco Use   Smoking status: Never   Smokeless tobacco: Never   Vaping Use   Vaping Use: Never used  Substance and Sexual Activity   Alcohol use: [name]    Alcohol/week: 0.0 standard drinks   Drug use: [name]   Sexual activity: Yes    Birth control/protection: None    Comment: ablation  Other Topics Concern   Not on file  Social History Narrative   Patient is married.     Patient has 3 children.    Patient lives with parents.    Patient is employed with [employer] and [employer]    Patient has some college.    Social Determinants of Health   Financial Resource Strain: Not on file  Food Insecurity: Not on file  Transportation Needs: Not on file  Physical Activity: Not on file  Stress: Not on file  Social Connections: Not on file   Allergies  Allergen Reactions   Penicillins Hives   Family History  Problem Relation Age of Onset   Colon polyps Brother    Arthritis Other    Coronary artery disease Other    Depression Other    Diabetes Other    Hyperlipidemia Other    Sudden death Other    Colon cancer Neg Hx    Rectal cancer Neg Hx  Stomach cancer Neg Hx     Current Outpatient Medications (Endocrine & Metabolic):    glipiZIDE (GLUCOTROL) 10 MG tablet, Take 1 tablet (10 mg total) by mouth 2 (two) times daily before a meal.   levothyroxine (SYNTHROID) 75 MCG tablet, Take 1 tablet (75 mcg total) by mouth daily before breakfast.   metFORMIN (GLUCOPHAGE) 500 MG tablet, Take 1 tablet (500 mg total) by mouth 2 (two) times daily with a meal.   Current Outpatient Medications (Cardiovascular):    atorvastatin (LIPITOR) 40 MG tablet, Take 1 tablet (40 mg total) by mouth at bedtime. **Please call the office to schedule lab appointment for future refills   lisinopril (ZESTRIL) 10 MG tablet, Take 1 tablet by mouth once daily   Current Outpatient Medications (Respiratory):    albuterol (PROAIR HFA) 108 (90 Base) MCG/ACT inhaler, Inhale 2 puffs into the lungs every 6 (six) hours as needed for wheezing or shortness of breath.   fluticasone (FLONASE) 50 MCG/ACT  nasal spray, Place 2 sprays into both nostrils daily.   levocetirizine (XYZAL) 5 MG tablet, Take 1 tablet (5 mg total) by mouth every evening.   montelukast (SINGULAIR) 10 MG tablet, TAKE 1 TABLET BY MOUTH AT BEDTIME   Current Outpatient Medications (Analgesics):    traMADol (ULTRAM) 50 MG tablet, Take 2 tablets (100 mg total) by mouth every 8 (eight) hours as needed.     Current Outpatient Medications (Other):    ALPRAZolam (XANAX) 1 MG tablet, Take 1 tablet by mouth twice daily as needed   clindamycin (CLEOCIN) 300 MG capsule, Take 1 capsule (300 mg total) by mouth 3 (three) times daily.   cyclobenzaprine (FLEXERIL) 10 MG tablet, Take 1 tablet (10 mg total) by mouth 3 (three) times daily as needed for muscle spasms.   doxycycline (VIBRA-TABS) 100 MG tablet, Take 1 tablet (100 mg total) by mouth 2 (two) times daily.   ketoconazole (NIZORAL) 2 % cream, Apply 1 application topically as needed.   nortriptyline (PAMELOR) 50 MG capsule, Take 1 capsule by mouth at bedtime  Current Facility-Administered Medications (Other):    0.9 %  sodium chloride infusion   Reviewed prior external information including notes and imaging from  primary care provider As well as notes that were available from care everywhere and other healthcare systems.  Past medical history, social, surgical and family history all reviewed in electronic medical record.  [name] pertanent information unless stated regarding to the chief complaint.   Review of Systems:  [name] headache, visual changes, nausea, vomiting, diarrhea, constipation, dizziness, abdominal pain, skin rash, fevers, chills, night sweats, weight loss, swollen lymph nodes, body aches, joint swelling, chest pain, shortness of breath, mood changes. POSITIVE muscle aches  Objective  Blood pressure 110/78, pulse (!) 109, height 4\' 11"  (1.499 m), weight 220 lb (99.8 kg), SpO2 98 %.   General: [name] apparent distress alert and oriented x3 mood and affect normal,  dressed appropriately.  HEENT: Pupils equal, extraocular movements intact  Respiratory: Patient's speak in full sentences and does not appear short of breath  Cardiovascular: [name] lower extremity edema, non tender, [name] erythema  Gait normal with good balance and coordination.  MSK: Low back exam does have some loss of lordosis.  Patient does have some mild tightness noted with FABER test.  Patient does have short legs compared to patient's Negative straight leg test.  5-5 strength in lower extremities  Osteopathic findings C4 flexed rotated and side bent left T6 extended rotated and side bent left inhaled rib L2  flexed rotated and side bent right L4 flexed rotated and side bent left Sacrum right on right    Impression and Recommendations:     The above documentation has been reviewed and is accurate and complete Lyndal Pulley, DO

## 2021-02-26 ENCOUNTER — Encounter: Payer: Self-pay | Admitting: Family Medicine

## 2021-02-26 ENCOUNTER — Ambulatory Visit: Payer: BC Managed Care – PPO | Admitting: Family Medicine

## 2021-02-26 ENCOUNTER — Ambulatory Visit (INDEPENDENT_AMBULATORY_CARE_PROVIDER_SITE_OTHER): Payer: BC Managed Care – PPO

## 2021-02-26 ENCOUNTER — Other Ambulatory Visit: Payer: Self-pay

## 2021-02-26 VITALS — BP 110/78 | HR 109 | Ht 59.0 in | Wt 220.0 lb

## 2021-02-26 DIAGNOSIS — G8929 Other chronic pain: Secondary | ICD-10-CM

## 2021-02-26 DIAGNOSIS — M545 Low back pain, unspecified: Secondary | ICD-10-CM

## 2021-02-26 DIAGNOSIS — M9904 Segmental and somatic dysfunction of sacral region: Secondary | ICD-10-CM

## 2021-02-26 DIAGNOSIS — M9908 Segmental and somatic dysfunction of rib cage: Secondary | ICD-10-CM

## 2021-02-26 DIAGNOSIS — M9902 Segmental and somatic dysfunction of thoracic region: Secondary | ICD-10-CM

## 2021-02-26 DIAGNOSIS — M9901 Segmental and somatic dysfunction of cervical region: Secondary | ICD-10-CM

## 2021-02-26 DIAGNOSIS — M9903 Segmental and somatic dysfunction of lumbar region: Secondary | ICD-10-CM | POA: Insufficient documentation

## 2021-02-26 NOTE — Assessment & Plan Note (Signed)
   Decision today to treat with OMT was based on Physical Exam  After verbal consent patient was treated with HVLA, ME, FPR techniques in cervical, thoracic,  lumbar and sacral areas, all areas are chronic   Patient tolerated the procedure well with improvement in symptoms  Patient given exercises, stretches and lifestyle modifications  See medications in patient instructions if given  Patient will follow up in 4-8 weeks 

## 2021-02-26 NOTE — Patient Instructions (Signed)
Xray today Exercises Turmeric 500mg  daily  Tart cherry extract 1200mg  at night Vitamin D 2000 IU daily  See me again in 5-6 weeks

## 2021-02-26 NOTE — Assessment & Plan Note (Signed)
Patient is having low back pain.  Seems to be more right greater than left.  Significant tightness of the hip flexors noted.  Given exercises today with athletic trainer.  I am optimistic the patient will make significant progress.  Discussed with activities.  Avoid.  Increase activity slowly.  Follow-up again in 6 to 8 weeks.

## 2021-03-03 ENCOUNTER — Other Ambulatory Visit: Payer: Self-pay | Admitting: Family Medicine

## 2021-03-08 ENCOUNTER — Other Ambulatory Visit: Payer: Self-pay

## 2021-03-08 MED ORDER — LEVOTHYROXINE SODIUM 75 MCG PO TABS: 75.0000 ug | ORAL_TABLET | Freq: Every day | ORAL | 3 refills | Status: DC

## 2021-04-02 ENCOUNTER — Other Ambulatory Visit: Payer: Self-pay | Admitting: Family Medicine

## 2021-04-04 NOTE — Progress Notes (Signed)
[name] Phone: [phone] Subjective:   Fontaine No, am serving as a scribe for Dr. Hulan Saas. This visit occurred during the SARS-CoV-2 public health emergency.  Safety protocols were in place, including screening questions prior to the visit, additional usage of staff PPE, and extensive cleaning of exam room while observing appropriate contact time as indicated for disinfecting solutions.    I'm seeing this patient by the request  of:  Laurey Morale, MD  CC: Back and neck pain follow-up.  [id number]  [name] is a 51 y.o. female coming in with complaint of back and neck pain. OMT 02/26/2021. Patient states that she has been having back spasms for past week on R lumbar spine. No pattern to movements that increase her pain. Has been using mm relaxers which are helping.   Medications patient has been prescribed: None  Taking:         Reviewed prior external information including notes and imaging from previsou exam, outside providers and external EMR if available.   As well as notes that were available from care everywhere and other healthcare systems.  Past medical history, social, surgical and family history all reviewed in electronic medical record.  No pertanent information unless stated regarding to the chief complaint.   Past Medical History:  Diagnosis Date   Allergy    Anemia    Benign hematuria    worked up by Dr. Kathie Rhodes   Chickenpox    Horseshoe kidney    Hx of migraine headaches    Hyperlipidemia    Hypertension    Menorrhagia    resolved with ablation   Osteoarthritis    patient denies this dx   SVD (spontaneous vaginal delivery)    x 1   Thyroid disease     Allergies  Allergen Reactions   Penicillins Hives     Review of Systems:  No headache, visual changes, nausea, vomiting, diarrhea, constipation, dizziness, abdominal pain, skin rash, fevers,  chills, night sweats, weight loss, swollen lymph nodes, body aches, joint swelling, chest pain, shortness of breath, mood changes. POSITIVE muscle aches  Objective  Blood pressure 98/74, pulse 100, height 4\' 11"  (1.499 m), weight 220 lb (99.8 kg), SpO2 98 %.   General: No apparent distress alert and oriented x3 mood and affect normal, dressed appropriately.  HEENT: Pupils equal, extraocular movements intact  Respiratory: Patient's speak in full sentences and does not appear short of breath  Cardiovascular: No lower extremity edema, non tender, no erythema  Low back exam does have some loss of lordosis.  Poor core strength still noted.  Patient does have tenderness to palpation in the paraspinal musculature.  Osteopathic findings  C2 flexed rotated and side bent right C6 flexed rotated and side bent right T6 extended rotated and side bent right inhaled rib L2 flexed rotated and side bent right Sacrum right on right       Assessment and Plan:  Low back pain Low back and suggested some loss of lordosis.  Some tenderness to palpation but overall significant progress.  We discussed core strengthening.  Increase activity slowly.  Follow-up again in 6 weeks   Nonallopathic problems  Decision today to treat with OMT was based on Physical Exam  After verbal consent patient was treated with HVLA, ME, FPR techniques in cervical, rib, thoracic, lumbar, and sacral  areas  Patient tolerated the procedure well with improvement in symptoms  Patient  given exercises, stretches and lifestyle modifications  See medications in patient instructions if given  Patient will follow up in 4-8 weeks      The above documentation has been reviewed and is accurate and complete Lyndal Pulley, DO       Note: This dictation was prepared with Dragon dictation along with smaller phrase technology. Any transcriptional errors that result from this process are unintentional.

## 2021-04-05 ENCOUNTER — Ambulatory Visit: Payer: BC Managed Care – PPO | Admitting: Family Medicine

## 2021-04-05 ENCOUNTER — Other Ambulatory Visit: Payer: Self-pay

## 2021-04-05 VITALS — BP 98/74 | HR 100 | Ht 59.0 in | Wt 220.0 lb

## 2021-04-05 DIAGNOSIS — M9901 Segmental and somatic dysfunction of cervical region: Secondary | ICD-10-CM

## 2021-04-05 DIAGNOSIS — M9902 Segmental and somatic dysfunction of thoracic region: Secondary | ICD-10-CM

## 2021-04-05 DIAGNOSIS — M9908 Segmental and somatic dysfunction of rib cage: Secondary | ICD-10-CM | POA: Diagnosis not present

## 2021-04-05 DIAGNOSIS — M9903 Segmental and somatic dysfunction of lumbar region: Secondary | ICD-10-CM | POA: Diagnosis not present

## 2021-04-05 DIAGNOSIS — G8929 Other chronic pain: Secondary | ICD-10-CM | POA: Diagnosis not present

## 2021-04-05 DIAGNOSIS — M9904 Segmental and somatic dysfunction of sacral region: Secondary | ICD-10-CM

## 2021-04-05 DIAGNOSIS — M545 Low back pain, unspecified: Secondary | ICD-10-CM

## 2021-04-05 NOTE — Assessment & Plan Note (Signed)
Low back and suggested some loss of lordosis.  Some tenderness to palpation but overall significant progress.  We discussed core strengthening.  Increase activity slowly.  Follow-up again in 6 weeks

## 2021-04-05 NOTE — Patient Instructions (Signed)
Good to see you! Everything is looking fabulous No real changes See you again in 6 week

## 2021-05-01 ENCOUNTER — Other Ambulatory Visit: Payer: Self-pay | Admitting: Family Medicine

## 2021-05-03 ENCOUNTER — Other Ambulatory Visit: Payer: Self-pay | Admitting: Family Medicine

## 2021-05-16 NOTE — Progress Notes (Signed)
?Charlann Boxer D.O. ?[facility] ?[place] ?Phone: [phone] ?Subjective:   ?I, Zoe [name], am serving as a Education administrator for Dr. Hulan Saas. ?This visit occurred during the SARS-CoV-2 public health emergency.  Safety protocols were in place, including screening questions prior to the visit, additional usage of staff PPE, and extensive cleaning of exam room while observing appropriate contact time as indicated for disinfecting solutions.  ? ?I'm seeing this patient by the request  of:  Laurey Morale, MD ? ?CC: neck pain low back pain  ? ?[id number]  ?Zoe [name] is a 51 y.o. female coming in with complaint of back and neck pain. OMT 04/05/2021. Patient states same per usual. No new complaints. ? ?Medications patient has been prescribed: None ? ?Taking: ? ? ?  ? ? ? ? ?Reviewed prior external information including notes and imaging from previsou exam, outside providers and external EMR if available.  ? ?As well as notes that were available from care everywhere and other healthcare systems. ? ?Past medical history, social, surgical and family history all reviewed in electronic medical record.  No pertanent information unless stated regarding to the chief complaint.  ? ?Past Medical History:  ?Diagnosis Date  ? Allergy   ? Anemia   ? Benign hematuria   ? worked up by Dr. Kathie Rhodes  ? Chickenpox   ? Horseshoe kidney   ? Hx of migraine headaches   ? Hyperlipidemia   ? Hypertension   ? Menorrhagia   ? resolved with ablation  ? Osteoarthritis   ? patient denies this dx  ? SVD (spontaneous vaginal delivery)   ? x 1  ? Thyroid disease   ?  ?Allergies  ?Allergen Reactions  ? Penicillins Hives  ? ? ? ?Review of Systems: ? No headache, visual changes, nausea, vomiting, diarrhea, constipation, dizziness, abdominal pain, skin rash, fevers, chills, night sweats, weight loss, swollen lymph nodes, body aches, joint swelling, chest pain, shortness of breath, mood changes. POSITIVE  muscle aches ? ?Objective  ?Blood pressure 118/72, pulse (!) 102, height '4\' 11"'$  (1.499 m), weight 218 lb (98.9 kg), SpO2 99 %. ?  ?General: No apparent distress alert and oriented x3 mood and affect normal, dressed appropriately.  ?HEENT: Pupils equal, extraocular movements intact  ?Respiratory: Patient's speak in full sentences and does not appear short of breath  ?Cardiovascular: No lower extremity edema, non tender, no erythema  ?Gait normal with good balance and coordination.  ?MSK:  Non tender with full range of motion and good stability and symmetric strength and tone of shoulders, elbows, wrist, hip, knee and ankles bilaterally.  ?Back exam does have significant loss of lordosis.  Patient does have tenderness to palpation in the right sacral area.  Patient's neck exam does have more discomfort noted on the right side as well more in the C4-C5 area.  Still tightness noted in the thoracic no radicular symptoms with 5 out of 5 strength of all the extremities. ? ?Osteopathic findings ? ?C6 flexed rotated and side bent right ?T3 extended rotated and side bent right inhaled rib ?T9 extended rotated and side bent left ?L2 flexed rotated and side bent right ?Sacrum right on right ? ? ?  ?Assessment and Plan: ? ?Low back pain ?Patient still does have some weakness in the shoulder.  Discussed with patient icing regimen and home exercises, discussed which activities to do and which ones to avoid, increase activity slowly.  Patient has responded well to osteopathic  manipulation over the course of time.  Patient does have Flexeril for any breakthrough pain.  Follow-up with me again 6 to 8 weeks ?  ? ?Nonallopathic problems ? ?Decision today to treat with OMT was based on Physical Exam ? ?After verbal consent patient was treated with HVLA, ME, FPR techniques in cervical, rib, thoracic, lumbar, and sacral  areas ? ?Patient tolerated the procedure well with improvement in symptoms ? ?Patient given exercises, stretches and  lifestyle modifications ? ?See medications in patient instructions if given ? ?Patient will follow up in 4-8 weeks ? ?  ? ? ?The above documentation has been reviewed and is accurate and complete Lyndal Pulley, DO ? ? ? ?  ? ? Note: This dictation was prepared with Dragon dictation along with smaller phrase technology. Any transcriptional errors that result from this process are unintentional.    ?  ?  ? ?

## 2021-05-17 ENCOUNTER — Ambulatory Visit: Payer: BC Managed Care – PPO | Admitting: Family Medicine

## 2021-05-17 ENCOUNTER — Other Ambulatory Visit: Payer: Self-pay

## 2021-05-17 VITALS — BP 118/72 | HR 102 | Ht 59.0 in | Wt 218.0 lb

## 2021-05-17 DIAGNOSIS — M545 Low back pain, unspecified: Secondary | ICD-10-CM

## 2021-05-17 DIAGNOSIS — M9908 Segmental and somatic dysfunction of rib cage: Secondary | ICD-10-CM | POA: Diagnosis not present

## 2021-05-17 DIAGNOSIS — M9903 Segmental and somatic dysfunction of lumbar region: Secondary | ICD-10-CM

## 2021-05-17 DIAGNOSIS — M9901 Segmental and somatic dysfunction of cervical region: Secondary | ICD-10-CM

## 2021-05-17 DIAGNOSIS — G8929 Other chronic pain: Secondary | ICD-10-CM | POA: Diagnosis not present

## 2021-05-17 DIAGNOSIS — M9904 Segmental and somatic dysfunction of sacral region: Secondary | ICD-10-CM

## 2021-05-17 DIAGNOSIS — M9902 Segmental and somatic dysfunction of thoracic region: Secondary | ICD-10-CM | POA: Diagnosis not present

## 2021-05-17 NOTE — Patient Instructions (Signed)
Good to see you! ?Make sure you take time for yourself  ?See you again in 7-8 weeks ?

## 2021-05-17 NOTE — Assessment & Plan Note (Signed)
Patient still does have some weakness in the shoulder.  Discussed with patient icing regimen and home exercises, discussed which activities to do and which ones to avoid, increase activity slowly.  Patient has responded well to osteopathic manipulation over the course of time.  Patient does have Flexeril for any breakthrough pain.  Follow-up with me again 6 to 8 weeks ?

## 2021-06-24 ENCOUNTER — Other Ambulatory Visit: Payer: Self-pay | Admitting: Family Medicine

## 2021-06-25 ENCOUNTER — Other Ambulatory Visit: Payer: Self-pay | Admitting: Family Medicine

## 2021-06-25 DIAGNOSIS — E038 Other specified hypothyroidism: Secondary | ICD-10-CM

## 2021-06-25 DIAGNOSIS — E785 Hyperlipidemia, unspecified: Secondary | ICD-10-CM

## 2021-07-04 ENCOUNTER — Other Ambulatory Visit: Payer: Self-pay | Admitting: Family Medicine

## 2021-07-04 DIAGNOSIS — Z1231 Encounter for screening mammogram for malignant neoplasm of breast: Secondary | ICD-10-CM

## 2021-07-05 NOTE — Progress Notes (Signed)
?Zoe [name] D.O. ?[name] ?[name] ?Phone: [phone] ?Subjective:   ?I, Zoe [name], am serving as a scribe for Dr. Hulan Saas. ? ?This visit occurred during the SARS-CoV-2 public health emergency.  Safety protocols were in place, including screening questions prior to the visit, additional usage of staff PPE, and extensive cleaning of exam room while observing appropriate contact time as indicated for disinfecting solutions.  ? ? ?I'm seeing this patient by the request  of:  Laurey Morale, MD ? ?CC: Neck and back pain follow-up ? ?[id number]  ?Zoe [name] is a 51 y.o. female coming in with complaint of back and neck pain. OMT 05/17/2021. Patient states that she was doing well until 2 days ago. Pain increased as she was helping take care of her mom.  ? ?Medications patient has been prescribed: none ? ?Taking: ? ? ?  ? ? ? ? ?Reviewed prior external information including notes and imaging from previsou exam, outside providers and external EMR if available.  ? ?As well as notes that were available from care everywhere and other healthcare systems. ? ?Past medical history, social, surgical and family history all reviewed in electronic medical record.  No pertanent information unless stated regarding to the chief complaint.  ? ?Past Medical History:  ?Diagnosis Date  ? Allergy   ? Anemia   ? Benign hematuria   ? worked up by Dr. Kathie Rhodes  ? Chickenpox   ? Horseshoe kidney   ? Hx of migraine headaches   ? Hyperlipidemia   ? Hypertension   ? Menorrhagia   ? resolved with ablation  ? Osteoarthritis   ? patient denies this dx  ? SVD (spontaneous vaginal delivery)   ? x 1  ? Thyroid disease   ?  ?Allergies  ?Allergen Reactions  ? Penicillins Hives  ? ? ? ?Review of Systems: ? No headache, visual changes, nausea, vomiting, diarrhea, constipation, dizziness, abdominal pain, skin rash, fevers, chills, night sweats, weight loss, swollen lymph nodes, body aches,  joint swelling, chest pain, shortness of breath, mood changes. POSITIVE muscle aches ? ?Objective  ?Blood pressure 124/86, pulse 99, height '4\' 11"'$  (1.499 m), weight 218 lb (98.9 kg), SpO2 97 %. ?  ?General: No apparent distress alert and oriented x3 mood and affect normal, dressed appropriately.  ?HEENT: Pupils equal, extraocular movements intact  ?Respiratory: Patient's speak in full sentences and does not appear short of breath  ?Cardiovascular: No lower extremity edema, non tender, no erythema  ?Neck exam does have some mild loss of lordosis.  Patient does have significant loss of lordosis of the lumbar spine.  Tightness with FABER test bilaterally.  Patient does have tightness with straight leg test as well. ? ?Osteopathic findings ? ?C2 flexed rotated and side bent right ?C6 flexed rotated and side bent left ?T3 extended rotated and side bent right inhaled rib ?T8 extended rotated and side bent left ?L2 flexed rotated and side bent right ?Sacrum right on right ? ? ? ? ?  ?Assessment and Plan: ? ? ?Low back pain ?Chronic, with mild exacerbation.  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  Patient does have the muscle relaxer to use for breakthrough pain.  Discussed icing regimen and home exercises, discussed which activities to do which ones to avoid, increase activity slowly.  Follow-up again in 6 to 8 weeks  ? ? ?Nonallopathic problems ? ?Decision today to  treat with OMT was based on Physical Exam ? ?After verbal consent patient was treated with HVLA, ME, FPR techniques in cervical, rib, thoracic, lumbar, and sacral  areas ? ?Patient tolerated the procedure well with improvement in symptoms ? ?Patient given exercises, stretches and lifestyle modifications ? ?See medications in patient instructions if given ? ?Patient will follow up in 4-8 weeks ? ?  ? ? ?The above documentation has been reviewed and is accurate and complete Lyndal Pulley, DO ? ? ? ?  ? ?  Note: This dictation was prepared with Dragon dictation along with smaller phrase technology. Any transcriptional errors that result from this process are unintentional.    ?  ?  ? ?

## 2021-07-09 ENCOUNTER — Encounter: Payer: BC Managed Care – PPO | Admitting: Family Medicine

## 2021-07-09 ENCOUNTER — Ambulatory Visit: Payer: BC Managed Care – PPO | Admitting: Family Medicine

## 2021-07-09 ENCOUNTER — Encounter: Payer: Self-pay | Admitting: Family Medicine

## 2021-07-09 ENCOUNTER — Ambulatory Visit (INDEPENDENT_AMBULATORY_CARE_PROVIDER_SITE_OTHER): Payer: BC Managed Care – PPO | Admitting: Family Medicine

## 2021-07-09 VITALS — BP 124/86 | HR 99 | Ht 59.0 in | Wt 218.0 lb

## 2021-07-09 VITALS — BP 102/78 | HR 92 | Temp 97.8°F | Ht 59.0 in | Wt 216.0 lb

## 2021-07-09 DIAGNOSIS — E119 Type 2 diabetes mellitus without complications: Secondary | ICD-10-CM | POA: Diagnosis not present

## 2021-07-09 DIAGNOSIS — M9904 Segmental and somatic dysfunction of sacral region: Secondary | ICD-10-CM

## 2021-07-09 DIAGNOSIS — M9901 Segmental and somatic dysfunction of cervical region: Secondary | ICD-10-CM | POA: Diagnosis not present

## 2021-07-09 DIAGNOSIS — M9903 Segmental and somatic dysfunction of lumbar region: Secondary | ICD-10-CM

## 2021-07-09 DIAGNOSIS — M9902 Segmental and somatic dysfunction of thoracic region: Secondary | ICD-10-CM

## 2021-07-09 DIAGNOSIS — E785 Hyperlipidemia, unspecified: Secondary | ICD-10-CM | POA: Diagnosis not present

## 2021-07-09 DIAGNOSIS — G8929 Other chronic pain: Secondary | ICD-10-CM | POA: Diagnosis not present

## 2021-07-09 DIAGNOSIS — M9908 Segmental and somatic dysfunction of rib cage: Secondary | ICD-10-CM

## 2021-07-09 DIAGNOSIS — M545 Low back pain, unspecified: Secondary | ICD-10-CM | POA: Diagnosis not present

## 2021-07-09 DIAGNOSIS — E038 Other specified hypothyroidism: Secondary | ICD-10-CM

## 2021-07-09 DIAGNOSIS — Z0000 Encounter for general adult medical examination without abnormal findings: Secondary | ICD-10-CM | POA: Diagnosis not present

## 2021-07-09 LAB — HEMOGLOBIN A1C: Hgb A1c MFr Bld: 6.2 % (ref 4.6–6.5)

## 2021-07-09 LAB — LIPID PANEL
Cholesterol: 154 mg/dL (ref 0–200)
HDL: 48.8 mg/dL
LDL Cholesterol: 80 mg/dL (ref 0–99)
NonHDL: 105.47
Total CHOL/HDL Ratio: 3
Triglycerides: 128 mg/dL (ref 0.0–149.0)
VLDL: 25.6 mg/dL (ref 0.0–40.0)

## 2021-07-09 LAB — BASIC METABOLIC PANEL WITH GFR
BUN: 12 mg/dL (ref 6–23)
CO2: 29 meq/L (ref 19–32)
Calcium: 10.6 mg/dL — ABNORMAL HIGH (ref 8.4–10.5)
Chloride: 101 meq/L (ref 96–112)
Creatinine, Ser: 0.84 mg/dL (ref 0.40–1.20)
GFR: 80.28 mL/min
Glucose, Bld: 114 mg/dL — ABNORMAL HIGH (ref 70–99)
Potassium: 4.8 meq/L (ref 3.5–5.1)
Sodium: 139 meq/L (ref 135–145)

## 2021-07-09 LAB — T3, FREE: T3, Free: 2.7 pg/mL (ref 2.3–4.2)

## 2021-07-09 LAB — T4, FREE: Free T4: 0.73 ng/dL (ref 0.60–1.60)

## 2021-07-09 LAB — CBC WITH DIFFERENTIAL/PLATELET
Basophils Absolute: 0.1 10*3/uL (ref 0.0–0.1)
Basophils Relative: 0.7 % (ref 0.0–3.0)
Eosinophils Absolute: 0.2 10*3/uL (ref 0.0–0.7)
Eosinophils Relative: 2 % (ref 0.0–5.0)
HCT: 45.2 % (ref 36.0–46.0)
Hemoglobin: 15.2 g/dL — ABNORMAL HIGH (ref 12.0–15.0)
Lymphocytes Relative: 32.5 % (ref 12.0–46.0)
Lymphs Abs: 3.7 10*3/uL (ref 0.7–4.0)
MCHC: 33.6 g/dL (ref 30.0–36.0)
MCV: 91.9 fl (ref 78.0–100.0)
Monocytes Absolute: 0.9 10*3/uL (ref 0.1–1.0)
Monocytes Relative: 7.6 % (ref 3.0–12.0)
Neutro Abs: 6.5 10*3/uL (ref 1.4–7.7)
Neutrophils Relative %: 57.2 % (ref 43.0–77.0)
Platelets: 336 10*3/uL (ref 150.0–400.0)
RBC: 4.92 Mil/uL (ref 3.87–5.11)
RDW: 14.3 % (ref 11.5–15.5)
WBC: 11.3 10*3/uL — ABNORMAL HIGH (ref 4.0–10.5)

## 2021-07-09 LAB — TSH: TSH: 3.5 u[IU]/mL (ref 0.35–5.50)

## 2021-07-09 LAB — HEPATIC FUNCTION PANEL
ALT: 38 U/L — ABNORMAL HIGH (ref 0–35)
AST: 28 U/L (ref 0–37)
Albumin: 4.5 g/dL (ref 3.5–5.2)
Alkaline Phosphatase: 86 U/L (ref 39–117)
Bilirubin, Direct: 0.1 mg/dL (ref 0.0–0.3)
Total Bilirubin: 0.5 mg/dL (ref 0.2–1.2)
Total Protein: 8.2 g/dL (ref 6.0–8.3)

## 2021-07-09 MED ORDER — METFORMIN HCL 500 MG PO TABS: 500.0000 mg | ORAL_TABLET | Freq: Two times a day (BID) | ORAL | 3 refills | Status: DC

## 2021-07-09 MED ORDER — GLIPIZIDE 10 MG PO TABS: 10.0000 mg | ORAL_TABLET | Freq: Two times a day (BID) | ORAL | 3 refills | Status: DC

## 2021-07-09 MED ORDER — LEVOTHYROXINE SODIUM 75 MCG PO TABS: 75.0000 ug | ORAL_TABLET | Freq: Every day | ORAL | 3 refills | Status: DC

## 2021-07-09 MED ORDER — NORTRIPTYLINE HCL 50 MG PO CAPS: 50.0000 mg | ORAL_CAPSULE | Freq: Every day | ORAL | 3 refills | Status: DC

## 2021-07-09 MED ORDER — LISINOPRIL 10 MG PO TABS: 10.0000 mg | ORAL_TABLET | Freq: Every day | ORAL | 3 refills | Status: DC

## 2021-07-09 MED ORDER — ATORVASTATIN CALCIUM 40 MG PO TABS: 40.0000 mg | ORAL_TABLET | Freq: Every day | ORAL | 3 refills | Status: DC

## 2021-07-09 NOTE — Progress Notes (Signed)
? ?  Subjective:  ? ? Patient ID: Zoe [name], female    DOB: [birth year], 51 y.o.   MRN: [id number] ? ?HPI ?Here for a well exam. She has no concerns. Her am fasting gluoses have been averaging 110-120.  ? ? ?Review of Systems  ?Constitutional: Negative.   ?HENT: Negative.    ?Eyes: Negative.   ?Respiratory: Negative.    ?Cardiovascular: Negative.   ?Gastrointestinal: Negative.   ?Genitourinary:  Negative for decreased urine volume, difficulty urinating, dyspareunia, dysuria, enuresis, flank pain, frequency, hematuria, pelvic pain and urgency.  ?Musculoskeletal: Negative.   ?Skin: Negative.   ?Neurological: Negative.  Negative for headaches.  ?Psychiatric/Behavioral: Negative.    ? ?   ?Objective:  ? Physical Exam ?Constitutional:   ?   General: She is not in acute distress. ?   Appearance: She is well-developed. She is obese.  ?HENT:  ?   Head: Normocephalic and atraumatic.  ?   Right Ear: External ear normal.  ?   Left Ear: External ear normal.  ?   Nose: Nose normal.  ?   Mouth/Throat:  ?   Pharynx: No oropharyngeal exudate.  ?Eyes:  ?   General: No scleral icterus. ?   Conjunctiva/sclera: Conjunctivae normal.  ?   Pupils: Pupils are equal, round, and reactive to light.  ?Neck:  ?   Thyroid: No thyromegaly.  ?   Vascular: No JVD.  ?Cardiovascular:  ?   Rate and Rhythm: Normal rate and regular rhythm.  ?   Heart sounds: Normal heart sounds. No murmur heard. ?  No friction rub. No gallop.  ?Pulmonary:  ?   Effort: Pulmonary effort is normal. No respiratory distress.  ?   Breath sounds: Normal breath sounds. No wheezing or rales.  ?Chest:  ?   Chest wall: No tenderness.  ?Abdominal:  ?   General: Bowel sounds are normal. There is no distension.  ?   Palpations: Abdomen is soft. There is no mass.  ?   Tenderness: There is no abdominal tenderness. There is no guarding or rebound.  ?Musculoskeletal:     ?   General: No tenderness. Normal range of motion.  ?   Cervical back: Normal range of motion and neck supple.   ?Lymphadenopathy:  ?   Cervical: No cervical adenopathy.  ?Skin: ?   General: Skin is warm and dry.  ?   Findings: No erythema or rash.  ?Neurological:  ?   Mental Status: She is alert and oriented to person, place, and time.  ?   Cranial Nerves: No cranial nerve deficit.  ?   Motor: No abnormal muscle tone.  ?   Coordination: Coordination normal.  ?   Deep Tendon Reflexes: Reflexes are normal and symmetric. Reflexes normal.  ?Psychiatric:     ?   Behavior: Behavior normal.     ?   Thought Content: Thought content normal.     ?   Judgment: Judgment normal.  ? ? ? ? ? ?   ?Assessment & Plan:  ?Well exam. We discussed diet and exercise. Get fasting labs.  ?Alysia Penna, MD ? ? ?

## 2021-07-09 NOTE — Assessment & Plan Note (Signed)
Chronic, with mild exacerbation.  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  Patient does have the muscle relaxer to use for breakthrough pain.  Discussed icing regimen and home exercises, discussed which activities to do which ones to avoid, increase activity slowly.  Follow-up again in 6 to 8 weeks ?

## 2021-07-09 NOTE — Patient Instructions (Signed)
Good to see you ?Keep being grasshoppers good luck charm ?See me in 6-8 weeks ?

## 2021-07-17 ENCOUNTER — Other Ambulatory Visit: Payer: Self-pay | Admitting: Family Medicine

## 2021-08-19 ENCOUNTER — Ambulatory Visit
Admission: RE | Admit: 2021-08-19 | Discharge: 2021-08-19 | Disposition: A | Discharge status: Home / Self Care | Payer: BC Managed Care – PPO | Source: Ambulatory Visit | Attending: Family Medicine | Admitting: Family Medicine

## 2021-08-19 DIAGNOSIS — Z1231 Encounter for screening mammogram for malignant neoplasm of breast: Secondary | ICD-10-CM

## 2021-08-20 ENCOUNTER — Other Ambulatory Visit: Payer: Self-pay | Admitting: Family Medicine

## 2021-08-22 NOTE — Progress Notes (Signed)
Zoe [name] [address] [place] Phone: [phone] Subjective:   Zoe [name], am serving as a scribe for Dr. Hulan Saas.  I'm seeing this patient by the request  of:  Laurey Morale, MD  CC: Back and neck pain follow-up  [id number]  Zoe [name] is a 51 y.o. female coming in with complaint of back and neck pain. OMT 07/09/2021. Patient states same per usual. No new complaints.  Patient has been doing relatively well overall though.  Just tightness.  Nothing that stopping her from activity.  Taking care of her 24-monthold granddaughter.  Medications patient has been prescribed: None  Taking:         Reviewed prior external information including notes and imaging from previsou exam, outside providers and external EMR if available.   As well as notes that were available from care everywhere and other healthcare systems.  Past medical history, social, surgical and family history all reviewed in electronic medical record.  No pertanent information unless stated regarding to the chief complaint.   Past Medical History:  Diagnosis Date   Allergy    Anemia    Benign hematuria    worked up by Dr. MKathie Rhodes  Chickenpox    Horseshoe kidney    Hx of migraine headaches    Hyperlipidemia    Hypertension    Menorrhagia    resolved with ablation   Osteoarthritis    patient denies this dx   SVD (spontaneous vaginal delivery)    x 1   Thyroid disease     Allergies  Allergen Reactions   Penicillins Hives     Review of Systems:  No headache, visual changes, nausea, vomiting, diarrhea, constipation, dizziness, abdominal pain, skin rash, fevers, chills, night sweats, weight loss, swollen lymph nodes, body aches, joint swelling, chest pain, shortness of breath, mood changes. POSITIVE muscle aches  Objective  Blood pressure 106/70, pulse (!) 106, height '4\' 11"'$  (1.499 m), weight 219 lb (99.3 kg), SpO2 95 %.    General: No apparent distress alert and oriented x3 mood and affect normal, dressed appropriately.  HEENT: Pupils equal, extraocular movements intact  Respiratory: Patient's speak in full sentences and does not appear short of breath  Cardiovascular: No lower extremity edema, non tender, no erythema  MSK:  Back low back exam does have some mild loss of lordosis.  Does have some hip abductor weakness.  Patient does have tenderness to palpation in the paraspinal musculature.  Osteopathic findings  C2 flexed rotated and side bent right C7 flexed rotated and side bent left T4 extended rotated and side bent right inhaled rib T9 extended rotated and side bent left L1 flexed rotated and side bent right Sacrum right on right     Assessment and Plan:  Low back pain Low back exam does have some loss of lordosis.  Patient though does need to watch her posture and ergonomics while she is taking care of the younger child.  Discussed icing regimen and home exercises otherwise.  Discussed which activities to do which ones to avoid.  Increase activity slowly.  Follow-up again in 6 to 8 weeks    Nonallopathic problems  Decision today to treat with OMT was based on Physical Exam  After verbal consent patient was treated with HVLA, ME, FPR techniques in cervical, rib, thoracic, lumbar, and sacral  areas  Patient tolerated the procedure well with improvement in symptoms  Patient given exercises, stretches and lifestyle  modifications  See medications in patient instructions if given  Patient will follow up in 4-8 weeks    The above documentation has been reviewed and is accurate and complete Lyndal Pulley, DO          Note: This dictation was prepared with Dragon dictation along with smaller phrase technology. Any transcriptional errors that result from this process are unintentional.

## 2021-08-26 ENCOUNTER — Telehealth: Payer: Self-pay | Admitting: Family Medicine

## 2021-08-26 NOTE — Telephone Encounter (Signed)
Pharmacy does not have levothyroxine (SYNTHROID) 75 MCG tablet  And needs the okay from MD for substitute  Please advise.  [place], [place]. Phone:  [phone]  Fax:  [phone]      Pt's number:  [phone]

## 2021-08-27 ENCOUNTER — Other Ambulatory Visit: Payer: Self-pay | Admitting: Family Medicine

## 2021-08-28 ENCOUNTER — Other Ambulatory Visit: Payer: Self-pay

## 2021-08-28 MED ORDER — LEVOTHYROXINE SODIUM 25 MCG PO TABS: ORAL_TABLET | ORAL | 11 refills | Status: DC

## 2021-08-28 NOTE — Telephone Encounter (Signed)
Spoke with pt state that she picked up Rx for Levothyroxine 75 mg from her pharmacy yesterday. Substitute Rx for 25 mg 3 tablets daily was cancelled

## 2021-08-28 NOTE — Telephone Encounter (Signed)
Call in Levothyroxine 25 mg to take 3 pills daily, #90 with 11 rf

## 2021-09-03 ENCOUNTER — Ambulatory Visit: Payer: BC Managed Care – PPO | Admitting: Family Medicine

## 2021-09-03 VITALS — BP 106/70 | HR 106 | Ht 59.0 in | Wt 219.0 lb

## 2021-09-03 DIAGNOSIS — M9901 Segmental and somatic dysfunction of cervical region: Secondary | ICD-10-CM

## 2021-09-03 DIAGNOSIS — M9903 Segmental and somatic dysfunction of lumbar region: Secondary | ICD-10-CM

## 2021-09-03 DIAGNOSIS — M545 Low back pain, unspecified: Secondary | ICD-10-CM

## 2021-09-03 DIAGNOSIS — M9902 Segmental and somatic dysfunction of thoracic region: Secondary | ICD-10-CM

## 2021-09-03 DIAGNOSIS — M9908 Segmental and somatic dysfunction of rib cage: Secondary | ICD-10-CM

## 2021-09-03 DIAGNOSIS — M9904 Segmental and somatic dysfunction of sacral region: Secondary | ICD-10-CM

## 2021-09-03 DIAGNOSIS — G8929 Other chronic pain: Secondary | ICD-10-CM | POA: Diagnosis not present

## 2021-09-03 NOTE — Assessment & Plan Note (Signed)
Low back exam does have some loss of lordosis.  Patient though does need to watch her posture and ergonomics while she is taking care of the younger child.  Discussed icing regimen and home exercises otherwise.  Discussed which activities to do which ones to avoid.  Increase activity slowly.  Follow-up again in 6 to 8 weeks

## 2021-09-03 NOTE — Patient Instructions (Signed)
Great to see you Enjoy grandbaby See me in 6-8 weeks

## 2021-10-10 NOTE — Progress Notes (Unsigned)
  Zoe [name] [address] [place] Phone: [phone] Subjective:   Zoe [name], am serving as a scribe for Dr. Hulan Saas.  I'm seeing this patient by the request  of:  Laurey Morale, MD  CC: neck and back pain   [id number]  Zoe [name] is a 51 y.o. female coming in with complaint of back and neck pain. OMT on 09/03/2021. Patient states overall has been doing relatively well.  Some mild increase in stress recently.  Does notice the tightness in the neck and the lower back.  Medications patient has been prescribed: None  Taking:          Past Medical History:  Diagnosis Date   Allergy    Anemia    Benign hematuria    worked up by Dr. Kathie Rhodes   Chickenpox    Horseshoe kidney    Hx of migraine headaches    Hyperlipidemia    Hypertension    Menorrhagia    resolved with ablation   Osteoarthritis    patient denies this dx   SVD (spontaneous vaginal delivery)    x 1   Thyroid disease     Allergies  Allergen Reactions   Penicillins Hives     Review of Systems:  No headache, visual changes, nausea, vomiting, diarrhea, constipation, dizziness, abdominal pain, skin rash, fevers, chills, night sweats, weight loss, swollen lymph nodes, body aches, joint swelling, chest pain, shortness of breath, mood changes. POSITIVE muscle aches  Objective  Blood pressure 116/76, pulse (!) 115, height '4\' 11"'$  (1.499 m), weight 218 lb (98.9 kg), SpO2 94 %.   General: No apparent distress alert and oriented x3 mood and affect normal, dressed appropriately.  HEENT: Pupils equal, extraocular movements intact  Respiratory: Patient's speak in full sentences and does not appear short of breath  Cardiovascular: No lower extremity edema, non tender, no erythema  Gait MSK:  Back tightness noted in the paraspinal musculature.  Patient does have some weakness of the core strength noted.  Tightness with FABER test right greater  than left.  Osteopathic findings  C2 flexed rotated and side bent right C6 flexed rotated and side bent left T3 extended rotated and side bent right inhaled rib T9 extended rotated and side bent left L2 flexed rotated and side bent right Sacrum right on right       Assessment and Plan:  Low back pain Chronic problem with stable overall.  Responding well to osteopathic manipulation.  Discussed still core strengthening.  Continue to monitor weight.  Does have certain other health issues such as iron deficiency that we will continue to monitor as well.  Follow-up again in 6 to 8 weeks.    Nonallopathic problems  Decision today to treat with OMT was based on Physical Exam  After verbal consent patient was treated with HVLA, ME, FPR techniques in cervical, rib, thoracic, lumbar, and sacral  areas  Patient tolerated the procedure well with improvement in symptoms  Patient given exercises, stretches and lifestyle modifications  See medications in patient instructions if given  Patient will follow up in 4-8 weeks       The above documentation has been reviewed and is accurate and complete Zoe Pulley, DO       Note: This dictation was prepared with Dragon dictation along with smaller phrase technology. Any transcriptional errors that result from this process are unintentional.

## 2021-10-12 ENCOUNTER — Other Ambulatory Visit: Payer: Self-pay | Admitting: Family Medicine

## 2021-10-15 ENCOUNTER — Ambulatory Visit: Payer: BC Managed Care – PPO | Admitting: Family Medicine

## 2021-10-15 VITALS — BP 116/76 | HR 115 | Ht 59.0 in | Wt 218.0 lb

## 2021-10-15 DIAGNOSIS — M9901 Segmental and somatic dysfunction of cervical region: Secondary | ICD-10-CM

## 2021-10-15 DIAGNOSIS — M9904 Segmental and somatic dysfunction of sacral region: Secondary | ICD-10-CM | POA: Diagnosis not present

## 2021-10-15 DIAGNOSIS — G8929 Other chronic pain: Secondary | ICD-10-CM | POA: Diagnosis not present

## 2021-10-15 DIAGNOSIS — M9908 Segmental and somatic dysfunction of rib cage: Secondary | ICD-10-CM | POA: Diagnosis not present

## 2021-10-15 DIAGNOSIS — M9903 Segmental and somatic dysfunction of lumbar region: Secondary | ICD-10-CM

## 2021-10-15 DIAGNOSIS — M545 Low back pain, unspecified: Secondary | ICD-10-CM

## 2021-10-15 DIAGNOSIS — M9902 Segmental and somatic dysfunction of thoracic region: Secondary | ICD-10-CM

## 2021-10-15 NOTE — Assessment & Plan Note (Signed)
Chronic problem with stable overall.  Responding well to osteopathic manipulation.  Discussed still core strengthening.  Continue to monitor weight.  Does have certain other health issues such as iron deficiency that we will continue to monitor as well.  Follow-up again in 6 to 8 weeks.

## 2021-10-15 NOTE — Patient Instructions (Addendum)
HAPPY BIRTHDAY Spoil yourself rotten Keep working on exercises

## 2021-11-13 ENCOUNTER — Ambulatory Visit (INDEPENDENT_AMBULATORY_CARE_PROVIDER_SITE_OTHER): Payer: BC Managed Care – PPO | Admitting: Family Medicine

## 2021-11-13 VITALS — BP 112/72 | HR 114 | Ht 59.0 in | Wt 219.0 lb

## 2021-11-13 DIAGNOSIS — G8929 Other chronic pain: Secondary | ICD-10-CM | POA: Diagnosis not present

## 2021-11-13 DIAGNOSIS — M9908 Segmental and somatic dysfunction of rib cage: Secondary | ICD-10-CM | POA: Diagnosis not present

## 2021-11-13 DIAGNOSIS — M545 Low back pain, unspecified: Secondary | ICD-10-CM

## 2021-11-13 DIAGNOSIS — M9903 Segmental and somatic dysfunction of lumbar region: Secondary | ICD-10-CM

## 2021-11-13 DIAGNOSIS — M9904 Segmental and somatic dysfunction of sacral region: Secondary | ICD-10-CM | POA: Diagnosis not present

## 2021-11-13 DIAGNOSIS — M9901 Segmental and somatic dysfunction of cervical region: Secondary | ICD-10-CM

## 2021-11-13 DIAGNOSIS — M9902 Segmental and somatic dysfunction of thoracic region: Secondary | ICD-10-CM

## 2021-11-13 NOTE — Assessment & Plan Note (Signed)
More difficulty with patient having the low back pain.  Patient does have the tightness in the neck noted today as well.  Responded well though to osteopathic manipulation.  Patient does have a horseshoe kidney and would not tell her to take ibuprofen only 1 time tonight secondary to the tightness in the musculature of the neck after this.  Discussed icing regimen and home exercises.  Increase activity slowly otherwise.  Follow-up with me again in 6 to 8 weeks.

## 2021-11-13 NOTE — Patient Instructions (Signed)
Great to see you I think neck was a fluke 3 IBU tonight and ice before bed for 20 min See me in 6-8 weeks

## 2021-11-13 NOTE — Progress Notes (Signed)
[place] Phone: [phone] Subjective:   Zoe [name], am serving as a scribe for Dr. Hulan Saas.  I'm seeing this patient by the request  of:  Laurey Morale, MD  CC: back and neck pain   [id number]  Zoe [name] is a 51 y.o. female coming in with complaint of back and neck pain.  OMT 10/15/2021. Patient states that she is unable to turn her head towards the R for past week. Headache for past week as well. Using IBU and excedrin. Denies any radiating symptoms.   Medications patient has been prescribed: None        Reviewed prior external information including notes and imaging from previsou exam, outside providers and external EMR if available.   As well as notes that were available from care everywhere and other healthcare systems.  Past medical history, social, surgical and family history all reviewed in electronic medical record.  [name] pertanent information unless stated regarding to the chief complaint.   Past Medical History:  Diagnosis Date   Allergy    Anemia    Benign hematuria    worked up by Dr. Kathie Rhodes   Chickenpox    Horseshoe kidney    Hx of migraine headaches    Hyperlipidemia    Hypertension    Menorrhagia    resolved with ablation   Osteoarthritis    patient denies this dx   SVD (spontaneous vaginal delivery)    x 1   Thyroid disease     Allergies  Allergen Reactions   Penicillins Hives     Review of Systems:  [name] headache, visual changes, nausea, vomiting, diarrhea, constipation, dizziness, abdominal pain, skin rash, fevers, chills, night sweats, weight loss, swollen lymph nodes, body aches, joint swelling, chest pain, shortness of breath, mood changes. POSITIVE muscle aches  Objective  Blood pressure 112/72, pulse (!) 114, height '4\' 11"'$  (1.499 m), weight 219 lb (99.3 kg), SpO2 97 %.   General: [name] apparent distress alert and oriented x3 mood and affect  normal, dressed appropriately.  HEENT: Pupils equal, extraocular movements intact  Respiratory: Patient's speak in full sentences and does not appear short of breath  Cardiovascular: [name] lower extremity edema, non tender, [name] erythema  Gait MSK:  Back   Osteopathic findings  C3 flexed rotated and side bent right C5 flexed rotated and side bent right  T3 extended rotated and side bent right inhaled rib T8 extended rotated and side bent right  L2 flexed rotated and side bent right Sacrum right on right     Assessment and Plan:  Low back pain More difficulty with patient having the low back pain.  Patient does have the tightness in the neck noted today as well.  Responded well though to osteopathic manipulation.  Patient does have a horseshoe kidney and would not tell her to take ibuprofen only 1 time tonight secondary to the tightness in the musculature of the neck after this.  Discussed icing regimen and home exercises.  Increase activity slowly otherwise.  Follow-up with me again in 6 to 8 weeks.    Nonallopathic problems  Decision today to treat with OMT was based on Physical Exam  After verbal consent patient was treated with HVLA, ME, FPR techniques in cervical, rib, thoracic, lumbar, and sacral  areas  Patient tolerated the procedure well with improvement in symptoms  Patient given exercises, stretches and lifestyle modifications  See medications in patient  instructions if given  Patient will follow up in 6-8 weeks     The above documentation has been reviewed and is accurate and complete Lyndal Pulley, DO         Note: This dictation was prepared with Dragon dictation along with smaller phrase technology. Any transcriptional errors that result from this process are unintentional.

## 2021-12-13 NOTE — Progress Notes (Unsigned)
Zoe [name] [address] [place] Phone: [phone] Subjective:   Zoe [name], am serving as a scribe for Dr. Hulan Saas.  I'm seeing this patient by the request  of:  Laurey Morale, MD  CC: Back and neck pain follow-up  [id number]  Zoe [name] is a 51 y.o. female coming in with complaint of back and neck pain. OMT on 11/13/2021. Patient states doing well.  Has had some discomfort recently.  Has noticed little things have been more tight than usual.  Sometimes affecting daily activities.  Medications patient has been prescribed: None  Taking:         Reviewed prior external information including notes and imaging from previsou exam, outside providers and external EMR if available.   As well as notes that were available from care everywhere and other healthcare systems.  Past medical history, social, surgical and family history all reviewed in electronic medical record.  No pertanent information unless stated regarding to the chief complaint.   Past Medical History:  Diagnosis Date   Allergy    Anemia    Benign hematuria    worked up by Dr. Kathie Rhodes   Chickenpox    Horseshoe kidney    Hx of migraine headaches    Hyperlipidemia    Hypertension    Menorrhagia    resolved with ablation   Osteoarthritis    patient denies this dx   SVD (spontaneous vaginal delivery)    x 1   Thyroid disease     Allergies  Allergen Reactions   Penicillins Hives     Review of Systems:  No headache, visual changes, nausea, vomiting, diarrhea, constipation, dizziness, abdominal pain, skin rash, fevers, chills, night sweats, weight loss, swollen lymph nodes, body aches, joint swelling, chest pain, shortness of breath, mood changes. POSITIVE muscle aches  Objective  Blood pressure 110/70, pulse 96, height '4\' 11"'$  (1.499 m), weight 219 lb (99.3 kg), SpO2 99 %.   General: No apparent distress alert and oriented x3 mood  and affect normal, dressed appropriately.  HEENT: Pupils equal, extraocular movements intact  Respiratory: Patient's speak in full sentences and does not appear short of breath  Cardiovascular: No lower extremity edema, non tender, no erythema  Back exam does have some loss of lordosis.  Neck exam no does have significant loss of lordosis.  Tenderness to palpation in the paraspinal musculature.  Tightness with Corky Sox right greater than left.  Osteopathic findings  C2 flexed rotated and side bent right C5 flexed rotated and side bent left T3 extended rotated and side bent right inhaled rib T9 extended rotated and side bent left L2 flexed rotated and side bent right Sacrum right on right       Assessment and Plan:  Low back pain Tightness noted patient does have some limited Faber bilaterally.  Continue to work on posture and core.  Still needs weight weight loss.  Has muscle relaxers such as the Flexeril 10 mg up to 3 times a day as needed.  Discussed the nortriptyline 50 mg daily at nighttime.  Patient does have tramadol for breakthrough pain as well prescribed I think by primary care.  Follow-up with me again in 6 to 8 weeks    Nonallopathic problems  Decision today to treat with OMT was based on Physical Exam  After verbal consent patient was treated with HVLA, ME, FPR techniques in cervical, rib, thoracic, lumbar, and sacral  areas  Patient tolerated  the procedure well with improvement in symptoms  Patient given exercises, stretches and lifestyle modifications  See medications in patient instructions if given  Patient will follow up in 4-8 weeks     The above documentation has been reviewed and is accurate and complete Lyndal Pulley, DO         Note: This dictation was prepared with Dragon dictation along with smaller phrase technology. Any transcriptional errors that result from this process are unintentional.

## 2021-12-17 ENCOUNTER — Encounter: Payer: Self-pay | Admitting: Family Medicine

## 2021-12-17 ENCOUNTER — Ambulatory Visit: Payer: BC Managed Care – PPO | Admitting: Family Medicine

## 2021-12-17 VITALS — BP 110/70 | HR 96 | Ht 59.0 in | Wt 219.0 lb

## 2021-12-17 DIAGNOSIS — M9904 Segmental and somatic dysfunction of sacral region: Secondary | ICD-10-CM | POA: Diagnosis not present

## 2021-12-17 DIAGNOSIS — G8929 Other chronic pain: Secondary | ICD-10-CM

## 2021-12-17 DIAGNOSIS — M9901 Segmental and somatic dysfunction of cervical region: Secondary | ICD-10-CM | POA: Diagnosis not present

## 2021-12-17 DIAGNOSIS — M9903 Segmental and somatic dysfunction of lumbar region: Secondary | ICD-10-CM

## 2021-12-17 DIAGNOSIS — M9908 Segmental and somatic dysfunction of rib cage: Secondary | ICD-10-CM | POA: Diagnosis not present

## 2021-12-17 DIAGNOSIS — M545 Low back pain, unspecified: Secondary | ICD-10-CM | POA: Diagnosis not present

## 2021-12-17 DIAGNOSIS — M9902 Segmental and somatic dysfunction of thoracic region: Secondary | ICD-10-CM

## 2021-12-17 NOTE — Assessment & Plan Note (Signed)
Tightness noted patient does have some limited Faber bilaterally.  Continue to work on posture and core.  Still needs weight weight loss.  Has muscle relaxers such as the Flexeril 10 mg up to 3 times a day as needed.  Discussed the nortriptyline 50 mg daily at nighttime.  Patient does have tramadol for breakthrough pain as well prescribed I think by primary care.  Follow-up with me again in 6 to 8 weeks

## 2021-12-17 NOTE — Patient Instructions (Signed)
Good to see you! Tell those kids to stop hurting your neck See you again in 7-8 weeks

## 2021-12-23 ENCOUNTER — Encounter: Payer: Self-pay | Admitting: Family Medicine

## 2021-12-25 ENCOUNTER — Ambulatory Visit: Payer: BC Managed Care – PPO | Admitting: Family Medicine

## 2021-12-25 ENCOUNTER — Encounter: Payer: Self-pay | Admitting: Family Medicine

## 2021-12-25 VITALS — BP 110/70 | HR 105 | Temp 98.2°F | Ht 59.0 in | Wt 216.0 lb

## 2021-12-25 DIAGNOSIS — M19041 Primary osteoarthritis, right hand: Secondary | ICD-10-CM | POA: Diagnosis not present

## 2021-12-25 DIAGNOSIS — M19042 Primary osteoarthritis, left hand: Secondary | ICD-10-CM

## 2021-12-25 MED ORDER — DICLOFENAC SODIUM 1 % EX GEL: 4.0000 g | Freq: Four times a day (QID) | CUTANEOUS | Status: AC

## 2021-12-25 NOTE — Progress Notes (Signed)
   Subjective:    Patient ID: Zoe [name], female    DOB: [birth year], 51 y.o.   MRN: [id number]  HPI Here for help with arthritis pains in her hands. This is the worst area of her body for this. She takes Ibuprofen and she wears battery heated gloves when she is outside.    Review of Systems  Constitutional: Negative.   Respiratory: Negative.    Cardiovascular: Negative.   Musculoskeletal:  Positive for arthralgias.       Objective:   Physical Exam Constitutional:      Appearance: Normal appearance.  Cardiovascular:     Rate and Rhythm: Normal rate and regular rhythm.     Pulses: Normal pulses.     Heart sounds: Normal heart sounds.  Pulmonary:     Effort: Pulmonary effort is normal.     Breath sounds: Normal breath sounds.  Musculoskeletal:     Comments: Her hands appear normal. She is tender over the PIP's and DIP's of all the fingers   Neurological:     Mental Status: She is alert.           Assessment & Plan:  OA. In addition to Ibuprofen, she can apply Voltaren gel to the hands every 6 hours as needed. Recheck as needed.  Alysia Penna, MD

## 2022-01-03 ENCOUNTER — Other Ambulatory Visit: Payer: Self-pay | Admitting: Family Medicine

## 2022-01-08 ENCOUNTER — Ambulatory Visit: Payer: Self-pay

## 2022-01-08 ENCOUNTER — Other Ambulatory Visit: Payer: Self-pay | Admitting: Family Medicine

## 2022-01-08 ENCOUNTER — Ambulatory Visit: Payer: BC Managed Care – PPO

## 2022-01-08 ENCOUNTER — Encounter: Payer: Self-pay | Admitting: Family Medicine

## 2022-01-08 ENCOUNTER — Ambulatory Visit (INDEPENDENT_AMBULATORY_CARE_PROVIDER_SITE_OTHER): Payer: BC Managed Care – PPO | Admitting: Family Medicine

## 2022-01-08 VITALS — BP 108/72 | HR 107 | Ht 59.0 in | Wt 218.0 lb

## 2022-01-08 DIAGNOSIS — S63093A Other subluxation of unspecified wrist and hand, initial encounter: Secondary | ICD-10-CM | POA: Diagnosis not present

## 2022-01-08 DIAGNOSIS — S63093D Other subluxation of unspecified wrist and hand, subsequent encounter: Secondary | ICD-10-CM | POA: Insufficient documentation

## 2022-01-08 DIAGNOSIS — M25531 Pain in right wrist: Secondary | ICD-10-CM

## 2022-01-08 NOTE — Progress Notes (Addendum)
[place] Phone: [phone] Subjective:   Fontaine No, am serving as a scribe for Dr. Hulan Saas.  I'm seeing this patient by the request  of:  Laurey Morale, MD  CC: Right wrist pain  [id number]  Zoe [name] is a 50 y.o. female coming in with complaint of R wrist pain. Last seen on 10/28 for OMT. Patient states that her pain began on Saturday while cooking dinner. Pain over distal ulna. Has been splint with braces. Painful to use hand and at rest. Feels weakness in wrist.        Past Medical History:  Diagnosis Date   Allergy    Anemia    Benign hematuria    worked up by Dr. Kathie Rhodes   Chickenpox    Horseshoe kidney    Hx of migraine headaches    Hyperlipidemia    Hypertension    Menorrhagia    resolved with ablation   Osteoarthritis    patient denies this dx   SVD (spontaneous vaginal delivery)    x 1   Thyroid disease    Past Surgical History:  Procedure Laterality Date   CESAREAN SECTION     x 2   CHOLECYSTECTOMY     COLONOSCOPY  11/16/2017   per Dr. Hilarie Fredrickson, sigmoid diverticulosis, no polyps, repeat in 5 yrs    SHOULDER SURGERY Right    arthroscopy   TUBAL LIGATION     TYMPANOSTOMY     Social History   Socioeconomic History   Marital status: Divorced    Spouse name: Not on file   Number of children: 3   Years of education: 12+   Highest education level: Not on file  Occupational History    Employer: [employer]  Tobacco Use   Smoking status: Never   Smokeless tobacco: Never  Vaping Use   Vaping Use: Never used  Substance and Sexual Activity   Alcohol use: No    Alcohol/week: 0.0 standard drinks of alcohol   Drug use: No   Sexual activity: Yes    Birth control/protection: None    Comment: ablation  Other Topics Concern   Not on file  Social History Narrative   Patient is married.     Patient has 3 children.    Patient lives with  parents.    Patient is employed with [employer] and [employer]    Patient has some college.    Social Determinants of Health   Financial Resource Strain: Not on file  Food Insecurity: Not on file  Transportation Needs: Not on file  Physical Activity: Not on file  Stress: Not on file  Social Connections: Not on file   Allergies  Allergen Reactions   Penicillins Hives   Family History  Problem Relation Age of Onset   Colon polyps Brother    Arthritis Other    Coronary artery disease Other    Depression Other    Diabetes Other    Hyperlipidemia Other    Sudden death Other    Colon cancer Neg Hx    Rectal cancer Neg Hx    Stomach cancer Neg Hx     Current Outpatient Medications (Endocrine & Metabolic):    glipiZIDE (GLUCOTROL) 10 MG tablet, Take 1 tablet (10 mg total) by mouth 2 (two) times daily before a meal.   levothyroxine (SYNTHROID) 25 MCG tablet, Take 3 tablets (75 mg ) by mouth daily  metFORMIN (GLUCOPHAGE) 500 MG tablet, Take 1 tablet (500 mg total) by mouth 2 (two) times daily with a meal.   Current Outpatient Medications (Cardiovascular):    atorvastatin (LIPITOR) 40 MG tablet, Take 1 tablet (40 mg total) by mouth daily.   lisinopril (ZESTRIL) 10 MG tablet, Take 1 tablet (10 mg total) by mouth daily.   Current Outpatient Medications (Respiratory):    albuterol (PROAIR HFA) 108 (90 Base) MCG/ACT inhaler, Inhale 2 puffs into the lungs every 6 (six) hours as needed for wheezing or shortness of breath.   fluticasone (FLONASE) 50 MCG/ACT nasal spray, Place 2 sprays into both nostrils daily.   levocetirizine (XYZAL) 5 MG tablet, TAKE 1 TABLET BY MOUTH ONCE DAILY IN THE EVENING   montelukast (SINGULAIR) 10 MG tablet, TAKE 1 TABLET BY MOUTH AT BEDTIME   Current Outpatient Medications (Analgesics):    traMADol (ULTRAM) 50 MG tablet, Take 2 tablets (100 mg total) by mouth every 8 (eight) hours as needed.     Current Outpatient Medications (Other):    Cholecalciferol (VITAMIN  D3 PO), Take by mouth.   cyclobenzaprine (FLEXERIL) 10 MG tablet, Take 1 tablet (10 mg total) by mouth 3 (three) times daily as needed for muscle spasms.   diclofenac Sodium (VOLTAREN) 1 % GEL, Apply 4 g topically 4 (four) times daily.   ketoconazole (NIZORAL) 2 % cream, Apply 1 application topically as needed.   Lancets (ONETOUCH DELICA PLUS [id number]) MISC, USE 1  TO CHECK GLUCOSE ONCE DAILY   nortriptyline (PAMELOR) 50 MG capsule, Take 1 capsule (50 mg total) by mouth at bedtime.   ONETOUCH VERIO test strip, USE 1 STRIP TO CHECK GLUCOSE ONCE DAILY   TART CHERRY PO, Take by mouth.   Turmeric (QC TUMERIC COMPLEX) 500 MG CAPS, Take by mouth.  Current Facility-Administered Medications (Other):    0.9 %  sodium chloride infusion   Reviewed prior external information including notes and imaging from  primary care provider As well as notes that were available from care everywhere and other healthcare systems.  Past medical history, social, surgical and family history all reviewed in electronic medical record.  No pertanent information unless stated regarding to the chief complaint.   Review of Systems:  No headache, visual changes, nausea, vomiting, diarrhea, constipation, dizziness, abdominal pain, skin rash, fevers, chills, night sweats, weight loss, swollen lymph nodes, body aches, joint swelling, chest pain, shortness of breath, mood changes. POSITIVE muscle aches  Objective  Blood pressure 108/72, pulse (!) 107, height '4\' 11"'$  (1.499 m), weight 218 lb (98.9 kg), SpO2 98 %.   General: No apparent distress alert and oriented x3 mood and affect normal, dressed appropriately.  HEENT: Pupils equal, extraocular movements intact  Respiratory: Patient's speak in full sentences and does not appear short of breath  Cardiovascular: No lower extremity edema, non tender, no erythema  Right wrist pain noted minorly.  The right  wrist seems to be significantly worse with swelling over the ECU does  have worsening pain with ulnar deviation.  Good grip strength noted.  Limited muscular skeletal ultrasound was performed and interpreted by Hulan Saas, M  Limited ultrasound of patient's left wrist shows significant hypoechoic changes around the ECU.  No increasing in neovascularization or Doppler flow noted.  Patient does have what appears to be some mild subluxation noted as well. Impression: ECU tendinitis with subluxation.  Procedure: Real-time Ultrasound Guided Injection of right  ECU tendon sheath Device: GE Logiq Q7 Ultrasound guided injection is preferred based studies that  show increased duration, increased effect, greater accuracy, decreased procedural pain, increased response rate, and decreased cost with ultrasound guided versus blind injection.  Verbal informed consent obtained.  Time-out conducted.  Noted no overlying erythema, induration, or other signs of local infection.  Skin prepped in a sterile fashion.  Local anesthesia: Topical Ethyl chloride.  With sterile technique and under real time ultrasound guidance: With a 25-gauge 1 inch needle injected with 0.5 cc of 0.5% Marcaine and 0.5 cc of Kenalog 40 mg/mL Completed without difficulty  Pain immediately resolved suggesting accurate placement of the medication.  Advised to call if fevers/chills, erythema, induration, drainage, or persistent bleeding.  Impression: Technically successful ultrasound guided injection.    Impression and Recommendations:     The above documentation has been reviewed and is accurate and complete Lyndal Pulley, DO

## 2022-01-08 NOTE — Patient Instructions (Signed)
Good to see you  Injection in wrist today  ECU exercises given  Use coban wrap it finger space above bony prominence Follow up as scheduled

## 2022-01-08 NOTE — Assessment & Plan Note (Addendum)
Patient did have subluxation and given an injection today.  Patient has significant hypoechoic changes and does have some activities coming up next week that I do think could be more beneficial.  We discussed with patient icing regimen, topical anti-inflammatories, which activities to do and which ones to avoid.  Follow-up again in 6 to 8 weeks otherwise.  Home exercises given as well today.

## 2022-01-13 ENCOUNTER — Other Ambulatory Visit: Payer: Self-pay

## 2022-01-13 DIAGNOSIS — M19041 Primary osteoarthritis, right hand: Secondary | ICD-10-CM

## 2022-01-13 MED ORDER — PREDNISONE 10 MG PO TABS: ORAL_TABLET | ORAL | 0 refills | Status: DC

## 2022-01-13 NOTE — Telephone Encounter (Signed)
Call in Prednisone 10 mg to take 2 tabs (20 mg) BID for 4 days and see if that helps. Call in # [phone]

## 2022-02-11 ENCOUNTER — Encounter: Payer: Self-pay | Admitting: Family Medicine

## 2022-02-11 MED ORDER — KETOCONAZOLE 2 % EX CREA: 1.0000 | TOPICAL_CREAM | Freq: Two times a day (BID) | CUTANEOUS | 5 refills | Status: AC

## 2022-02-11 NOTE — Progress Notes (Signed)
Zoe [name] Phone: [phone] Subjective:   Zoe Goltz, PhD, LAT, ATC acting as a scribe for Charlann Boxer, DO.  I'm seeing this patient by the request  of:  Laurey Morale, MD  CC: right wrist pain   [id number]  01/08/2022 Patient did have subluxation and given an injection today.  Patient has significant hypoechoic changes and does have some activities coming up next week that I do think could be more beneficial.  We discussed with patient icing regimen, topical anti-inflammatories, which activities to do and which ones to avoid.  Follow-up again in 6 to 8 weeks otherwise.  Home exercises given as well today.     Update 02/13/2022 Zoe [name] is a 51 y.o. female coming in with complaint of R wrist pain. Patient states back is feeling "fine." She is feeling like she is ready for a re-alignment. No numbness/tingling noted.      Past Medical History:  Diagnosis Date   Allergy    Anemia    Benign hematuria    worked up by Dr. Kathie Rhodes   Chickenpox    Horseshoe kidney    Hx of migraine headaches    Hyperlipidemia    Hypertension    Menorrhagia    resolved with ablation   Osteoarthritis    patient denies this dx   SVD (spontaneous vaginal delivery)    x 1   Thyroid disease    Past Surgical History:  Procedure Laterality Date   CESAREAN SECTION     x 2   CHOLECYSTECTOMY     COLONOSCOPY  11/16/2017   per Dr. Hilarie Fredrickson, sigmoid diverticulosis, no polyps, repeat in 5 yrs    SHOULDER SURGERY Right    arthroscopy   TUBAL LIGATION     TYMPANOSTOMY     Social History   Socioeconomic History   Marital status: Divorced    Spouse name: Not on file   Number of children: 3   Years of education: 12+   Highest education level: Not on file  Occupational History    Employer: [employer]  Tobacco Use   Smoking status: Never   Smokeless tobacco: Never  Vaping Use   Vaping Use:  Never used  Substance and Sexual Activity   Alcohol use: No    Alcohol/week: 0.0 standard drinks of alcohol   Drug use: No   Sexual activity: Yes    Birth control/protection: None    Comment: ablation  Other Topics Concern   Not on file  Social History Narrative   Patient is married.     Patient has 3 children.    Patient lives with parents.    Patient is employed with [employer] and [employer]    Patient has some college.    Social Determinants of Health   Financial Resource Strain: Not on file  Food Insecurity: Not on file  Transportation Needs: Not on file  Physical Activity: Not on file  Stress: Not on file  Social Connections: Not on file   Allergies  Allergen Reactions   Penicillins Hives   Family History  Problem Relation Age of Onset   Colon polyps Brother    Arthritis Other    Coronary artery disease Other    Depression Other    Diabetes Other    Hyperlipidemia Other    Sudden death Other    Colon cancer Neg Hx    Rectal cancer Neg Hx  Stomach cancer Neg Hx     Current Outpatient Medications (Endocrine & Metabolic):    glipiZIDE (GLUCOTROL) 10 MG tablet, Take 1 tablet (10 mg total) by mouth 2 (two) times daily before a meal.   levothyroxine (SYNTHROID) 25 MCG tablet, Take 3 tablets (75 mg ) by mouth daily   metFORMIN (GLUCOPHAGE) 500 MG tablet, Take 1 tablet (500 mg total) by mouth 2 (two) times daily with a meal.   Current Outpatient Medications (Cardiovascular):    atorvastatin (LIPITOR) 40 MG tablet, Take 1 tablet (40 mg total) by mouth daily.   lisinopril (ZESTRIL) 10 MG tablet, Take 1 tablet (10 mg total) by mouth daily.   Current Outpatient Medications (Respiratory):    albuterol (PROAIR HFA) 108 (90 Base) MCG/ACT inhaler, Inhale 2 puffs into the lungs every 6 (six) hours as needed for wheezing or shortness of breath.   fluticasone (FLONASE) 50 MCG/ACT nasal spray, Place 2 sprays into both nostrils daily.   levocetirizine (XYZAL) 5 MG tablet, TAKE 1 TABLET  BY MOUTH ONCE DAILY IN THE EVENING   montelukast (SINGULAIR) 10 MG tablet, TAKE 1 TABLET BY MOUTH AT BEDTIME   Current Outpatient Medications (Analgesics):    traMADol (ULTRAM) 50 MG tablet, Take 2 tablets (100 mg total) by mouth every 8 (eight) hours as needed. (Patient not taking: Reported on 02/13/2022)     Current Outpatient Medications (Other):    Cholecalciferol (VITAMIN D3 PO), Take by mouth.   diclofenac Sodium (VOLTAREN) 1 % GEL, Apply 4 g topically 4 (four) times daily.   ketoconazole (NIZORAL) 2 % cream, Apply 1 Application topically 2 (two) times daily.   Lancets (ONETOUCH DELICA PLUS [id number]) MISC, USE 1  TO CHECK GLUCOSE ONCE DAILY   nortriptyline (PAMELOR) 50 MG capsule, Take 1 capsule (50 mg total) by mouth at bedtime.   ONETOUCH VERIO test strip, USE 1 STRIP TO CHECK GLUCOSE ONCE DAILY   TART CHERRY PO, Take by mouth.   Turmeric (QC TUMERIC COMPLEX) 500 MG CAPS, Take by mouth.  Current Facility-Administered Medications (Other):    0.9 %  sodium chloride infusion   Reviewed prior external information including notes and imaging from  primary care provider As well as notes that were available from care everywhere and other healthcare systems.  Past medical history, social, surgical and family history all reviewed in electronic medical record.  No pertanent information unless stated regarding to the chief complaint.   Review of Systems:  No headache, visual changes, nausea, vomiting, diarrhea, constipation, dizziness, abdominal pain, skin rash, fevers, chills, night sweats, weight loss, swollen lymph nodes, body aches, joint swelling, chest pain, shortness of breath, mood changes. POSITIVE muscle aches  Objective  Blood pressure 128/82, pulse (!) 105, height '4\' 11"'$  (1.499 m), weight 218 lb (98.9 kg), SpO2 93 %.   General: No apparent distress alert and oriented x3 mood and affect normal, dressed appropriately.  HEENT: Pupils equal, extraocular movements intact   Respiratory: Patient's speak in full sentences and does not appear short of breath  Cardiovascular: No lower extremity edema, non tender, no erythema  Low back exam does have some loss of lordosis.  Some tightness noted in the paraspinal musculature.  Patient does have tightness with FABER test right greater than left.  Negative straight leg test.  Patient does have some stiffness with FABER test.   Osteopathic findings C2 flexed rotated and side bent right C4 flexed rotated and side bent left C6 flexed rotated and side bent left T3 extended rotated and  side bent right inhaled third rib T9 extended rotated and side bent left L2 flexed rotated and side bent right L5 flexed rotated and side bent left Sacrum right on right    Impression and Recommendations:    The above documentation has been reviewed and is accurate and complete Lyndal Pulley, DO

## 2022-02-11 NOTE — Telephone Encounter (Signed)
Done

## 2022-02-13 ENCOUNTER — Ambulatory Visit (INDEPENDENT_AMBULATORY_CARE_PROVIDER_SITE_OTHER): Payer: BC Managed Care – PPO | Admitting: Family Medicine

## 2022-02-13 VITALS — BP 128/82 | HR 105 | Ht 59.0 in | Wt 218.0 lb

## 2022-02-13 DIAGNOSIS — M9901 Segmental and somatic dysfunction of cervical region: Secondary | ICD-10-CM

## 2022-02-13 DIAGNOSIS — M9904 Segmental and somatic dysfunction of sacral region: Secondary | ICD-10-CM | POA: Diagnosis not present

## 2022-02-13 DIAGNOSIS — M9902 Segmental and somatic dysfunction of thoracic region: Secondary | ICD-10-CM

## 2022-02-13 DIAGNOSIS — M9908 Segmental and somatic dysfunction of rib cage: Secondary | ICD-10-CM

## 2022-02-13 DIAGNOSIS — M545 Low back pain, unspecified: Secondary | ICD-10-CM

## 2022-02-13 DIAGNOSIS — G8929 Other chronic pain: Secondary | ICD-10-CM

## 2022-02-13 DIAGNOSIS — M9903 Segmental and somatic dysfunction of lumbar region: Secondary | ICD-10-CM

## 2022-02-13 NOTE — Patient Instructions (Addendum)
Good to see you  Happy holidays  See me again in 2-3 months

## 2022-02-13 NOTE — Assessment & Plan Note (Signed)

## 2022-02-13 NOTE — Assessment & Plan Note (Signed)
Doing well with conservative therapy.  Discussed icing regimen Discussed core strength  RTC in 6 weeks

## 2022-02-25 ENCOUNTER — Encounter: Payer: Self-pay | Admitting: Family Medicine

## 2022-03-03 ENCOUNTER — Ambulatory Visit: Payer: BC Managed Care – PPO | Admitting: Family Medicine

## 2022-03-03 ENCOUNTER — Telehealth: Payer: Self-pay | Admitting: Family Medicine

## 2022-03-03 ENCOUNTER — Encounter: Payer: Self-pay | Admitting: Family Medicine

## 2022-03-03 VITALS — BP 100/62 | HR 105 | Temp 98.1°F | Ht 59.0 in | Wt 214.0 lb

## 2022-03-03 DIAGNOSIS — L989 Disorder of the skin and subcutaneous tissue, unspecified: Secondary | ICD-10-CM

## 2022-03-03 NOTE — Progress Notes (Signed)
   Subjective:    Patient ID: Zoe [name], female    DOB: 30-Sep-1970, 52 y.o.   MRN: [id number]  HPI Here to check some spots on the left cheek that she first noticed a few years ago. They are not symptomatic, but she is concerned because that are slowing enlarging.    Review of Systems  Constitutional: Negative.   Respiratory: Negative.    Cardiovascular: Negative.        Objective:   Physical Exam Constitutional:      Appearance: Normal appearance.  Cardiovascular:     Rate and Rhythm: Normal rate and regular rhythm.     Pulses: Normal pulses.     Heart sounds: Normal heart sounds.  Pulmonary:     Effort: Pulmonary effort is normal.     Breath sounds: Normal breath sounds.  Skin:    Comments: There are 3 raised scaly light brown lesions on the left cheek, one is larger and the other two are quite small   Neurological:     Mental Status: She is alert.           Assessment & Plan:  These are seborrheic keratoses. I explained that they are benign, but they will likely grow larger over time. She would like to have them removed. We will refer her to Dermatology. Alysia Penna, MD

## 2022-03-03 NOTE — Telephone Encounter (Signed)
Pt is calling and dr Link Snuffer dermatologist is doing Psychologist, sport and exercise only

## 2022-03-03 NOTE — Telephone Encounter (Signed)
Dermatology referral was sent to Dr. Link Snuffer

## 2022-03-04 NOTE — Telephone Encounter (Signed)
So please resend the referral to a different dermatologist

## 2022-03-04 NOTE — Telephone Encounter (Signed)
Spoke with patient, informed patient to contact [facility] to schedule an appointment with first available Dermatologist and if any issues to contact our office.

## 2022-03-21 ENCOUNTER — Encounter: Payer: Self-pay | Admitting: Family Medicine

## 2022-03-21 ENCOUNTER — Ambulatory Visit: Payer: BC Managed Care – PPO | Admitting: Family Medicine

## 2022-03-21 VITALS — BP 106/60 | HR 105 | Temp 98.4°F | Ht 59.0 in | Wt 208.4 lb

## 2022-03-21 DIAGNOSIS — R197 Diarrhea, unspecified: Secondary | ICD-10-CM | POA: Diagnosis not present

## 2022-03-21 NOTE — Progress Notes (Signed)
Established Patient Office Visit  Subjective   Patient ID: Zoe [name], female    DOB: 10/21/1970  Age: 52 y.o. MRN: [id number]  Chief Complaint  Patient presents with   Diarrhea    Patient complains of diarrhea, x2 days,    Generalized Body Aches    Patient complains of body aches, x2 days    HPI   Seen with onset 2 days ago of bodyaches and "flulike symptoms ".  Her main symptoms besides achiness were diarrhea and decreased appetite.  No fever.  No bloody stools.  She states she has been going about 4-5 times per day with watery nonbloody stools.  No nausea or vomiting.  Home COVID test negative.  No recent antibiotics.  No recent travels.  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  No associated abdominal pain.  She has managed to keep down some fluids and some foods.  She has type 2 diabetes which has been stable.  Past Medical History:  Diagnosis Date   Allergy    Anemia    Benign hematuria    worked up by Dr. Kathie Rhodes   Chickenpox    Horseshoe kidney    Hx of migraine headaches    Hyperlipidemia    Hypertension    Menorrhagia    resolved with ablation   Osteoarthritis    patient denies this dx   SVD (spontaneous vaginal delivery)    x 1   Thyroid disease    Past Surgical History:  Procedure Laterality Date   CESAREAN SECTION     x 2   CHOLECYSTECTOMY     COLONOSCOPY  11/16/2017   per Dr. Hilarie Fredrickson, sigmoid diverticulosis, no polyps, repeat in 5 yrs    SHOULDER SURGERY Right    arthroscopy   TUBAL LIGATION     TYMPANOSTOMY      reports that she has never smoked. She has never used smokeless tobacco. She reports that she does not drink alcohol and does not use drugs. family history includes Arthritis in an other family member; Colon polyps in her brother; Coronary artery disease in an other family member; Depression in an other family member; Diabetes in an other family member; Hyperlipidemia in an other family member; Sudden death in an  other family member. Allergies  Allergen Reactions   Molds & Smuts Other (See Comments)   Other Other (See Comments)   Penicillins Hives   Pollen Extract Other (See Comments)   Short Ragweed Pollen Ext Other (See Comments)    Review of Systems  Constitutional:  Negative for chills and fever.  Respiratory:  Negative for cough.   Gastrointestinal:  Positive for diarrhea. Negative for abdominal pain, nausea and vomiting.  Genitourinary:  Negative for dysuria.      Objective:     BP 106/60 (BP Location: Left Arm, Patient Position: Sitting, Cuff Size: Large)   Pulse (!) 105   Temp 98.4 F (36.9 C) (Oral)   Ht '4\' 11"'$  (1.499 m)   Wt 208 lb 6.4 oz (94.5 kg)   SpO2 98%   BMI 42.09 kg/m    Physical Exam Vitals reviewed.  Constitutional:      General: She is not in acute distress.    Appearance: She is not ill-appearing.  HENT:     Mouth/Throat:     Mouth: Mucous membranes are moist.     Pharynx: Oropharynx is clear.  Cardiovascular:     Rate and Rhythm: Normal rate and regular rhythm.  Pulmonary:     Effort: Pulmonary effort is normal.     Breath sounds: Normal breath sounds.  Abdominal:     General: There is no distension.     Palpations: Abdomen is soft.     Tenderness: There is no abdominal tenderness. There is no guarding or rebound.  Neurological:     Mental Status: She is alert.      No results found for any visits on 03/21/22.    The 10-year ASCVD risk score (Arnett DK, et al., 2019) is: 2.2%    Assessment & Plan:   Problem List Items Addressed This Visit   None Visit Diagnoses     Diarrhea, unspecified type    -  Primary     -Suspect probable viral gastroenteritis.  Home COVID screen negative.  No recent antibiotic use.  Does not appear to be dehydrated. -Continue plenty of fluids -Consider over-the-counter Imodium as needed for diarrhea symptoms -Handout on appropriate diet for diarrhea -Follow-up for any persistent or worsening  symptoms  No follow-ups on file.    Carolann Littler, MD

## 2022-03-31 ENCOUNTER — Other Ambulatory Visit: Payer: Self-pay | Admitting: Family Medicine

## 2022-04-15 NOTE — Progress Notes (Unsigned)
Zoe [name] [facility] [address] [place] Phone: [phone] Subjective:   [name], am serving as a scribe for Dr. Hulan Saas.  I'm seeing this patient by the request  of:  Laurey Morale, MD  CC: Low back and neck pain follow-up  [id number]  Zoe [name] is a 52 y.o. female coming in with complaint of back and neck pain. OMT 02/13/2022. Patient states doing okay. Thinks she needs to start coming back every 6 weeks instead of 8. Could really feel the back pain recently. No new concerns.  Medications patient has been prescribed: None  Taking:         Reviewed prior external information including notes and imaging from previsou exam, outside providers and external EMR if available.   As well as notes that were available from care everywhere and other healthcare systems.  Past medical history, social, surgical and family history all reviewed in electronic medical record.  No pertanent information unless stated regarding to the chief complaint.   Past Medical History:  Diagnosis Date   Allergy    Anemia    Benign hematuria    worked up by Dr. Kathie Rhodes   Chickenpox    Horseshoe kidney    Hx of migraine headaches    Hyperlipidemia    Hypertension    Menorrhagia    resolved with ablation   Osteoarthritis    patient denies this dx   SVD (spontaneous vaginal delivery)    x 1   Thyroid disease     Allergies  Allergen Reactions   Molds & Smuts Other (See Comments)   Other Other (See Comments)   Penicillins Hives   Pollen Extract Other (See Comments)   Short Ragweed Pollen Ext Other (See Comments)     Review of Systems:  No headache, visual changes, nausea, vomiting, diarrhea, constipation, dizziness, abdominal pain, skin rash, fevers, chills, night sweats, weight loss, swollen lymph nodes, body aches, joint swelling, chest pain, shortness of breath, mood changes. POSITIVE muscle aches  Objective  Blood  pressure 116/64, pulse (!) 106, height 4' 11"$  (1.499 m), weight 215 lb (97.5 kg), SpO2 96 %.   General: No apparent distress alert and oriented x3 mood and affect normal, dressed appropriately.  HEENT: Pupils equal, extraocular movements intact  Respiratory: Patient's speak in full sentences and does not appear short of breath  Cardiovascular: No lower extremity edema, non tender, no erythema  Patient does have some increase in tightness noted in the upper thoracic area.  He does have some scapular dyskinesis noted.  Significant tightness with FABER test with tenderness over the sacroiliac joint bilaterally.  Poor core strength noted.  Osteopathic findings  C2 flexed rotated and side bent right C6 flexed rotated and side bent left T3 extended rotated and side bent right inhaled rib T8 extended rotated and side bent left L1 flexed rotated and side bent right L4 flexed rotated and side bent left Sacrum right on right       Assessment and Plan:  Low back pain Chronic problem with mild exacerbation.  Patient is having some lower back pain.  Does not appear to be anything to do with her regimen and home exercises.  Increase activity slowly otherwise.  Follow-up with me again in 6 to 8 weeks.    Nonallopathic problems  Decision today to treat with OMT was based on Physical Exam  After verbal consent patient was treated with HVLA, ME, FPR techniques in  cervical, rib, thoracic, lumbar, and sacral  areas  Patient tolerated the procedure well with improvement in symptoms  Patient given exercises, stretches and lifestyle modifications  See medications in patient instructions if given  Patient will follow up in 4-8 weeks    The above documentation has been reviewed and is accurate and complete Lyndal Pulley, DO          Note: This dictation was prepared with Dragon dictation along with smaller phrase technology. Any transcriptional errors that result from this process are  unintentional.

## 2022-04-16 ENCOUNTER — Encounter: Payer: Self-pay | Admitting: Family Medicine

## 2022-04-16 ENCOUNTER — Ambulatory Visit: Payer: BC Managed Care – PPO | Admitting: Family Medicine

## 2022-04-16 VITALS — BP 116/64 | HR 106 | Ht 59.0 in | Wt 215.0 lb

## 2022-04-16 DIAGNOSIS — M9902 Segmental and somatic dysfunction of thoracic region: Secondary | ICD-10-CM

## 2022-04-16 DIAGNOSIS — M545 Low back pain, unspecified: Secondary | ICD-10-CM | POA: Diagnosis not present

## 2022-04-16 DIAGNOSIS — M9901 Segmental and somatic dysfunction of cervical region: Secondary | ICD-10-CM

## 2022-04-16 DIAGNOSIS — M9904 Segmental and somatic dysfunction of sacral region: Secondary | ICD-10-CM | POA: Diagnosis not present

## 2022-04-16 DIAGNOSIS — M9903 Segmental and somatic dysfunction of lumbar region: Secondary | ICD-10-CM

## 2022-04-16 DIAGNOSIS — M9908 Segmental and somatic dysfunction of rib cage: Secondary | ICD-10-CM

## 2022-04-16 DIAGNOSIS — G8929 Other chronic pain: Secondary | ICD-10-CM

## 2022-04-16 NOTE — Assessment & Plan Note (Signed)
Chronic problem with mild exacerbation.  Patient is having some lower back pain.  Does not appear to be anything to do with her regimen and home exercises.  Increase activity slowly otherwise.  Follow-up with me again in 6 to 8 weeks.

## 2022-04-16 NOTE — Patient Instructions (Signed)
You are awesome overall  Find time for you and the exercises See me again in 6 weeks

## 2022-05-13 ENCOUNTER — Other Ambulatory Visit: Payer: Self-pay | Admitting: Family Medicine

## 2022-05-25 ENCOUNTER — Other Ambulatory Visit: Payer: Self-pay | Admitting: Family Medicine

## 2022-05-27 ENCOUNTER — Other Ambulatory Visit: Payer: Self-pay | Admitting: Family Medicine

## 2022-05-27 ENCOUNTER — Other Ambulatory Visit: Payer: Self-pay

## 2022-05-27 ENCOUNTER — Telehealth: Payer: Self-pay | Admitting: Family Medicine

## 2022-05-27 MED ORDER — LEVOTHYROXINE SODIUM 25 MCG PO TABS: ORAL_TABLET | ORAL | 2 refills | Status: DC

## 2022-05-27 NOTE — Telephone Encounter (Signed)
Prescription Request  05/27/2022  LOV: 03/03/2022  What is the name of the medication or equipment?  levothyroxine (SYNTHROID) 25 MCG tablet  Have you contacted your pharmacy to request a refill? Yes   Pt called to say she contacted the pharmacy and was told MD is refusing to refill.  Pt would like to know what she has to do to get it refilled, or the reason MD is denying the refill?   Which pharmacy would you like this sent to?  [facility], [address]. [place] [address] Phone: [phone] Fax: [phone]    Patient notified that their request is being sent to the clinical staff for review and that they should receive a response within 2 business days.   Please advise at Mobile [phone] (mobile)

## 2022-05-27 NOTE — Telephone Encounter (Signed)
Refill sent to [facility].   Called patient made aware.

## 2022-05-28 ENCOUNTER — Telehealth: Payer: Self-pay | Admitting: Family Medicine

## 2022-05-28 NOTE — Telephone Encounter (Signed)
Pt called to FU on this refill request - Pt needs she needs the 75 mg, and stated she will be officially out of meds this weekend.

## 2022-05-28 NOTE — Telephone Encounter (Signed)
Prescription Request  05/28/2022  LOV: 03/03/2022  What is the name of the medication or equipment? Levothyroxine. Pt said the 25 MG was called in, however she needs the 75 MG.   Have you contacted your pharmacy to request a refill? Yes   Which pharmacy would you like this sent to?  [facility], [address] [place]. [place] [address] Phone: [phone] Fax: [phone]    Patient notified that their request is being sent to the clinical staff for review and that they should receive a response within 2 business days.   Please advise at Mobile [phone] (mobile)

## 2022-05-29 ENCOUNTER — Other Ambulatory Visit: Payer: Self-pay

## 2022-05-29 DIAGNOSIS — E038 Other specified hypothyroidism: Secondary | ICD-10-CM

## 2022-05-29 MED ORDER — LEVOTHYROXINE SODIUM 75 MCG PO TABS: 75.0000 ug | ORAL_TABLET | Freq: Every day | ORAL | 3 refills | Status: DC

## 2022-05-29 NOTE — Telephone Encounter (Signed)
Please advise if okay for MG change

## 2022-05-29 NOTE — Telephone Encounter (Signed)
Patient requesting a call to confirm change has been made

## 2022-05-29 NOTE — Telephone Encounter (Signed)
Called patient informed Levothyroxine 71mcg,  prescription cancelled.    Levothyroxine 105mcg sent to [facility].

## 2022-05-29 NOTE — Telephone Encounter (Signed)
Her total dosage has always been 75 mcg daily. I think the last time this was refilled last year, the pharmacy was out of the 75 mcg size, so we had her take three of the 25 every day. So now, please cancel the 25 mcg RX, and send in a new one for 75 mcg daily, #90 with 3 rf

## 2022-06-02 NOTE — Progress Notes (Unsigned)
[name] [address] [place] [address] Phone: [phone] Subjective:   Bruce Donath, am serving as a scribe for Dr. Antoine Primas.  I'm seeing this patient by the request  of:  Nelwyn Salisbury, MD  CC: Low back pain follow-up  [id number]  Virginia D Scheib is a 52 y.o. female coming in with complaint of back and neck pain. OMT on 04/16/2022. Patient states that she has been doing well.  Has had some difficulty with some tightness but nothing that stops her from activity.  Medications patient has been prescribed:   Taking:         Reviewed prior external information including notes and imaging from previsou exam, outside providers and external EMR if available.   As well as notes that were available from care everywhere and other healthcare systems.  Past medical history, social, surgical and family history all reviewed in electronic medical record.  No pertanent information unless stated regarding to the chief complaint.   Past Medical History:  Diagnosis Date   Allergy    Anemia    Benign hematuria    worked up by Dr. Ihor Gully   Chickenpox    Horseshoe kidney    Hx of migraine headaches    Hyperlipidemia    Hypertension    Menorrhagia    resolved with ablation   Osteoarthritis    patient denies this dx   SVD (spontaneous vaginal delivery)    x 1   Thyroid disease     Allergies  Allergen Reactions   Molds & Smuts Other (See Comments)   Other Other (See Comments)   Penicillins Hives   Pollen Extract Other (See Comments)   Short Ragweed Pollen Ext Other (See Comments)     Review of Systems:  No headache, visual changes, nausea, vomiting, diarrhea, constipation, dizziness, abdominal pain, skin rash, fevers, chills, night sweats, weight loss, swollen lymph nodes, body aches, joint swelling, chest pain, shortness of breath, mood changes. POSITIVE muscle aches  Objective  Blood pressure 92/60, pulse (!) 106,  height 4\' 11"  (1.499 m), weight 218 lb (98.9 kg), SpO2 96 %.   General: No apparent distress alert and oriented x3 mood and affect normal, dressed appropriately.  HEENT: Pupils equal, extraocular movements intact  Respiratory: Patient's speak in full sentences and does not appear short of breath  Cardiovascular: No lower extremity edema, non tender, no erythema  Low back exam does have some loss of lordosis.  Tightness noted more in the thoracolumbar juncture.  Right greater than left  Osteopathic findings  C2 flexed rotated and side bent right  T3 extended rotated and side bent right inhaled rib T11 extended rotated and side bent right L2 flexed rotated and side bent right L4 flexed rotated and side bent left Sacrum right on right       Assessment and Plan:  Low back pain Continue to work on core strengthening.  Discussed with patient about icing regimen and home exercises otherwise.  Patient is doing well with the conservative therapy.  Does respond well to osteopathic manipulation and follow-up again in 8 to 12 weeks for further evaluation and treatment    Nonallopathic problems  Decision today to treat with OMT was based on Physical Exam  After verbal consent patient was treated with HVLA, ME, FPR techniques in cervical, rib, thoracic, lumbar, and sacral  areas  Patient tolerated the procedure well with improvement in symptoms  Patient given exercises, stretches and lifestyle  modifications  See medications in patient instructions if given  Patient will follow up in 4-8 weeks             Note: This dictation was prepared with Dragon dictation along with smaller phrase technology. Any transcriptional errors that result from this process are unintentional.

## 2022-06-03 ENCOUNTER — Ambulatory Visit (INDEPENDENT_AMBULATORY_CARE_PROVIDER_SITE_OTHER): Payer: BC Managed Care – PPO | Admitting: Family Medicine

## 2022-06-03 ENCOUNTER — Encounter: Payer: Self-pay | Admitting: Family Medicine

## 2022-06-03 VITALS — BP 92/60 | HR 106 | Ht 59.0 in | Wt 218.0 lb

## 2022-06-03 DIAGNOSIS — M545 Low back pain, unspecified: Secondary | ICD-10-CM | POA: Diagnosis not present

## 2022-06-03 DIAGNOSIS — M9903 Segmental and somatic dysfunction of lumbar region: Secondary | ICD-10-CM

## 2022-06-03 DIAGNOSIS — M9901 Segmental and somatic dysfunction of cervical region: Secondary | ICD-10-CM | POA: Diagnosis not present

## 2022-06-03 DIAGNOSIS — M9902 Segmental and somatic dysfunction of thoracic region: Secondary | ICD-10-CM | POA: Diagnosis not present

## 2022-06-03 DIAGNOSIS — M9908 Segmental and somatic dysfunction of rib cage: Secondary | ICD-10-CM | POA: Diagnosis not present

## 2022-06-03 DIAGNOSIS — G8929 Other chronic pain: Secondary | ICD-10-CM

## 2022-06-03 DIAGNOSIS — M9904 Segmental and somatic dysfunction of sacral region: Secondary | ICD-10-CM

## 2022-06-03 NOTE — Patient Instructions (Signed)
Enjoy your cooking partner Find some time to do some of the stretches See you again in 2-3 months

## 2022-06-03 NOTE — Assessment & Plan Note (Signed)
Continue to work on core strengthening.  Discussed with patient about icing regimen and home exercises otherwise.  Patient is doing well with the conservative therapy.  Does respond well to osteopathic manipulation and follow-up again in 8 to 12 weeks for further evaluation and treatment

## 2022-07-01 ENCOUNTER — Other Ambulatory Visit: Payer: Self-pay | Admitting: Family Medicine

## 2022-07-02 ENCOUNTER — Other Ambulatory Visit: Payer: Self-pay | Admitting: Family Medicine

## 2022-07-02 DIAGNOSIS — E785 Hyperlipidemia, unspecified: Secondary | ICD-10-CM

## 2022-07-11 ENCOUNTER — Other Ambulatory Visit: Payer: Self-pay

## 2022-07-11 ENCOUNTER — Encounter: Payer: Self-pay | Admitting: Family Medicine

## 2022-07-11 ENCOUNTER — Ambulatory Visit (INDEPENDENT_AMBULATORY_CARE_PROVIDER_SITE_OTHER): Payer: BC Managed Care – PPO | Admitting: Family Medicine

## 2022-07-11 VITALS — BP 120/82 | HR 94 | Temp 98.7°F | Ht 59.0 in | Wt 217.8 lb

## 2022-07-11 DIAGNOSIS — E038 Other specified hypothyroidism: Secondary | ICD-10-CM

## 2022-07-11 DIAGNOSIS — E119 Type 2 diabetes mellitus without complications: Secondary | ICD-10-CM

## 2022-07-11 DIAGNOSIS — Z0000 Encounter for general adult medical examination without abnormal findings: Secondary | ICD-10-CM

## 2022-07-11 LAB — HEPATIC FUNCTION PANEL
ALT: 40 U/L — ABNORMAL HIGH (ref 0–35)
AST: 26 U/L (ref 0–37)
Albumin: 4.1 g/dL (ref 3.5–5.2)
Alkaline Phosphatase: 79 U/L (ref 39–117)
Bilirubin, Direct: 0.1 mg/dL (ref 0.0–0.3)
Total Bilirubin: 0.4 mg/dL (ref 0.2–1.2)
Total Protein: 7.5 g/dL (ref 6.0–8.3)

## 2022-07-11 LAB — CBC WITH DIFFERENTIAL/PLATELET
Basophils Absolute: 0.1 10*3/uL (ref 0.0–0.1)
Basophils Relative: 0.8 % (ref 0.0–3.0)
Eosinophils Absolute: 0.3 10*3/uL (ref 0.0–0.7)
Eosinophils Relative: 4 % (ref 0.0–5.0)
HCT: 45.9 % (ref 36.0–46.0)
Hemoglobin: 15.7 g/dL — ABNORMAL HIGH (ref 12.0–15.0)
Lymphocytes Relative: 42.9 % (ref 12.0–46.0)
Lymphs Abs: 3.3 10*3/uL (ref 0.7–4.0)
MCHC: 34.3 g/dL (ref 30.0–36.0)
MCV: 90.8 fl (ref 78.0–100.0)
Monocytes Absolute: 0.6 10*3/uL (ref 0.1–1.0)
Monocytes Relative: 8.2 % (ref 3.0–12.0)
Neutro Abs: 3.4 10*3/uL (ref 1.4–7.7)
Neutrophils Relative %: 44.1 % (ref 43.0–77.0)
Platelets: 312 10*3/uL (ref 150.0–400.0)
RBC: 5.06 Mil/uL (ref 3.87–5.11)
RDW: 14.6 % (ref 11.5–15.5)
WBC: 7.7 10*3/uL (ref 4.0–10.5)

## 2022-07-11 LAB — BASIC METABOLIC PANEL
BUN: 18 mg/dL (ref 6–23)
CO2: 24 mEq/L (ref 19–32)
Calcium: 9.3 mg/dL (ref 8.4–10.5)
Chloride: 104 mEq/L (ref 96–112)
Creatinine, Ser: 0.78 mg/dL (ref 0.40–1.20)
GFR: 87.13 mL/min (ref >60.00)
Glucose, Bld: 123 mg/dL — ABNORMAL HIGH (ref 70–99)
Potassium: 4 mEq/L (ref 3.5–5.1)
Sodium: 138 mEq/L (ref 135–145)

## 2022-07-11 LAB — LDL CHOLESTEROL, DIRECT: Direct LDL: 101 mg/dL

## 2022-07-11 LAB — LIPID PANEL
Cholesterol: 173 mg/dL (ref 0–200)
HDL: 43.8 mg/dL (ref >39.00)
NonHDL: 129.37
Total CHOL/HDL Ratio: 4
Triglycerides: 262 mg/dL — ABNORMAL HIGH (ref 0.0–149.0)
VLDL: 52.4 mg/dL — ABNORMAL HIGH (ref 0.0–40.0)

## 2022-07-11 LAB — T4, FREE: Free T4: 0.71 ng/dL (ref 0.60–1.60)

## 2022-07-11 LAB — TSH: TSH: 8.66 u[IU]/mL — ABNORMAL HIGH (ref 0.35–5.50)

## 2022-07-11 LAB — T3, FREE: T3, Free: 2.6 pg/mL (ref 2.3–4.2)

## 2022-07-11 LAB — HEMOGLOBIN A1C: Hgb A1c MFr Bld: 7.1 % — ABNORMAL HIGH (ref 4.6–6.5)

## 2022-07-11 MED ORDER — ALBUTEROL SULFATE HFA 108 (90 BASE) MCG/ACT IN AERS: 2.0000 | INHALATION_SPRAY | Freq: Four times a day (QID) | RESPIRATORY_TRACT | 11 refills | Status: DC | PRN

## 2022-07-11 MED ORDER — NORTRIPTYLINE HCL 50 MG PO CAPS: 50.0000 mg | ORAL_CAPSULE | Freq: Every day | ORAL | 3 refills | Status: DC

## 2022-07-11 MED ORDER — METFORMIN HCL 500 MG PO TABS: 500.0000 mg | ORAL_TABLET | Freq: Two times a day (BID) | ORAL | 3 refills | Status: DC

## 2022-07-11 MED ORDER — LISINOPRIL 10 MG PO TABS: 10.0000 mg | ORAL_TABLET | Freq: Every day | ORAL | 3 refills | Status: DC

## 2022-07-11 MED ORDER — LEVOTHYROXINE SODIUM 100 MCG PO TABS
100.0000 ug | ORAL_TABLET | Freq: Every day | ORAL | 3 refills | Status: DC
Start: 2022-07-11 — End: 2022-08-06

## 2022-07-11 MED ORDER — FLUTICASONE PROPIONATE 50 MCG/ACT NA SUSP: 2.0000 | Freq: Every day | NASAL | 3 refills | Status: DC

## 2022-07-11 MED ORDER — GLIPIZIDE 10 MG PO TABS: 10.0000 mg | ORAL_TABLET | Freq: Two times a day (BID) | ORAL | 3 refills | Status: DC

## 2022-07-11 NOTE — Progress Notes (Signed)
   Subjective:    Patient ID: Zoe [name], female    DOB: December 29, 1970, 52 y.o.   MRN: [id number]  HPI Here for a well exam. She feels well in general. This has been a tough week for her because her mother, who has been living with her, passed away a few days ago.    Review of Systems  Constitutional: Negative.   HENT: Negative.    Eyes: Negative.   Respiratory: Negative.    Cardiovascular: Negative.   Gastrointestinal: Negative.   Genitourinary:  Negative for decreased urine volume, difficulty urinating, dyspareunia, dysuria, enuresis, flank pain, frequency, hematuria, pelvic pain and urgency.  Musculoskeletal: Negative.   Skin: Negative.   Neurological: Negative.  Negative for headaches.  Psychiatric/Behavioral: Negative.         Objective:   Physical Exam Constitutional:      General: She is not in acute distress.    Appearance: She is well-developed. She is obese.  HENT:     Head: Normocephalic and atraumatic.     Right Ear: External ear normal.     Left Ear: External ear normal.     Nose: Nose normal.     Mouth/Throat:     Pharynx: No oropharyngeal exudate.  Eyes:     General: No scleral icterus.    Conjunctiva/sclera: Conjunctivae normal.     Pupils: Pupils are equal, round, and reactive to light.  Neck:     Thyroid: No thyromegaly.     Vascular: No JVD.  Cardiovascular:     Rate and Rhythm: Normal rate and regular rhythm.     Pulses: Normal pulses.     Heart sounds: Normal heart sounds. No murmur heard.    No friction rub. No gallop.  Pulmonary:     Effort: Pulmonary effort is normal. No respiratory distress.     Breath sounds: Normal breath sounds. No wheezing or rales.  Chest:     Chest wall: No tenderness.  Abdominal:     General: Bowel sounds are normal. There is no distension.     Palpations: Abdomen is soft. There is no mass.     Tenderness: There is no abdominal tenderness. There is no guarding or rebound.  Musculoskeletal:        General: No  tenderness. Normal range of motion.     Cervical back: Normal range of motion and neck supple.  Lymphadenopathy:     Cervical: No cervical adenopathy.  Skin:    General: Skin is warm and dry.     Findings: No erythema or rash.  Neurological:     Mental Status: She is alert and oriented to person, place, and time.     Cranial Nerves: No cranial nerve deficit.     Motor: No abnormal muscle tone.     Coordination: Coordination normal.     Deep Tendon Reflexes: Reflexes are normal and symmetric. Reflexes normal.  Psychiatric:        Behavior: Behavior normal.        Thought Content: Thought content normal.        Judgment: Judgment normal.           Assessment & Plan:  Well exam. We discussed diet and exercise. Get fasting labs. Her colonoscopy will be coming up in September. She gets an eye exam every year. Gershon Crane, MD

## 2022-07-15 ENCOUNTER — Ambulatory Visit: Payer: BC Managed Care – PPO | Admitting: Family Medicine

## 2022-07-15 NOTE — Progress Notes (Deleted)
  [name] [address] [place] [address] Phone: [phone] Subjective:    I'm seeing this patient by the request  of:  Nelwyn Salisbury, MD  CC:   [id number]  Nami D Iruegas is a 52 y.o. female coming in with complaint of back and neck pain. OMT on 06/03/2022. Patient states   Medications patient has been prescribed:   Taking:         Reviewed prior external information including notes and imaging from previsou exam, outside providers and external EMR if available.   As well as notes that were available from care everywhere and other healthcare systems.  Past medical history, social, surgical and family history all reviewed in electronic medical record.  No pertanent information unless stated regarding to the chief complaint.   Past Medical History:  Diagnosis Date   Allergy    Anemia    Benign hematuria    worked up by Dr. Ihor Gully   Chickenpox    Horseshoe kidney    Hx of migraine headaches    Hyperlipidemia    Hypertension    Menorrhagia    resolved with ablation   Osteoarthritis    patient denies this dx   SVD (spontaneous vaginal delivery)    x 1   Thyroid disease     Allergies  Allergen Reactions   Molds & Smuts Other (See Comments)   Other Other (See Comments)   Penicillins Hives   Pollen Extract Other (See Comments)   Short Ragweed Pollen Ext Other (See Comments)     Review of Systems:  No headache, visual changes, nausea, vomiting, diarrhea, constipation, dizziness, abdominal pain, skin rash, fevers, chills, night sweats, weight loss, swollen lymph nodes, body aches, joint swelling, chest pain, shortness of breath, mood changes. POSITIVE muscle aches  Objective  There were no vitals taken for this visit.   General: No apparent distress alert and oriented x3 mood and affect normal, dressed appropriately.  HEENT: Pupils equal, extraocular movements intact  Respiratory: Patient's speak in full  sentences and does not appear short of breath  Cardiovascular: No lower extremity edema, non tender, no erythema  Gait MSK:  Back   Osteopathic findings  C2 flexed rotated and side bent right C6 flexed rotated and side bent left T3 extended rotated and side bent right inhaled rib T9 extended rotated and side bent left L2 flexed rotated and side bent right Sacrum right on right       Assessment and Plan:  No problem-specific Assessment & Plan notes found for this encounter.    Nonallopathic problems  Decision today to treat with OMT was based on Physical Exam  After verbal consent patient was treated with HVLA, ME, FPR techniques in cervical, rib, thoracic, lumbar, and sacral  areas  Patient tolerated the procedure well with improvement in symptoms  Patient given exercises, stretches and lifestyle modifications  See medications in patient instructions if given  Patient will follow up in 4-8 weeks     The above documentation has been reviewed and is accurate and complete Judi Saa, DO         Note: This dictation was prepared with Dragon dictation along with smaller phrase technology. Any transcriptional errors that result from this process are unintentional.

## 2022-07-17 ENCOUNTER — Other Ambulatory Visit: Payer: Self-pay

## 2022-07-22 NOTE — Progress Notes (Unsigned)
[name] [address] [place] [address] Phone: [phone] Subjective:   Bruce Donath, am serving as a scribe for Dr. Antoine Primas.  I'm seeing this patient by the request  of:  Nelwyn Salisbury, MD  CC: Back and neck pain follow-up  [id number]  Zoe [name] is a 52 y.o. female coming in with complaint of back and neck pain. OMT on 06/03/2022. Patient states overall does not have any significant pain that is stopping her from activity but has not been doing as much activity.  Patient recently did lose her mother.  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         Reviewed prior external information including notes and imaging from previsou exam, outside providers and external EMR if available.   As well as notes that were available from care everywhere and other healthcare systems.  Past medical history, social, surgical and family history all reviewed in electronic medical record.  No pertanent information unless stated regarding to the chief complaint.   Past Medical History:  Diagnosis Date   Allergy    Anemia    Benign hematuria    worked up by Dr. Ihor Gully   Chickenpox    Horseshoe kidney    Hx of migraine headaches    Hyperlipidemia    Hypertension    Menorrhagia    resolved with ablation   Osteoarthritis    patient denies this dx   SVD (spontaneous vaginal delivery)    x 1   Thyroid disease     Allergies  Allergen Reactions   Molds & Smuts Other (See Comments)   Other Other (See Comments)   Penicillins Hives   Pollen Extract Other (See Comments)   Short Ragweed Pollen Ext Other (See Comments)     Review of Systems:  No headache, visual changes, nausea, vomiting, diarrhea, constipation, dizziness, abdominal pain, skin rash, fevers, chills, night sweats, weight loss, swollen lymph nodes, body aches, joint swelling, chest pain, shortness of breath, mood changes. POSITIVE muscle aches  Objective   Blood pressure 110/70, pulse 100, height 4\' 11"  (1.499 m), weight 217 lb (98.4 kg), SpO2 98 %.   General: No apparent distress alert and oriented x3 mood and affect normal, dressed appropriately.  HEENT: Pupils equal, extraocular movements intact  Respiratory: Patient's speak in full sentences and does not appear short of breath  Cardiovascular: No lower extremity edema, non tender, no erythema  Low back does have significant loss lordosis noted.  Tightness noted with FABER test bilaterally.  Osteopathic findings  C3 flexed rotated and side bent right C7 flexed rotated and side bent left T3 extended rotated and side bent right inhaled rib T8 extended rotated and side bent left L2 flexed rotated and side bent right Sacrum right on right       Assessment and Plan:  Low back pain Chronic, multifactorial.  Unfortunately did recently lose her mother.  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  Was doing a lot of lifting and may be doing less of that that could be potentially beneficial.  Discussed icing regimen and home exercises otherwise.  Increase activity slowly.  Follow-up again in 6 to 8 weeks.    Nonallopathic problems  Decision today to treat with OMT was based on Physical Exam  After verbal consent patient was treated with HVLA, ME, FPR techniques in cervical, rib, thoracic, lumbar, and sacral  areas  Patient tolerated the procedure well with  improvement in symptoms  Patient given exercises, stretches and lifestyle modifications  See medications in patient instructions if given  Patient will follow up in 4-8 weeks    The above documentation has been reviewed and is accurate and complete Judi Saa, DO where         Note: This dictation was prepared with Dragon dictation along with smaller phrase technology. Any transcriptional errors that result from this process are unintentional.

## 2022-07-24 ENCOUNTER — Encounter: Payer: Self-pay | Admitting: Family Medicine

## 2022-07-24 ENCOUNTER — Ambulatory Visit (INDEPENDENT_AMBULATORY_CARE_PROVIDER_SITE_OTHER): Payer: BC Managed Care – PPO | Admitting: Family Medicine

## 2022-07-24 ENCOUNTER — Other Ambulatory Visit: Payer: Self-pay | Admitting: Family Medicine

## 2022-07-24 VITALS — BP 110/70 | HR 100 | Ht 59.0 in | Wt 217.0 lb

## 2022-07-24 DIAGNOSIS — M9902 Segmental and somatic dysfunction of thoracic region: Secondary | ICD-10-CM

## 2022-07-24 DIAGNOSIS — M9904 Segmental and somatic dysfunction of sacral region: Secondary | ICD-10-CM | POA: Diagnosis not present

## 2022-07-24 DIAGNOSIS — M9908 Segmental and somatic dysfunction of rib cage: Secondary | ICD-10-CM | POA: Diagnosis not present

## 2022-07-24 DIAGNOSIS — M9903 Segmental and somatic dysfunction of lumbar region: Secondary | ICD-10-CM | POA: Diagnosis not present

## 2022-07-24 DIAGNOSIS — M9901 Segmental and somatic dysfunction of cervical region: Secondary | ICD-10-CM

## 2022-07-24 DIAGNOSIS — M545 Low back pain, unspecified: Secondary | ICD-10-CM

## 2022-07-24 DIAGNOSIS — G8929 Other chronic pain: Secondary | ICD-10-CM | POA: Diagnosis not present

## 2022-07-24 NOTE — Assessment & Plan Note (Signed)
Chronic, multifactorial.  Unfortunately did recently lose her mother.  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  Was doing a lot of lifting and may be doing less of that that could be potentially beneficial.  Discussed icing regimen and home exercises otherwise.  Increase activity slowly.  Follow-up again in 6 to 8 weeks.

## 2022-07-24 NOTE — Patient Instructions (Signed)
Sorry for your loss Keep looking out for yourself Get some sleep Stay active See me in 8 weeks

## 2022-08-04 ENCOUNTER — Other Ambulatory Visit: Payer: Self-pay

## 2022-08-04 ENCOUNTER — Encounter (HOSPITAL_COMMUNITY): Payer: Self-pay | Admitting: *Deleted

## 2022-08-04 ENCOUNTER — Emergency Department (HOSPITAL_COMMUNITY)
Admission: EM | Admit: 2022-08-04 | Discharge: 2022-08-04 | Disposition: A | Discharge status: Home / Self Care | Payer: BC Managed Care – PPO | Attending: Emergency Medicine | Admitting: Emergency Medicine

## 2022-08-04 ENCOUNTER — Emergency Department (HOSPITAL_COMMUNITY): Payer: BC Managed Care – PPO

## 2022-08-04 DIAGNOSIS — Z79899 Other long term (current) drug therapy: Secondary | ICD-10-CM | POA: Insufficient documentation

## 2022-08-04 DIAGNOSIS — E119 Type 2 diabetes mellitus without complications: Secondary | ICD-10-CM | POA: Diagnosis not present

## 2022-08-04 DIAGNOSIS — E039 Hypothyroidism, unspecified: Secondary | ICD-10-CM | POA: Diagnosis not present

## 2022-08-04 DIAGNOSIS — R002 Palpitations: Secondary | ICD-10-CM | POA: Diagnosis present

## 2022-08-04 DIAGNOSIS — I10 Essential (primary) hypertension: Secondary | ICD-10-CM | POA: Insufficient documentation

## 2022-08-04 LAB — BASIC METABOLIC PANEL
Anion gap: 13 (ref 5–15)
BUN: 13 mg/dL (ref 6–20)
CO2: 22 mmol/L (ref 22–32)
Calcium: 8.9 mg/dL (ref 8.9–10.3)
Chloride: 104 mmol/L (ref 98–111)
Creatinine, Ser: 0.77 mg/dL (ref 0.44–1.00)
GFR, Estimated: >60 mL/min (ref >60)
Glucose, Bld: 154 mg/dL — ABNORMAL HIGH (ref 70–99)
Potassium: 3.8 mmol/L (ref 3.5–5.1)
Sodium: 139 mmol/L (ref 135–145)

## 2022-08-04 LAB — CBC WITH DIFFERENTIAL/PLATELET
Abs Immature Granulocytes: 0.08 10*3/uL — ABNORMAL HIGH (ref 0.00–0.07)
Basophils Absolute: 0.1 10*3/uL (ref 0.0–0.1)
Basophils Relative: 1 %
Eosinophils Absolute: 0.3 10*3/uL (ref 0.0–0.5)
Eosinophils Relative: 3 %
HCT: 43.4 % (ref 36.0–46.0)
Hemoglobin: 14.2 g/dL (ref 12.0–15.0)
Immature Granulocytes: 1 %
Lymphocytes Relative: 35 %
Lymphs Abs: 3.7 10*3/uL (ref 0.7–4.0)
MCH: 30.1 pg (ref 26.0–34.0)
MCHC: 32.7 g/dL (ref 30.0–36.0)
MCV: 91.9 fL (ref 80.0–100.0)
Monocytes Absolute: 0.9 10*3/uL (ref 0.1–1.0)
Monocytes Relative: 8 %
Neutro Abs: 5.6 10*3/uL (ref 1.7–7.7)
Neutrophils Relative %: 52 %
Platelets: 263 10*3/uL (ref 150–400)
RBC: 4.72 MIL/uL (ref 3.87–5.11)
RDW: 13.8 % (ref 11.5–15.5)
WBC: 10.6 10*3/uL — ABNORMAL HIGH (ref 4.0–10.5)
nRBC: 0 % (ref 0.0–0.2)

## 2022-08-04 LAB — TROPONIN I (HIGH SENSITIVITY): Troponin I (High Sensitivity): 4 ng/L (ref <18)

## 2022-08-04 LAB — D-DIMER, QUANTITATIVE: D-Dimer, Quant: 0.32 ug/mL-FEU (ref 0.00–0.50)

## 2022-08-04 NOTE — ED Provider Notes (Signed)
[place] EMERGENCY DEPARTMENT AT [facility] Provider Note   CSN: [id number] Arrival date & time: 08/04/22  0449     History  Chief Complaint  Patient presents with   Irregular Heart Beat    Zoe [name] is a 52 y.o. female.  The history is provided by the patient and the EMS personnel.  Zoe [name] is a 52 y.o. female who presents to the Emergency Department complaining of palpitations.  She presents to the emergency department for evaluation of palpitations that woke her from sleep.  No associated pain.  She feels like her heart is beating very fast and started at 315 with associated shortness of breath.  No prior similar symptoms.  She used her home Kardia mobile app and noted that her heart rate was 103 when she had these palpitations.  Hx/o DM, HTN, HPL, hypothyroid No hx/o DVT/PE Family hx/o CAD on maternal side - uncles with early CAD.  No tobacco.  Takes estrogen.      Home Medications Prior to Admission medications   Medication Sig Start Date End Date Taking? Authorizing Provider  albuterol (PROAIR HFA) 108 (90 Base) MCG/ACT inhaler Inhale 2 puffs into the lungs every 6 (six) hours as needed for wheezing or shortness of breath. 07/11/22   Nelwyn Salisbury, MD  atorvastatin (LIPITOR) 40 MG tablet Take 1 tablet by mouth once daily 07/02/22   Nelwyn Salisbury, MD  Cholecalciferol (VITAMIN D3 PO) Take by mouth.    [provider]  diclofenac Sodium (VOLTAREN) 1 % GEL Apply 4 g topically 4 (four) times daily. 12/25/21   Nelwyn Salisbury, MD  estradiol (VIVELLE-DOT) 0.0375 MG/24HR 1 patch 2 (two) times a week. 01/04/22   [provider]  fluticasone (FLONASE) 50 MCG/ACT nasal spray Place 2 sprays into both nostrils daily. 07/11/22   Nelwyn Salisbury, MD  glipiZIDE (GLUCOTROL) 10 MG tablet Take 1 tablet (10 mg total) by mouth 2 (two) times daily before a meal. 07/11/22   Nelwyn Salisbury, MD  ketoconazole (NIZORAL) 2 % cream Apply 1 Application  topically 2 (two) times daily. 02/11/22   Nelwyn Salisbury, MD  Lancets [facility] DELICA PLUS [place]) MISC USE 1  TO CHECK GLUCOSE ONCE DAILY 07/25/22   Nelwyn Salisbury, MD  levocetirizine (XYZAL) 5 MG tablet TAKE 1 TABLET BY MOUTH ONCE DAILY IN THE EVENING 07/01/22   Nelwyn Salisbury, MD  levothyroxine (SYNTHROID) 100 MCG tablet Take 1 tablet (100 mcg total) by mouth daily. 07/11/22   Nelwyn Salisbury, MD  lisinopril (ZESTRIL) 10 MG tablet Take 1 tablet (10 mg total) by mouth daily. 07/11/22   Nelwyn Salisbury, MD  metFORMIN (GLUCOPHAGE) 500 MG tablet Take 1 tablet (500 mg total) by mouth 2 (two) times daily with a meal. 07/11/22   Nelwyn Salisbury, MD  montelukast (SINGULAIR) 10 MG tablet TAKE 1 TABLET BY MOUTH AT BEDTIME 05/14/22   Nelwyn Salisbury, MD  nortriptyline (PAMELOR) 50 MG capsule Take 1 capsule (50 mg total) by mouth at bedtime. 07/11/22   Nelwyn Salisbury, MD  [facility] VERIO test strip USE 1 STRIP TO CHECK GLUCOSE ONCE DAILY 01/06/22   Nelwyn Salisbury, MD  progesterone (PROMETRIUM) 100 MG capsule Take 100 mg by mouth daily. 02/18/22   [provider]  TART CHERRY PO Take by mouth.    [provider]  Turmeric (QC TUMERIC COMPLEX) 500 MG CAPS Take by mouth.    [provider]  fexofenadine (ALLEGRA) 180 MG tablet Take 180 mg by mouth daily.    05/19/11  [provider]  Norgestimate-Ethinyl Estradiol Triphasic (TRI-SPRINTEC) 0.18/0.215/0.25 MG-35 MCG tablet Take 1 tablet by mouth daily. 04/01/11 05/19/11  Nelwyn Salisbury, MD      Allergies    Molds & smuts, Other, Penicillins, Pollen extract, and Short ragweed pollen ext    Review of Systems   Review of Systems  All other systems reviewed and are negative.   Physical Exam Updated Vital Signs BP (!) 142/75 (BP Location: Right Arm)   Pulse 96   Temp 98 F (36.7 C) (Oral)   Ht 4\' 11"  (1.499 m)   Wt 98.4 kg   SpO2 100%   BMI 43.83 kg/m  Physical Exam Vitals and nursing note reviewed.  Constitutional:       Appearance: She is well-developed.  HENT:     Head: Normocephalic and atraumatic.  Cardiovascular:     Rate and Rhythm: Regular rhythm. Tachycardia present.     Heart sounds: No murmur heard. Pulmonary:     Effort: Pulmonary effort is normal. No respiratory distress.     Breath sounds: Normal breath sounds.  Abdominal:     Palpations: Abdomen is soft.     Tenderness: There is no abdominal tenderness. There is no guarding or rebound.  Musculoskeletal:        General: No swelling or tenderness.  Skin:    General: Skin is warm and dry.  Neurological:     Mental Status: She is alert and oriented to person, place, and time.  Psychiatric:        Behavior: Behavior normal.     ED Results / Procedures / Treatments   Labs (all labs ordered are listed, but only abnormal results are displayed) Labs Reviewed  BASIC METABOLIC PANEL - Abnormal; Notable for the following components:      Result Value   Glucose, Bld 154 (*)    All other components within normal limits  CBC WITH DIFFERENTIAL/PLATELET - Abnormal; Notable for the following components:   WBC 10.6 (*)    Abs Immature Granulocytes 0.08 (*)    All other components within normal limits  D-DIMER, QUANTITATIVE  TROPONIN I (HIGH SENSITIVITY)    EKG EKG Interpretation  Date/Time:  Monday August 04 2022 04:57:15 EDT Ventricular Rate:  98 PR Interval:  146 QRS Duration: 99 QT Interval:  365 QTC Calculation: 466 R Axis:   11 Text Interpretation: Sinus rhythm Low voltage, precordial leads Consider anterior infarct Confirmed by Tilden Fossa [phone]) on 08/04/2022 5:12:20 AM  Radiology DG Chest Port 1 View  Result Date: 08/04/2022 CLINICAL DATA:  Chest pain and shortness of breath EXAM: PORTABLE CHEST 1 VIEW COMPARISON:  10/04/2009 FINDINGS: Normal heart size and mediastinal contours. No acute infiltrate or edema. No effusion or pneumothorax. No acute osseous findings. Artifact from EKG leads. IMPRESSION: No evidence of active  disease. Electronically Signed   By: Tiburcio Pea M.D.   On: 08/04/2022 06:24    Procedures Procedures    Medications Ordered in ED Medications - No data to display  ED Course/ Medical Decision Making/ A&P                             Medical Decision Making Amount and/or Complexity of Data Reviewed Labs: ordered. Radiology: ordered.   Patient with history of diabetes, hypertension, hyperlipidemia here for evaluation of palpitations that woke her from sleep.  No associated chest pain.  EKG was sinus rhythm.  On record review patient with history of relative tachycardia and a mild tachycardia on multiple PCP visits.  EKG without acute ischemic changes.  She does have a negative troponin.  D-dimer was obtained given patient's estrogen use and this is negative.  She has no respiratory symptoms.  Current clinical picture is not consistent with PE.  No evidence of acute CHF on examination.  CBC with marginal leukocytosis, no evidence of acute infectious process.  No anemia.  No significant electrolyte abnormality.  On assessment in the room patient is comfortable, no acute complaints at this time.  Discussed with patient unclear source of her symptoms.  Given her extensive family history as well as risk factors will refer to cardiology for her palpitations.  Discussed close return precautions if she has progressive or new concerning symptoms.        Final Clinical Impression(s) / ED Diagnoses Final diagnoses:  Palpitations    Rx / DC Orders ED Discharge Orders          Ordered    Ambulatory referral to Cardiology        08/04/22 4098              Tilden Fossa, MD 08/04/22 [phone]

## 2022-08-04 NOTE — ED Triage Notes (Signed)
Patient presents to ed via [facility] states she was awaken by fast heart states this happened to her about 1 year ago patient states she she feels normal now other than having a headache

## 2022-08-04 NOTE — ED Notes (Signed)
IV removed. Pt verbalized understanding of discharge instructions. Pt ambulated from ed with steady gait. Pt has access to home. Pt dressed for discharge.

## 2022-08-06 ENCOUNTER — Ambulatory Visit (INDEPENDENT_AMBULATORY_CARE_PROVIDER_SITE_OTHER): Payer: BC Managed Care – PPO | Admitting: Family Medicine

## 2022-08-06 ENCOUNTER — Encounter: Payer: Self-pay | Admitting: Family Medicine

## 2022-08-06 VITALS — BP 126/72 | HR 112 | Temp 97.9°F | Wt 218.0 lb

## 2022-08-06 DIAGNOSIS — R000 Tachycardia, unspecified: Secondary | ICD-10-CM | POA: Diagnosis not present

## 2022-08-06 DIAGNOSIS — E119 Type 2 diabetes mellitus without complications: Secondary | ICD-10-CM | POA: Diagnosis not present

## 2022-08-06 DIAGNOSIS — E038 Other specified hypothyroidism: Secondary | ICD-10-CM | POA: Diagnosis not present

## 2022-08-06 LAB — T3, FREE: T3, Free: 3.3 pg/mL (ref 2.3–4.2)

## 2022-08-06 LAB — T4, FREE: Free T4: 0.81 ng/dL (ref 0.60–1.60)

## 2022-08-06 LAB — TSH: TSH: 5.61 u[IU]/mL — ABNORMAL HIGH (ref 0.35–5.50)

## 2022-08-06 LAB — HEMOGLOBIN A1C: Hgb A1c MFr Bld: 7.1 % — ABNORMAL HIGH (ref 4.6–6.5)

## 2022-08-06 MED ORDER — LEVOTHYROXINE SODIUM 125 MCG PO TABS: 125.0000 ug | ORAL_TABLET | Freq: Every day | ORAL | 3 refills | Status: DC

## 2022-08-06 MED ORDER — METFORMIN HCL 1000 MG PO TABS: 1000.0000 mg | ORAL_TABLET | Freq: Two times a day (BID) | ORAL | 3 refills | Status: DC

## 2022-08-06 MED ORDER — METOPROLOL SUCCINATE ER 50 MG PO TB24: 50.0000 mg | ORAL_TABLET | Freq: Every day | ORAL | 3 refills | Status: DC

## 2022-08-06 NOTE — Progress Notes (Signed)
   Subjective:    Patient ID: Zoe [name], female    DOB: 1970-03-01, 52 y.o.   MRN: [id number]  HPI Here to follow up on an ED visit on 08-04-22 for rapid heartbeats. She has had some mild resting tachycardia for years during visits to our clinic, but this was never a concern. Then 2 mornings ago she woke up feeling her heart racing, and she used an app on her phone to measure this at 103. She felt mildly SOB but she had no chest pain. At the ED her exam was normal other than the rapid heartbeat, and her EKG showed sinus tachycardia. Tropinin and D dymer were normal. Her CXR was clear. Since then she has felt fine. She admits to drinking 2-3 glasses of iced tea and 2-3 sodas every day. We recently increased the dose of her Levothyroxine.    Review of Systems  Constitutional: Negative.   Respiratory: Negative.    Cardiovascular:  Positive for palpitations. Negative for chest pain and leg swelling.  Neurological: Negative.        Objective:   Physical Exam Constitutional:      General: She is not in acute distress.    Appearance: Normal appearance.  Cardiovascular:     Rate and Rhythm: Regular rhythm. Tachycardia present.     Pulses: Normal pulses.     Heart sounds: Normal heart sounds.  Pulmonary:     Effort: Pulmonary effort is normal.     Breath sounds: Normal breath sounds.  Neurological:     Mental Status: She is alert.           Assessment & Plan:  Tachycardia. First of all I advised her to decrease her intake of caffeine. We will check another thyroid panel today. We will stop the Lisinopril and start her on Metoprolol succinate 50 mg daily. We will also set up an ECHO. She is scheduled to see Cardiology on 09-23-22. We spent a total of ( 32  ) minutes reviewing records and discussing these issues.  Gershon Crane, MD

## 2022-08-06 NOTE — Addendum Note (Signed)
Addended by: Johnella Moloney on: 08/06/2022 04:03 PM   Modules accepted: Orders

## 2022-08-06 NOTE — Addendum Note (Signed)
Addended by: Johnella Moloney on: 08/06/2022 04:05 PM   Modules accepted: Orders

## 2022-08-14 ENCOUNTER — Ambulatory Visit (HOSPITAL_COMMUNITY): Payer: BC Managed Care – PPO | Attending: Cardiology

## 2022-08-14 ENCOUNTER — Other Ambulatory Visit: Payer: Self-pay | Admitting: Family Medicine

## 2022-08-14 DIAGNOSIS — R9431 Abnormal electrocardiogram [ECG] [EKG]: Secondary | ICD-10-CM | POA: Diagnosis not present

## 2022-08-14 DIAGNOSIS — R000 Tachycardia, unspecified: Secondary | ICD-10-CM | POA: Insufficient documentation

## 2022-08-14 DIAGNOSIS — Z1231 Encounter for screening mammogram for malignant neoplasm of breast: Secondary | ICD-10-CM

## 2022-08-14 LAB — ECHOCARDIOGRAM COMPLETE
Area-P 1/2: 2.48 cm2
S' Lateral: 2.7 cm

## 2022-08-14 MED ORDER — PERFLUTREN LIPID MICROSPHERE
2.0000 mL | INTRAVENOUS | Status: AC | PRN
Start: 2022-08-14 — End: 2022-08-14
  Administered 2022-08-14: 2 mL via INTRAVENOUS

## 2022-08-18 ENCOUNTER — Other Ambulatory Visit: Payer: Self-pay | Admitting: Family Medicine

## 2022-09-01 LAB — HM DIABETES EYE EXAM

## 2022-09-02 ENCOUNTER — Encounter: Payer: Self-pay | Admitting: Family Medicine

## 2022-09-05 ENCOUNTER — Ambulatory Visit: Payer: BC Managed Care – PPO

## 2022-09-10 ENCOUNTER — Other Ambulatory Visit: Payer: Self-pay

## 2022-09-10 ENCOUNTER — Encounter: Payer: Self-pay | Admitting: Family Medicine

## 2022-09-10 ENCOUNTER — Ambulatory Visit: Payer: BC Managed Care – PPO | Admitting: Family Medicine

## 2022-09-10 VITALS — BP 104/78 | HR 96 | Ht 59.0 in | Wt 218.0 lb

## 2022-09-10 DIAGNOSIS — I10 Essential (primary) hypertension: Secondary | ICD-10-CM

## 2022-09-10 DIAGNOSIS — S63092D Other subluxation of left wrist and hand, subsequent encounter: Secondary | ICD-10-CM | POA: Diagnosis not present

## 2022-09-10 DIAGNOSIS — S63093D Other subluxation of unspecified wrist and hand, subsequent encounter: Secondary | ICD-10-CM

## 2022-09-10 DIAGNOSIS — E119 Type 2 diabetes mellitus without complications: Secondary | ICD-10-CM

## 2022-09-10 DIAGNOSIS — M25532 Pain in left wrist: Secondary | ICD-10-CM | POA: Diagnosis not present

## 2022-09-10 NOTE — Patient Instructions (Signed)
Thank you for coming in today.   You received an injection today. Seek immediate medical attention if the joint becomes red, extremely painful, or is oozing fluid.   Try wearing a wrist brace  Please use Voltaren gel (Generic Diclofenac Gel) up to 4x daily for pain as needed.  This is available over-the-counter as both the name brand Voltaren gel and the generic diclofenac gel.

## 2022-09-10 NOTE — Progress Notes (Signed)
   I, Stevenson Clinch, CMA acting as a scribe for Clementeen Graham, MD.  Zoe [name] is a 52 y.o. female who presents to [facility] at [facility] today for L wrist pain. Pt was previously seen by Dr. Katrinka Blazing on 07/24/22 for OMT.  Today, pt c/o L wrist pain, worsening over the past week. No known injury. Recurrent problem, sx responded well to steroid injection in the past. Pt locates pain to ulnar aspect of the wrist. Sx worse with any type of movement.  Doctors with performed a ultrasound-guided injection at the ECU tendon sheath left wrist in November 2023.  This worked until recently.  Dx imaging: 01/08/22 L wrist XR  Pertinent review of systems: No fevers or chills  Relevant historical information: Hypertension.  Diabetes. Patient is a Midwife.  Exam:  BP 104/78   Pulse 96   Ht 4\' 11"  (1.499 m)   Wt 218 lb (98.9 kg)   SpO2 99%   BMI 44.03 kg/m  General: Well Developed, well nourished, and in no acute distress.   MSK: Left wrist normal appearing. Tender palpation ulnar dorsal wrist at ECU tendon.  Some pain present with resisted wrist extension and ulnar deviation.    Lab and Radiology Results  Procedure: Real-time Ultrasound Guided Injection of left wrist ECU tendon sheath Device: Philips Affiniti 50G/GE Logiq Images permanently stored and available for review in PACS Verbal informed consent obtained.  Discussed risks and benefits of procedure. Warned about infection, bleeding, hyperglycemia damage to structures among others. Patient expresses understanding and agreement Time-out conducted.   Noted no overlying erythema, induration, or other signs of local infection.   Skin prepped in a sterile fashion.   Local anesthesia: Topical Ethyl chloride.   With sterile technique and under real time ultrasound guidance: 40 mg of Kenalog and 1 mL of lidocaine injected into ECU tendon sheath. Fluid seen entering the tendon sheath.   Completed without  difficulty   Pain immediately resolved suggesting accurate placement of the medication.   Advised to call if fevers/chills, erythema, induration, drainage, or persistent bleeding.   Images permanently stored and available for review in the ultrasound unit.  Impression: Technically successful ultrasound guided injection.         Assessment and Plan: 52 y.o. female with left wrist pain.  This is a recurrence of a chronic problem.  Plan for steroid injection at ECU tendon sheath today.  Previous injection was November 2023.  Plan for wrist brace and Voltaren gel.  Check back as needed with Dr. Katrinka Blazing or myself. She has a scheduled appoint with Dr. Katrinka Blazing at the end of this month.  Avoid high-dose oral NSAIDs with hypertension and diabetes. PDMP not reviewed this encounter. Orders Placed This Encounter  Procedures   [place] LIMITED JOINT SPACE STRUCTURES UP LEFT(NO LINKED CHARGES)    Order Specific Question:   Reason for Exam (SYMPTOM  OR DIAGNOSIS REQUIRED)    Answer:   left wrist pain    Order Specific Question:   Preferred imaging location?    Answer:   [facility]   No orders of the defined types were placed in this encounter.    Discussed warning signs or symptoms. Please see discharge instructions. Patient expresses understanding.   The above documentation has been reviewed and is accurate and complete Clementeen Graham, M.D.

## 2022-09-17 NOTE — Progress Notes (Signed)
[name] [address] [place] [address] Phone: [phone] Subjective:   INadine Counts, am serving as a scribe for Dr. Antoine Primas.  I'm seeing this patient by the request  of:  Nelwyn Salisbury, MD  CC: Back pain and neck pain follow-up  [id number]  Zoe [name] is a 52 y.o. female coming in with complaint of back and neck pain. OMT 07/24/2022. Also saw Dr. Denyse Amass recently for L wrist pain. Patient states doing well. Says wrist is doing a lot better since seeing Dr. Denyse Amass. No new concerns.  Medications patient has been prescribed: None  Taking:         Reviewed prior external information including notes and imaging from previsou exam, outside providers and external EMR if available.   As well as notes that were available from care everywhere and other healthcare systems.  Past medical history, social, surgical and family history all reviewed in electronic medical record.  No pertanent information unless stated regarding to the chief complaint.   Past Medical History:  Diagnosis Date   Allergy    Anemia    Benign hematuria    worked up by Dr. Ihor Gully   Chickenpox    Horseshoe kidney    Hx of migraine headaches    Hyperlipidemia    Hypertension    Menorrhagia    resolved with ablation   Osteoarthritis    patient denies this dx   SVD (spontaneous vaginal delivery)    x 1   Thyroid disease     Allergies  Allergen Reactions   Molds & Smuts Other (See Comments)   Other Other (See Comments)   Penicillins Hives   Pollen Extract Other (See Comments)   Short Ragweed Pollen Ext Other (See Comments)     Review of Systems:  No headache, visual changes, nausea, vomiting, diarrhea, constipation, dizziness, abdominal pain, skin rash, fevers, chills, night sweats, weight loss, swollen lymph nodes, body aches, joint swelling, chest pain, shortness of breath, mood changes. POSITIVE muscle aches  Objective  Blood  pressure 110/72, pulse (!) 103, height 4\' 11"  (1.499 m), weight 216 lb (98 kg), SpO2 98%.   General: No apparent distress alert and oriented x3 mood and affect normal, dressed appropriately.  HEENT: Pupils equal, extraocular movements intact  Respiratory: Patient's speak in full sentences and does not appear short of breath  Cardiovascular: No lower extremity edema, non tender, no erythema  Back exam does have some loss lordosis noted.  Some tenderness to palpation noted more in the parascapular area less to the left.  Some limitation in right-sided sidebending of the neck.  Osteopathic findings  C2 flexed rotated and side bent right C5 flexed rotated and side bent right T3 extended rotated and side bent right inhaled rib T8 extended rotated and side bent left L1 flexed rotated and side bent right L5 flexed rotated and side bent left Sacrum right on right       Assessment and Plan:  No problem-specific Assessment & Plan notes found for this encounter.    Nonallopathic problems  Decision today to treat with OMT was based on Physical Exam  After verbal consent patient was treated with HVLA, ME, FPR techniques in cervical, rib, thoracic, lumbar, and sacral  areas  Patient tolerated the procedure well with improvement in symptoms  Patient given exercises, stretches and lifestyle modifications  See medications in patient instructions if given  Patient will follow up in 4-8 weeks  The above documentation has been reviewed and is accurate and complete Judi Saa, DO         Note: This dictation was prepared with Dragon dictation along with smaller phrase technology. Any transcriptional errors that result from this process are unintentional.

## 2022-09-18 ENCOUNTER — Encounter: Payer: Self-pay | Admitting: Family Medicine

## 2022-09-18 ENCOUNTER — Ambulatory Visit: Payer: BC Managed Care – PPO | Admitting: Family Medicine

## 2022-09-18 VITALS — BP 110/72 | HR 103 | Ht 59.0 in | Wt 216.0 lb

## 2022-09-18 DIAGNOSIS — M545 Low back pain, unspecified: Secondary | ICD-10-CM

## 2022-09-18 DIAGNOSIS — M9904 Segmental and somatic dysfunction of sacral region: Secondary | ICD-10-CM | POA: Diagnosis not present

## 2022-09-18 DIAGNOSIS — M9902 Segmental and somatic dysfunction of thoracic region: Secondary | ICD-10-CM | POA: Diagnosis not present

## 2022-09-18 DIAGNOSIS — M9903 Segmental and somatic dysfunction of lumbar region: Secondary | ICD-10-CM

## 2022-09-18 DIAGNOSIS — M9908 Segmental and somatic dysfunction of rib cage: Secondary | ICD-10-CM | POA: Diagnosis not present

## 2022-09-18 DIAGNOSIS — M9901 Segmental and somatic dysfunction of cervical region: Secondary | ICD-10-CM

## 2022-09-18 DIAGNOSIS — G8929 Other chronic pain: Secondary | ICD-10-CM

## 2022-09-18 NOTE — Patient Instructions (Signed)
Good to see you! Think you're doing much better See you again in 2-3 months

## 2022-09-18 NOTE — Assessment & Plan Note (Signed)
Multifactorial, we discussed posture and ergonomics, discussed core strengthening.  Increase activity slowly.  Follow-up again in 6 to 8 weeks

## 2022-09-19 ENCOUNTER — Ambulatory Visit
Admission: RE | Admit: 2022-09-19 | Discharge: 2022-09-19 | Disposition: A | Discharge status: Home / Self Care | Payer: BC Managed Care – PPO | Source: Ambulatory Visit | Attending: Family Medicine | Admitting: Family Medicine

## 2022-09-19 DIAGNOSIS — Z1231 Encounter for screening mammogram for malignant neoplasm of breast: Secondary | ICD-10-CM

## 2022-09-22 NOTE — Progress Notes (Unsigned)
Cardiology Office Note:    Date:  09/23/2022   ID:  Zoe [name], DOB 27-May-1970, MRN [id number]  PCP:  Nelwyn Salisbury, MD   [facility] Providers Cardiologist:  None     Referring MD: Nelwyn Salisbury, MD   No chief complaint on file. Palpitations  History of Present Illness:    Zoe [name] is a 52 y.o. female with a hx of thyroid dx, TSH 5.6 , had normal free T4/T3.Her PCP Dr. Clent Ridges increased her synthroid.   Zoe [name] presented with complaints of racing heartbeats that woke her up, prompting an ER visit in June where a high resting heart rate was noted. She recently had an increase in Synthroid dosage and is awaiting a repeat thyroid function test in September.    Past Medical History:  Diagnosis Date   Allergy    Anemia    Benign hematuria    worked up by Dr. Ihor Gully   Chickenpox    Horseshoe kidney    Hx of migraine headaches    Hyperlipidemia    Hypertension    Menorrhagia    resolved with ablation   Osteoarthritis    patient denies this dx   SVD (spontaneous vaginal delivery)    x 1   Thyroid disease     Past Surgical History:  Procedure Laterality Date   CESAREAN SECTION     x 2   CHOLECYSTECTOMY     COLONOSCOPY  11/16/2017   per Dr. Rhea Belton, sigmoid diverticulosis, no polyps, repeat in 5 yrs    SHOULDER SURGERY Right    arthroscopy   TUBAL LIGATION     TYMPANOSTOMY      Current Medications: Current Meds  Medication Sig   albuterol (PROAIR HFA) 108 (90 Base) MCG/ACT inhaler Inhale 2 puffs into the lungs every 6 (six) hours as needed for wheezing or shortness of breath.   atorvastatin (LIPITOR) 40 MG tablet Take 1 tablet by mouth once daily   Cholecalciferol (VITAMIN D3 PO) Take by mouth.   diclofenac Sodium (VOLTAREN) 1 % GEL Apply 4 g topically 4 (four) times daily.   estradiol (VIVELLE-DOT) 0.0375 MG/24HR 1 patch 2 (two) times a week.   fluticasone (FLONASE) 50 MCG/ACT nasal spray Place 2 sprays into both nostrils daily.    glipiZIDE (GLUCOTROL) 10 MG tablet Take 1 tablet (10 mg total) by mouth 2 (two) times daily before a meal.   ketoconazole (NIZORAL) 2 % cream Apply 1 Application topically 2 (two) times daily.   Lancets (ONETOUCH DELICA PLUS LANCET33G) MISC USE 1  TO CHECK GLUCOSE ONCE DAILY   levocetirizine (XYZAL) 5 MG tablet TAKE 1 TABLET BY MOUTH ONCE DAILY IN THE EVENING   levothyroxine (SYNTHROID) 125 MCG tablet Take 1 tablet (125 mcg total) by mouth daily.   metFORMIN (GLUCOPHAGE) 1000 MG tablet Take 1 tablet (1,000 mg total) by mouth 2 (two) times daily with a meal.   metoprolol succinate (TOPROL-XL) 50 MG 24 hr tablet Take 1 tablet (50 mg total) by mouth daily. Take with or immediately following a meal.   montelukast (SINGULAIR) 10 MG tablet TAKE 1 TABLET BY MOUTH AT BEDTIME   nortriptyline (PAMELOR) 50 MG capsule Take 1 capsule (50 mg total) by mouth at bedtime.   ONETOUCH VERIO test strip USE 1 STRIP TO CHECK GLUCOSE ONCE DAILY   progesterone (PROMETRIUM) 100 MG capsule Take 100 mg by mouth daily.   TART CHERRY PO Take by mouth.   Turmeric (QC TUMERIC COMPLEX)  500 MG CAPS Take by mouth.   Current Facility-Administered Medications for the 09/23/22 encounter (Office Visit) with Maisie Fus, MD  Medication   0.9 %  sodium chloride infusion     Allergies:   Molds & smuts, Other, Penicillins, Pollen extract, and Short ragweed pollen ext   Social History   Socioeconomic History   Marital status: Divorced    Spouse name: Not on file   Number of children: 3   Years of education: 12+   Highest education level: Some college, no degree  Occupational History    Employer: [employer]  Tobacco Use   Smoking status: Never   Smokeless tobacco: Never  Vaping Use   Vaping status: Never Used  Substance and Sexual Activity   Alcohol use: No    Alcohol/week: 0.0 standard drinks of alcohol   Drug use: No   Sexual activity: Yes    Birth control/protection: None    Comment: ablation   Other Topics Concern   Not on file  Social History Narrative   Patient is married.     Patient has 3 children.    Patient lives with parents.    Patient is employed with [employer] and [employer]    Patient has some college.    Social Determinants of Health   Financial Resource Strain: Low Risk  (03/02/2022)   Overall Financial Resource Strain (CARDIA)    Difficulty of Paying Living Expenses: Not very hard  Food Insecurity: Patient Declined (03/02/2022)   Hunger Vital Sign    Worried About Running Out of Food in the Last Year: Patient declined    Ran Out of Food in the Last Year: Patient declined  Transportation Needs: No Transportation Needs (03/02/2022)   PRAPARE - Administrator, Civil Service (Medical): No    Lack of Transportation (Non-Medical): No  Physical Activity: Unknown (03/02/2022)   Exercise Vital Sign    Days of Exercise per Week: Patient declined    Minutes of Exercise per Session: Not on file  Stress: No Stress Concern Present (03/02/2022)   [employer] of Occupational Health - Occupational Stress Questionnaire    Feeling of Stress : Not at all  Social Connections: Unknown (03/02/2022)   Social Connection and Isolation Panel [NHANES]    Frequency of Communication with Friends and Family: More than three times a week    Frequency of Social Gatherings with Friends and Family: More than three times a week    Attends Religious Services: More than 4 times per year    Active Member of [employer] or Organizations: Patient declined    Attends Engineer, structural: Not on file    Marital Status: Divorced     Family History: The patient's family history includes Arthritis in an other family member; Colon polyps in her brother; Coronary artery disease in an other family member; Depression in an other family member; Diabetes in an other family member; Hyperlipidemia in an other family member; Sudden death in an other family member. There is no history of Colon cancer, Rectal  cancer, or Stomach cancer.  ROS:   Please see the history of present illness.     All other systems reviewed and are negative.  EKGs/Labs/Other Studies Reviewed:    The following studies were reviewed today:       Recent Labs: 07/11/2022: ALT 40 08/04/2022: BUN 13; Creatinine, Ser 0.77; Hemoglobin 14.2; Platelets 263; Potassium 3.8; Sodium 139 08/06/2022: TSH 5.61  Recent Lipid Panel  Component Value Date/Time   CHOL 173 07/11/2022 0823   TRIG 262.0 (H) 07/11/2022 0823   HDL 43.80 07/11/2022 0823   CHOLHDL 4 07/11/2022 0823   VLDL 52.4 (H) 07/11/2022 0823   LDLCALC 80 07/09/2021 1141   LDLDIRECT 101.0 07/11/2022 0823     Risk Assessment/Calculations:     Physical Exam:    VS:   Vitals:   09/23/22 0840  BP: 112/80  Pulse: 93  SpO2: 95%     Wt Readings from Last 3 Encounters:  09/23/22 220 lb 6.4 oz (100 kg)  09/18/22 216 lb (98 kg)  09/10/22 218 lb (98.9 kg)     GEN:  Well nourished, well developed in no acute distress HEENT: Normal NECK: No JVD; No carotid bruits LYMPHATICS: No lymphadenopathy CARDIAC: RRR, no murmurs, rubs, gallops RESPIRATORY:  Clear to auscultation without rales, wheezing or rhonchi  ABDOMEN: Soft, non-tender, non-distended MUSCULOSKELETAL:  No edema; No deformity  SKIN: Warm and dry NEUROLOGIC:  Alert and oriented x 3 PSYCHIATRIC:  Normal affect   ASSESSMENT:   Palpitations Elevated TSH -pending repeat, had normal T3/T4 - will assess for arrhythmia  PLAN:    In order of problems listed above:  Continue BB 3 day ziopatch I recommend reducing caffeine      Medication Adjustments/Labs and Tests Ordered: Current medicines are reviewed at length with the patient today.  Concerns regarding medicines are outlined above.  Orders Placed This Encounter  Procedures   LONG TERM MONITOR (3-14 DAYS)   No orders of the defined types were placed in this encounter.   Patient Instructions  Medication Instructions:  Your  physician recommends that you continue on your current medications as directed. Please refer to the Current Medication list given to you today.  *If you need a refill on your cardiac medications before your next appointment, please call your pharmacy*   Lab Work: NONE If you have labs (blood work) drawn today and your tests are completely normal, you will receive your results only by: MyChart Message (if you have MyChart) OR A paper copy in the mail If you have any lab test that is abnormal or we need to change your treatment, we will call you to review the results.   Testing/Procedures: [name]- Long Term Monitor Instructions  Your physician has requested you wear a ZIO patch monitor for 3 days.  This is a single patch monitor. [employer] supplies one patch monitor per enrollment. Additional stickers are not available. Please do not apply patch if you will be having a Nuclear Stress Test,  Echocardiogram, Cardiac CT, MRI, or Chest Xray during the period you would be wearing the  monitor. The patch cannot be worn during these tests. You cannot remove and re-apply the  ZIO XT patch monitor.  Your ZIO patch monitor will be mailed 3 day USPS to your address on file. It may take 3-5 days  to receive your monitor after you have been enrolled.  Once you have received your monitor, please review the enclosed instructions. Your monitor  has already been registered assigning a specific monitor serial # to you.  Billing and Patient Assistance Program Information  We have supplied [employer] with any of your insurance information on file for billing purposes. [employer] offers a sliding scale Patient Assistance Program for patients that do not have  insurance, or whose insurance does not completely cover the cost of the ZIO monitor.  You must apply for the Patient Assistance Program to qualify for this discounted  rate.  To apply, please call [employer] at [phone], select option 4, select option 2, ask  to apply for  Patient Assistance Program. Meredeth Ide will ask your household income, and how many people  are in your household. They will quote your out-of-pocket cost based on that information.  [employer] will also be able to set up a 9-month, interest-free payment plan if needed.  Applying the monitor   Shave hair from upper left chest.  Hold abrader disc by orange tab. Rub abrader in 40 strokes over the upper left chest as  indicated in your monitor instructions.  Clean area with 4 enclosed alcohol pads. Let dry.  Apply patch as indicated in monitor instructions. Patch will be placed under collarbone on left  side of chest with arrow pointing upward.  Rub patch adhesive wings for 2 minutes. Remove white label marked "1". Remove the white  label marked "2". Rub patch adhesive wings for 2 additional minutes.  While looking in a mirror, press and release button in center of patch. A small green light will  flash 3-4 times. This will be your only indicator that the monitor has been turned on.  Do not shower for the first 24 hours. You may shower after the first 24 hours.  Press the button if you feel a symptom. You will hear a small click. Record Date, Time and  Symptom in the Patient Logbook.  When you are ready to remove the patch, follow instructions on the last 2 pages of Patient  Logbook. Stick patch monitor onto the last page of Patient Logbook.  Place Patient Logbook in the blue and white box. Use locking tab on box and tape box closed  securely. The blue and white box has prepaid postage on it. Please place it in the mailbox as  soon as possible. Your physician should have your test results approximately 7 days after the  monitor has been mailed back to [facility].  Call [facility] Customer Care at [phone] if you have questions regarding  your ZIO XT patch monitor. Call them immediately if you see an orange light blinking on your  monitor.  If your monitor falls off in  less than 4 days, contact our Monitor department at [phone].  If your monitor becomes loose or falls off after 4 days call [employer] at [phone] for  suggestions on securing your monitor    Follow-Up: At [facility], you and your health needs are our priority.  As part of our continuing mission to provide you with exceptional heart care, we have created designated Provider Care Teams.  These Care Teams include your primary Cardiologist (physician) and Advanced Practice Providers (APPs -  Physician Assistants and Nurse Practitioners) who all work together to provide you with the care you need, when you need it.  We recommend signing up for the patient portal called "MyChart".  Sign up information is provided on this After Visit Summary.  MyChart is used to connect with patients for Virtual Visits (Telemedicine).  Patients are able to view lab/test results, encounter notes, upcoming appointments, etc.  Non-urgent messages can be sent to your provider as well.   To learn more about what you can do with MyChart, go to [email or web address].    Your next appointment:   AS NEEDED   Provider:   DR. [facility]    Signed, Maisie Fus, MD  09/23/2022 8:59 AM    [facility]

## 2022-09-23 ENCOUNTER — Encounter: Payer: Self-pay | Admitting: Internal Medicine

## 2022-09-23 ENCOUNTER — Ambulatory Visit: Payer: BC Managed Care – PPO | Admitting: Internal Medicine

## 2022-09-23 ENCOUNTER — Ambulatory Visit (INDEPENDENT_AMBULATORY_CARE_PROVIDER_SITE_OTHER): Payer: BC Managed Care – PPO

## 2022-09-23 VITALS — BP 112/80 | HR 93 | Ht 59.0 in | Wt 220.4 lb

## 2022-09-23 DIAGNOSIS — R002 Palpitations: Secondary | ICD-10-CM | POA: Diagnosis not present

## 2022-09-23 NOTE — Patient Instructions (Addendum)
Medication Instructions:  Your physician recommends that you continue on your current medications as directed. Please refer to the Current Medication list given to you today.  *If you need a refill on your cardiac medications before your next appointment, please call your pharmacy*   Lab Work: NONE If you have labs (blood work) drawn today and your tests are completely normal, you will receive your results only by: MyChart Message (if you have MyChart) OR A paper copy in the mail If you have any lab test that is abnormal or we need to change your treatment, we will call you to review the results.   Testing/Procedures: Christena Deem- Long Term Monitor Instructions  Your physician has requested you wear a ZIO patch monitor for 3 days.  This is a single patch monitor. [employer] supplies one patch monitor per enrollment. Additional stickers are not available. Please do not apply patch if you will be having a Nuclear Stress Test,  Echocardiogram, Cardiac CT, MRI, or Chest Xray during the period you would be wearing the  monitor. The patch cannot be worn during these tests. You cannot remove and re-apply the  ZIO XT patch monitor.  Your ZIO patch monitor will be mailed 3 day USPS to your address on file. It may take 3-5 days  to receive your monitor after you have been enrolled.  Once you have received your monitor, please review the enclosed instructions. Your monitor  has already been registered assigning a specific monitor serial # to you.  Billing and Patient Assistance Program Information  We have supplied [employer] with any of your insurance information on file for billing purposes. [employer] offers a sliding scale Patient Assistance Program for patients that do not have  insurance, or whose insurance does not completely cover the cost of the ZIO monitor.  You must apply for the Patient Assistance Program to qualify for this discounted rate.  To apply, please call [employer] at [phone], select  option 4, select option 2, ask to apply for  Patient Assistance Program. Meredeth Ide will ask your household income, and how many people  are in your household. They will quote your out-of-pocket cost based on that information.  [employer] will also be able to set up a 73-month, interest-free payment plan if needed.  Applying the monitor   Shave hair from upper left chest.  Hold abrader disc by orange tab. Rub abrader in 40 strokes over the upper left chest as  indicated in your monitor instructions.  Clean area with 4 enclosed alcohol pads. Let dry.  Apply patch as indicated in monitor instructions. Patch will be placed under collarbone on left  side of chest with arrow pointing upward.  Rub patch adhesive wings for 2 minutes. Remove white label marked "1". Remove the white  label marked "2". Rub patch adhesive wings for 2 additional minutes.  While looking in a mirror, press and release button in center of patch. A small green light will  flash 3-4 times. This will be your only indicator that the monitor has been turned on.  Do not shower for the first 24 hours. You may shower after the first 24 hours.  Press the button if you feel a symptom. You will hear a small click. Record Date, Time and  Symptom in the Patient Logbook.  When you are ready to remove the patch, follow instructions on the last 2 pages of Patient  Logbook. Stick patch monitor onto the last page of Patient Logbook.  Place Patient Logbook in  the blue and white box. Use locking tab on box and tape box closed  securely. The blue and white box has prepaid postage on it. Please place it in the mailbox as  soon as possible. Your physician should have your test results approximately 7 days after the  monitor has been mailed back to [facility].  Call [facility] Customer Care at [phone] if you have questions regarding  your ZIO XT patch monitor. Call them immediately if you see an orange light blinking on your  monitor.   If your monitor falls off in less than 4 days, contact our Monitor department at [phone].  If your monitor becomes loose or falls off after 4 days call [employer] at [phone] for  suggestions on securing your monitor    Follow-Up: At [facility], you and your health needs are our priority.  As part of our continuing mission to provide you with exceptional heart care, we have created designated Provider Care Teams.  These Care Teams include your primary Cardiologist (physician) and Advanced Practice Providers (APPs -  Physician Assistants and Nurse Practitioners) who all work together to provide you with the care you need, when you need it.  We recommend signing up for the patient portal called "MyChart".  Sign up information is provided on this After Visit Summary.  MyChart is used to connect with patients for Virtual Visits (Telemedicine).  Patients are able to view lab/test results, encounter notes, upcoming appointments, etc.  Non-urgent messages can be sent to your provider as well.   To learn more about what you can do with MyChart, go to [email or web address].    Your next appointment:   AS NEEDED   Provider:   DR. [facility]

## 2022-09-23 NOTE — Progress Notes (Unsigned)
Enrolled for [employer] to mail a ZIO XT long term holter monitor to the patients address on file.  

## 2022-09-26 DIAGNOSIS — R002 Palpitations: Secondary | ICD-10-CM | POA: Diagnosis not present

## 2022-09-27 ENCOUNTER — Other Ambulatory Visit: Payer: Self-pay | Admitting: Family Medicine

## 2022-09-28 ENCOUNTER — Other Ambulatory Visit: Payer: Self-pay | Admitting: Family Medicine

## 2022-09-28 DIAGNOSIS — E785 Hyperlipidemia, unspecified: Secondary | ICD-10-CM

## 2022-10-05 IMAGING — DX DG LUMBAR SPINE 2-3V
3 series · 3 of 3 positions shown · non-contrast
Comparison: None.

CLINICAL DATA: Chronic back pain

EXAM:
LUMBAR SPINE - 2-3 VIEW

[l-spine ap]
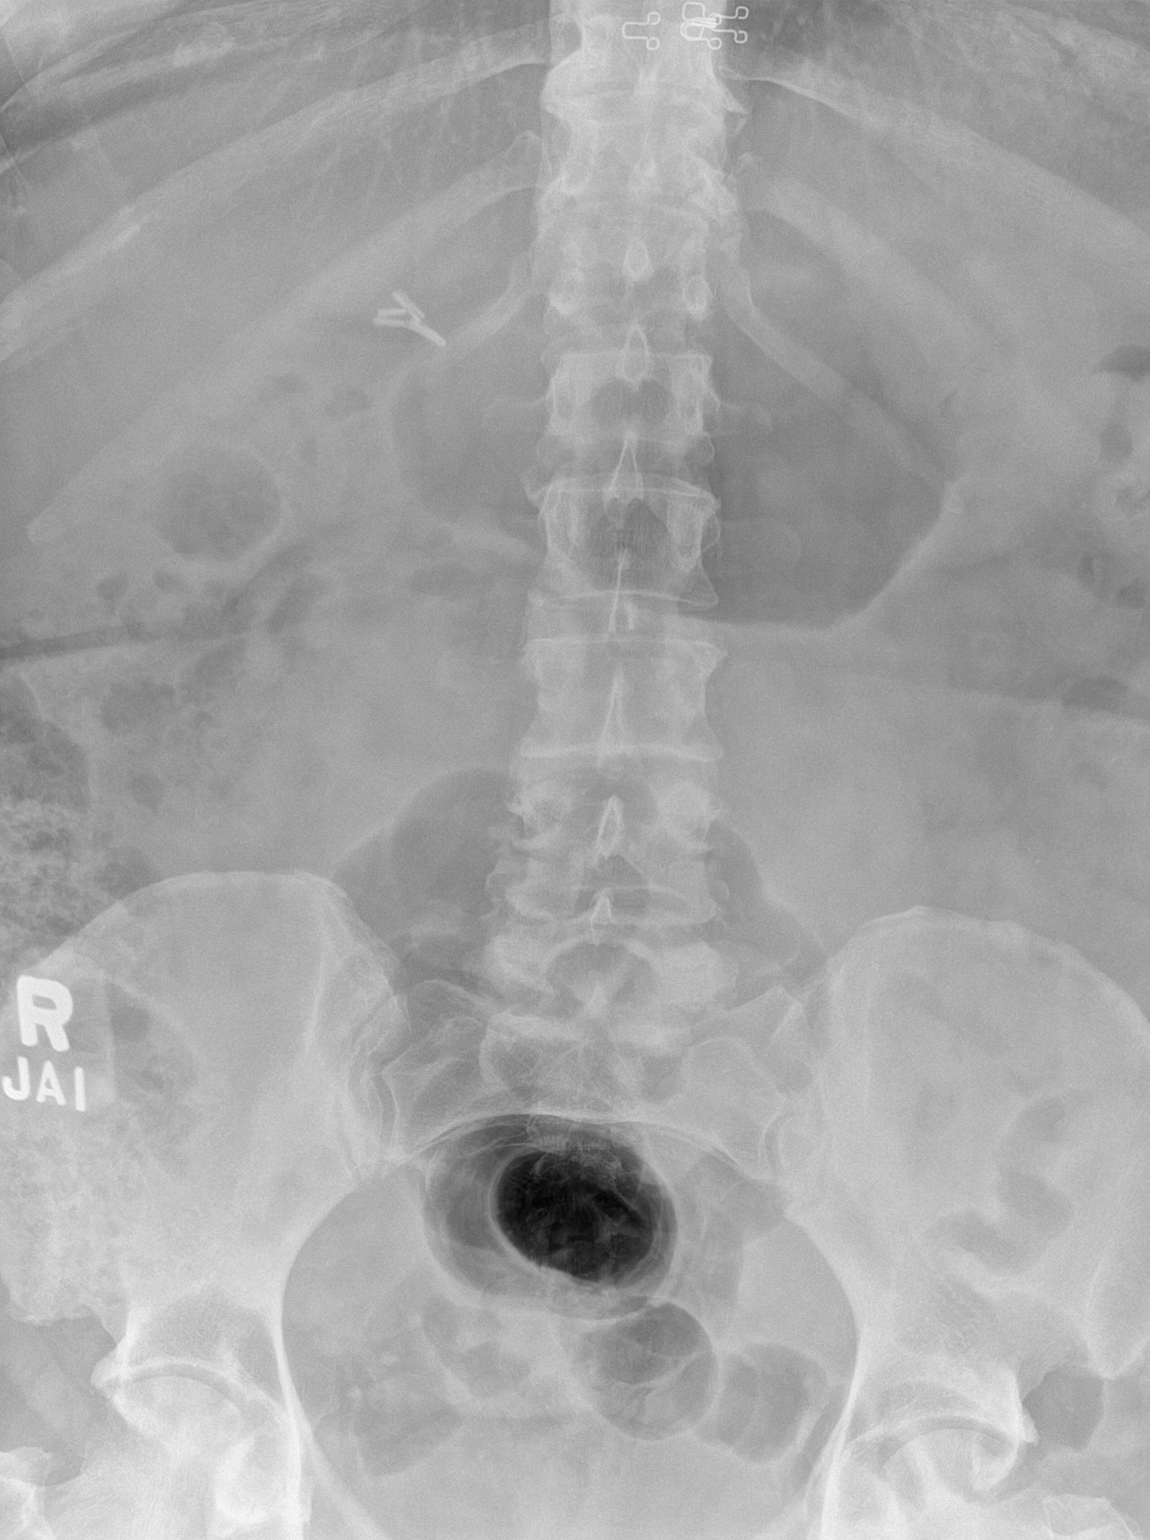

[l-spine lateral]
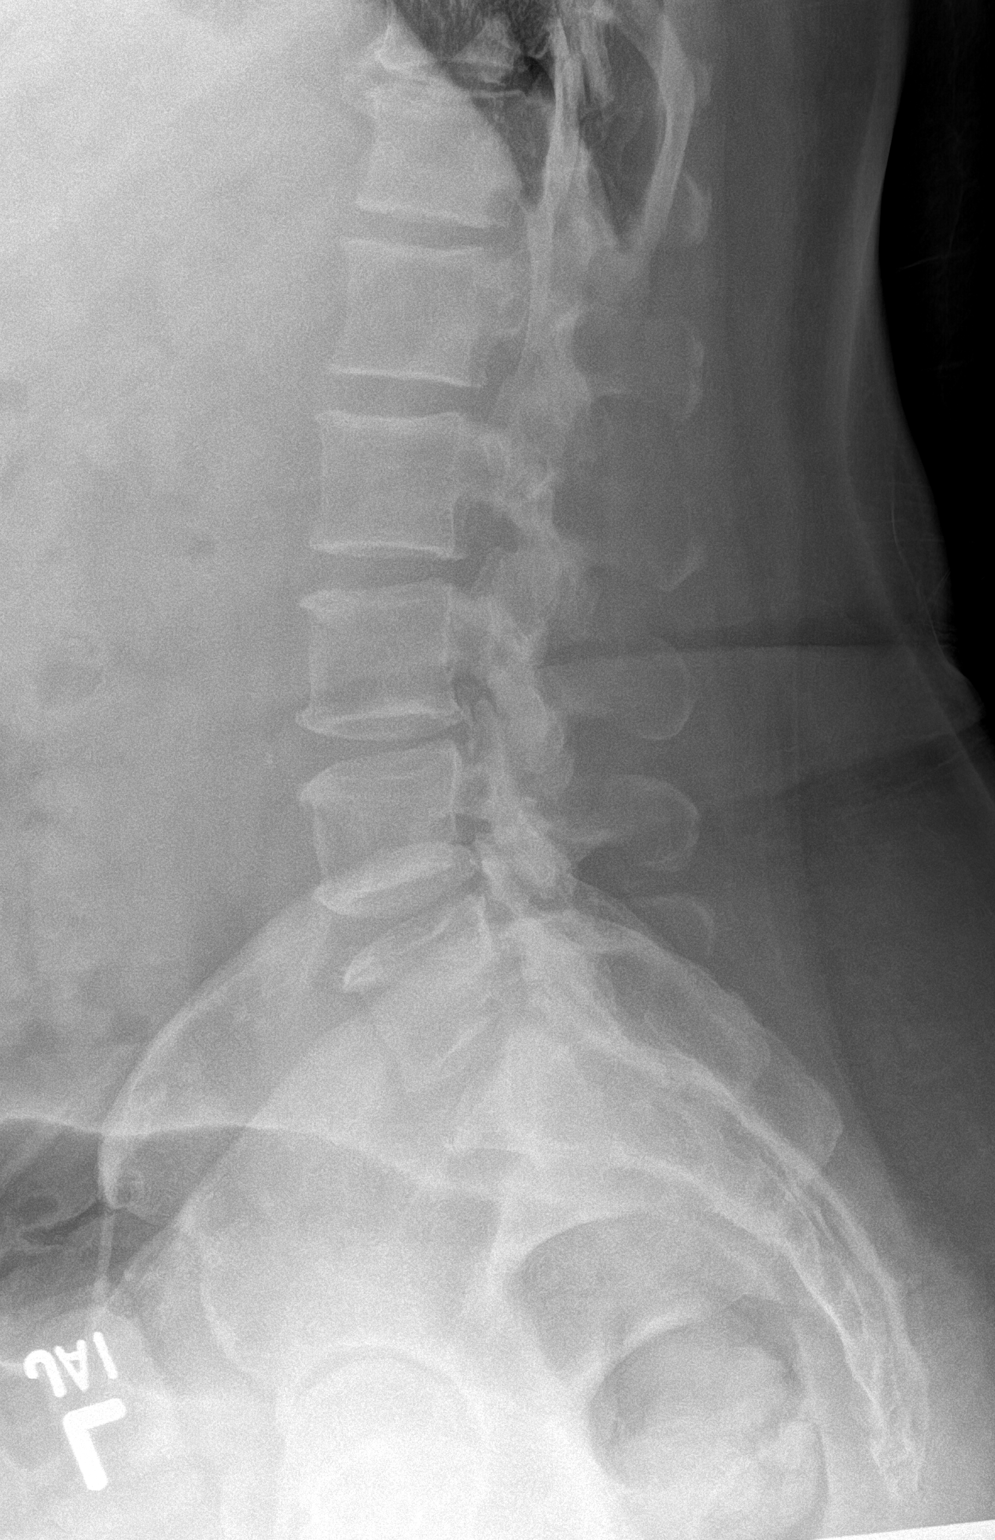

[l-spine spot]
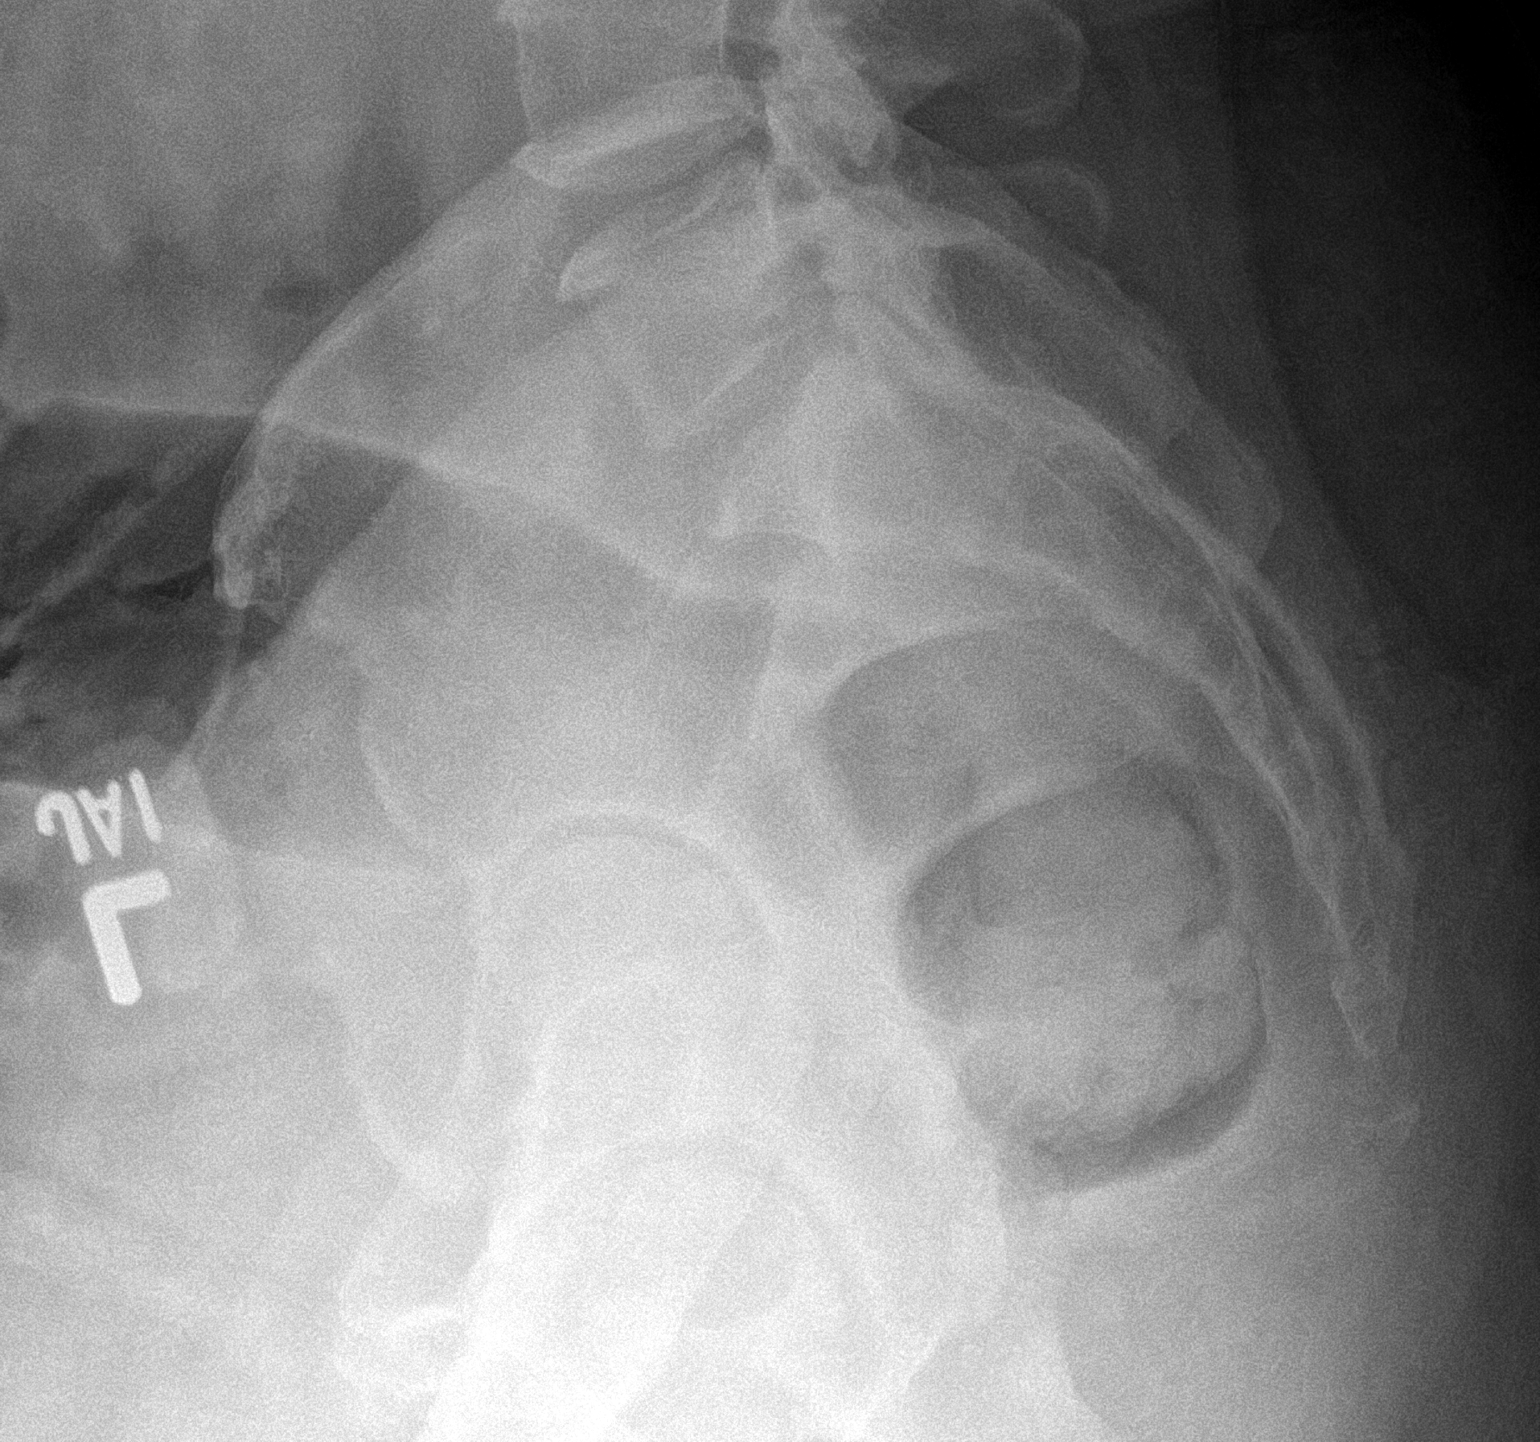

[3 of 3 positions shown; findings below may reference images not displayed]

FINDINGS: No recent fracture is seen. Alignment of posterior margins of
vertebral bodies is unremarkable. Degenerative changes are noted
with bony spurs and facet hypertrophy, more so at L4-L5 and L5-S1
levels. Pedicles appear intact. Surgical clips are seen in
gallbladder fossa.
IMPRESSION: No recent fracture is seen in the lumbar spine. Lumbar spondylosis,
more severe at L4-L5 and L5-S1 levels.

## 2022-10-10 ENCOUNTER — Other Ambulatory Visit: Payer: BC Managed Care – PPO

## 2022-10-14 ENCOUNTER — Encounter: Payer: Self-pay | Admitting: Internal Medicine

## 2022-10-20 ENCOUNTER — Other Ambulatory Visit: Payer: Self-pay | Admitting: Family Medicine

## 2022-11-12 ENCOUNTER — Other Ambulatory Visit: Payer: BC Managed Care – PPO

## 2022-11-12 ENCOUNTER — Telehealth: Payer: Self-pay | Admitting: Family Medicine

## 2022-11-12 DIAGNOSIS — E119 Type 2 diabetes mellitus without complications: Secondary | ICD-10-CM

## 2022-11-12 DIAGNOSIS — E038 Other specified hypothyroidism: Secondary | ICD-10-CM

## 2022-11-12 LAB — TSH: TSH: 2.52 u[IU]/mL (ref 0.35–5.50)

## 2022-11-12 LAB — HEMOGLOBIN A1C: Hgb A1c MFr Bld: 7.1 % — ABNORMAL HIGH (ref 4.6–6.5)

## 2022-11-12 LAB — T3, FREE: T3, Free: 3.2 pg/mL (ref 2.3–4.2)

## 2022-11-12 LAB — T4, FREE: Free T4: 1.02 ng/dL (ref 0.60–1.60)

## 2022-11-12 MED ORDER — ONETOUCH VERIO VI STRP: ORAL_STRIP | 1 refills | Status: DC

## 2022-11-12 NOTE — Telephone Encounter (Signed)
Prescription Request  11/12/2022  LOV: 08/06/2022  What is the name of the medication or equipment?  ONETOUCH VERIO test strip   Have you contacted your pharmacy to request a refill? Yes   Which pharmacy would you like this sent to?  [facility] [address] [place] - [address] [place] [address] Phone: [phone] Fax: [phone]    Patient notified that their request is being sent to the clinical staff for review and that they should receive a response within 2 business days.   Please advise at Mobile [phone] (mobile)

## 2022-11-12 NOTE — Telephone Encounter (Signed)
Refill sent to requested pharmacy.

## 2022-11-16 ENCOUNTER — Other Ambulatory Visit: Payer: Self-pay | Admitting: Family Medicine

## 2022-11-19 NOTE — Progress Notes (Unsigned)
[facility] [address] [place] [address] Phone: [phone] Subjective:   Zoe [name], Zoe serving as a scribe for Dr. Antoine Primas.  I'm seeing this patient by the request  of:  Nelwyn Salisbury, Zoe  CC: Back and neck pain follow-up  [id number]  Zoe [name] is a 52 y.o. female coming in with complaint of back and neck pain. OMT 09/18/2022. Patient states same per usual.  Patient is unfortunately been lifting boxes.  Feels like she does have some tightness noted in the back.  Seems to be more the mid back than usual.  Concerned because it is seeming to affect daily activities.  Medications patient has been prescribed: None  Taking:         Reviewed prior external information including notes and imaging from previsou exam, outside providers and external EMR if available.   As well as notes that were available from care everywhere and other healthcare systems.  Past medical history, social, surgical and family history all reviewed in electronic medical record.  No pertanent information unless stated regarding to the chief complaint.   Past Medical History:  Diagnosis Date   Allergy    Anemia    Benign hematuria    worked up by Dr. Ihor Gully   Chickenpox    Horseshoe kidney    Hx of migraine headaches    Hyperlipidemia    Hypertension    Menorrhagia    resolved with ablation   Osteoarthritis    patient denies this dx   SVD (spontaneous vaginal delivery)    x 1   Thyroid disease     Allergies  Allergen Reactions   Molds & Smuts Other (See Comments)   Other Other (See Comments)   Penicillins Hives   Pollen Extract Other (See Comments)   Short Ragweed Pollen Ext Other (See Comments)     Review of Systems:  No headache, visual changes, nausea, vomiting, diarrhea, constipation, dizziness, abdominal pain, skin rash, fevers, chills, night sweats, weight loss, swollen lymph nodes, body aches, joint swelling, chest  pain, shortness of breath, mood changes. POSITIVE muscle aches  Objective  Blood pressure 130/86, pulse 93, height 4\' 11"  (1.499 m), weight 225 lb (102.1 kg), SpO2 97%.   General: No apparent distress alert and oriented x3 mood and affect normal, dressed appropriately.  HEENT: Pupils equal, extraocular movements intact  Respiratory: Patient's speak in full sentences and does not appear short of breath  Cardiovascular: No lower extremity edema, non tender, no erythema  Back does have severe tightness noted today.  Limited range of motion in all planes.  Seems to be more of a muscle spasm today.  Osteopathic findings  C2 flexed rotated and side bent right C6 flexed rotated and side bent left T3 extended rotated and side bent right inhaled rib T9 extended rotated and side bent left T11 flexed rotated and side bent right L1 flexed rotated and side bent left L2 flexed rotated and side bent right Sacrum right on right    Assessment and Plan:  Low back pain Muscle spasm noted today.  Toradol and Depo-Medrol injection given secondary to the tightness noted.  Discussed icing regimen and home exercises, discussed which activities to do and which ones to avoid.  Discussed increasing activity slowly.  Follow-up again in 6 to 8 weeks worsening pain to seek medical attention immediately.    Nonallopathic problems  Decision today to treat with OMT was based on Physical Exam  After verbal consent patient was treated with HVLA, ME, FPR techniques in cervical, rib, thoracic, lumbar, and sacral  areas  Patient tolerated the procedure well with improvement in symptoms  Patient given exercises, stretches and lifestyle modifications  See medications in patient instructions if given  Patient will follow up in 4-8 weeks    The above documentation has been reviewed and is accurate and complete Zoe Saa, DO          Note: This dictation was prepared with Dragon dictation along with  smaller phrase technology. Any transcriptional errors that result from this process are unintentional.

## 2022-11-20 ENCOUNTER — Ambulatory Visit: Payer: BC Managed Care – PPO | Admitting: Family Medicine

## 2022-11-20 ENCOUNTER — Encounter: Payer: Self-pay | Admitting: Family Medicine

## 2022-11-20 VITALS — BP 130/86 | HR 93 | Ht 59.0 in | Wt 225.0 lb

## 2022-11-20 DIAGNOSIS — M9901 Segmental and somatic dysfunction of cervical region: Secondary | ICD-10-CM | POA: Diagnosis not present

## 2022-11-20 DIAGNOSIS — M545 Low back pain, unspecified: Secondary | ICD-10-CM

## 2022-11-20 DIAGNOSIS — M9903 Segmental and somatic dysfunction of lumbar region: Secondary | ICD-10-CM | POA: Diagnosis not present

## 2022-11-20 DIAGNOSIS — G8929 Other chronic pain: Secondary | ICD-10-CM

## 2022-11-20 DIAGNOSIS — M9904 Segmental and somatic dysfunction of sacral region: Secondary | ICD-10-CM | POA: Diagnosis not present

## 2022-11-20 DIAGNOSIS — M9908 Segmental and somatic dysfunction of rib cage: Secondary | ICD-10-CM

## 2022-11-20 DIAGNOSIS — M9902 Segmental and somatic dysfunction of thoracic region: Secondary | ICD-10-CM

## 2022-11-20 MED ORDER — METHYLPREDNISOLONE ACETATE 80 MG/ML IJ SUSP
80.0000 mg | Freq: Once | INTRAMUSCULAR | Status: AC
Start: 2022-11-20 — End: 2022-11-20
  Administered 2022-11-20: 80 mg via INTRAMUSCULAR

## 2022-11-20 MED ORDER — KETOROLAC TROMETHAMINE 60 MG/2ML IM SOLN
60.0000 mg | Freq: Once | INTRAMUSCULAR | Status: AC
Start: 2022-11-20 — End: 2022-11-20
  Administered 2022-11-20: 60 mg via INTRAMUSCULAR

## 2022-11-20 NOTE — Patient Instructions (Signed)
Cocktail injection today See you again in 6 weeks

## 2022-11-20 NOTE — Assessment & Plan Note (Signed)
Muscle spasm noted today.  Toradol and Depo-Medrol injection given secondary to the tightness noted.  Discussed icing regimen and home exercises, discussed which activities to do and which ones to avoid.  Discussed increasing activity slowly.  Follow-up again in 6 to 8 weeks worsening pain to seek medical attention immediately.

## 2022-11-25 ENCOUNTER — Encounter: Payer: Self-pay | Admitting: Family Medicine

## 2022-11-28 ENCOUNTER — Encounter: Payer: Self-pay | Admitting: Family Medicine

## 2022-12-05 ENCOUNTER — Other Ambulatory Visit: Payer: Self-pay

## 2022-12-05 ENCOUNTER — Telehealth: Payer: Self-pay | Admitting: Family Medicine

## 2022-12-05 DIAGNOSIS — E785 Hyperlipidemia, unspecified: Secondary | ICD-10-CM

## 2022-12-05 MED ORDER — ATORVASTATIN CALCIUM 40 MG PO TABS: 40.0000 mg | ORAL_TABLET | Freq: Every day | ORAL | 0 refills | Status: DC

## 2022-12-05 MED ORDER — METFORMIN HCL 1000 MG PO TABS: 1000.0000 mg | ORAL_TABLET | Freq: Two times a day (BID) | ORAL | 3 refills | Status: DC

## 2022-12-05 NOTE — Telephone Encounter (Signed)
Prescription Request  12/05/2022  LOV: 08/06/2022  What is the name of the medication or equipment?   Pt would like to request to resume Rx for alprazolam and is requesting 90 day supplies of:   atorvastatin (LIPITOR) 40 MG tablet & metoprolol succinate (TOPROL-XL) 50 MG 24 hr tablet Have you contacted your pharmacy to request a refill? No   Pt has requested to change her pharmacy, as well.   Which pharmacy would you like this sent to?   [facility] [phone] - [place],  -  [address] AT [facility] OF [address] & [address] [place] [address] Phone: [phone] Fax: [phone]   Patient notified that their request is being sent to the clinical staff for review and that they should receive a response within 2-3 business days.   Please advise at Mobile [phone] (mobile)

## 2022-12-09 NOTE — Telephone Encounter (Signed)
Both Rx sent

## 2022-12-18 ENCOUNTER — Encounter: Payer: Self-pay | Admitting: Family Medicine

## 2022-12-18 ENCOUNTER — Ambulatory Visit: Payer: BC Managed Care – PPO | Admitting: Family Medicine

## 2022-12-18 VITALS — BP 108/74 | HR 106 | Temp 98.6°F | Ht 59.0 in | Wt 221.8 lb

## 2022-12-18 DIAGNOSIS — J0140 Acute pansinusitis, unspecified: Secondary | ICD-10-CM | POA: Diagnosis not present

## 2022-12-18 DIAGNOSIS — R0981 Nasal congestion: Secondary | ICD-10-CM

## 2022-12-18 LAB — POCT INFLUENZA A/B
Influenza A, POC: NEGATIVE
Influenza B, POC: NEGATIVE

## 2022-12-18 LAB — POC COVID19 BINAXNOW: SARS Coronavirus 2 Ag: NEGATIVE

## 2022-12-18 MED ORDER — DOXYCYCLINE HYCLATE 100 MG PO TABS
100.0000 mg | ORAL_TABLET | Freq: Two times a day (BID) | ORAL | 0 refills | Status: AC
Start: 2022-12-18 — End: 2022-12-25

## 2022-12-18 NOTE — Progress Notes (Signed)
   Established Patient Office Visit   Subjective  Patient ID: Zoe [name], female    DOB: 1971-01-01  Age: 52 y.o. MRN: [id number]  Chief Complaint  Patient presents with   Cough    Started 4 weeks ago, cough, congestion, headache, sinus pressure     Patient is a 52 year old female followed by Dr. Clent Ridges and seen for ongoing concern.  Patient endorses sinus pressure, congestion, headache off and on x 4 weeks.  Patient states symptoms initially started after being around increased dust while moving.  Symptoms improved for few days to return this morning with headache, and increased pressure.  Patient taking Xyzal, Singulair, using Nettie pot.  Previously received allergy shots.  Cough      Review of Systems  Respiratory:  Positive for cough.    Negative unless stated above    Objective:     BP 108/74 (BP Location: Left Arm, Patient Position: Sitting, Cuff Size: Large)   Pulse (!) 106   Temp 98.6 F (37 C) (Oral)   Ht 4\' 11"  (1.499 m)   Wt 221 lb 12.8 oz (100.6 kg)   LMP  (LMP Unknown)   SpO2 97%   BMI 44.80 kg/m    Physical Exam Constitutional:      General: She is not in acute distress.    Appearance: Normal appearance.  HENT:     Head: Normocephalic and atraumatic.     Right Ear: Tympanic membrane normal.     Left Ear: Tympanic membrane is erythematous.     Nose:     Right Turbinates: Enlarged and swollen.     Left Turbinates: Enlarged and swollen.     Right Sinus: Maxillary sinus tenderness and frontal sinus tenderness present.     Left Sinus: Maxillary sinus tenderness and frontal sinus tenderness present.     Mouth/Throat:     Mouth: Mucous membranes are moist.  Cardiovascular:     Rate and Rhythm: Normal rate and regular rhythm.     Heart sounds: Normal heart sounds. No murmur heard.    No gallop.  Pulmonary:     Effort: Pulmonary effort is normal. No respiratory distress.     Breath sounds: Normal breath sounds. No wheezing, rhonchi or rales.   Skin:    General: Skin is warm and dry.  Neurological:     Mental Status: She is alert and oriented to person, place, and time.      Results for orders placed or performed in visit on 12/18/22  POC COVID-19 BinaxNow  Result Value Ref Range   SARS Coronavirus 2 Ag Negative Negative  POC Influenza A/B  Result Value Ref Range   Influenza A, POC Negative Negative   Influenza B, POC Negative Negative      Assessment & Plan:  Subacute pansinusitis -     Doxycycline Hyclate; Take 1 tablet (100 mg total) by mouth 2 (two) times daily for 7 days.  Dispense: 14 tablet; Refill: 0  Nasal congestion -     POC COVID-19 BinaxNow -     POCT Influenza A/B  Intermittent URI symptoms x 4 weeks.  POC COVID and flu testing negative in clinic.  Allergy to PCN- hives.  Start doxy for sinusitis.  Consider switching Xyzal to a different OTC antihistamine.  Continue nasal spray, Nettie pot, other OTC cough/cold medications.  Consider restarting allergy shots.  Return if symptoms worsen or fail to improve.   Deeann Saint, MD

## 2022-12-19 ENCOUNTER — Ambulatory Visit (AMBULATORY_SURGERY_CENTER): Payer: BC Managed Care – PPO

## 2022-12-19 VITALS — Ht 59.0 in | Wt 220.0 lb

## 2022-12-19 DIAGNOSIS — Z860100 Personal history of colon polyps, unspecified: Secondary | ICD-10-CM

## 2022-12-19 MED ORDER — NA SULFATE-K SULFATE-MG SULF 17.5-3.13-1.6 GM/177ML PO SOLN: 1.0000 | Freq: Once | ORAL | 0 refills | Status: AC

## 2022-12-19 NOTE — Progress Notes (Signed)
Pre visit completed via phone call; Patient verified name, DOB, and address; No egg or soy allergy known to patient;  No issues known to pt with past sedation with any surgeries or procedures; Patient denies ever being told they had issues or difficulty with intubation;  No FH of Malignant Hyperthermia; Pt is not on diet pills; Pt is not on home 02;  Pt is not on blood thinners; Pt reports issues with constipation -patient reports she  No A fib or A flutter; Have any cardiac testing pending--NO Insurance verified during PV appt--- BCBS State Pt can ambulate without assistance;  Pt denies use of chewing tobacco; Discussed diabetic/weight loss medication holds; Discussed NSAID holds; Checked BMI to be less than 50; Pt instructed to use Single[employer] or GoodRx for a price reduction on prep;  Patient's chart reviewed by Cathlyn Parsons CNRA prior to previsit and patient appropriate for the [facility];  Pre visit completed and red dot placed by patient's name on their procedure day (on provider's schedule);   Instructions sent to MyChart as well as printed and mailed to the patient per her request;

## 2022-12-25 ENCOUNTER — Other Ambulatory Visit: Payer: Self-pay | Admitting: Medical Genetics

## 2022-12-25 DIAGNOSIS — Z006 Encounter for examination for normal comparison and control in clinical research program: Secondary | ICD-10-CM

## 2023-01-05 ENCOUNTER — Encounter: Payer: Self-pay | Admitting: Internal Medicine

## 2023-01-06 NOTE — Progress Notes (Unsigned)
[name] [address] [place] [address] Phone: [phone] Subjective:   Zoe [name], am serving as a scribe for Dr. Antoine Primas.  I'm seeing this patient by the request  of:  Nelwyn Salisbury, MD  CC: neck and back pain follow up  [id number]  Zoe [name] is a 52 y.o. female coming in with complaint of back and neck pain. OMT on 11/20/2022. Patient states same per usual. No new concerns. Has been doing relatively well overall.  No pain that has stopped her from any activity.  Still aware of her back pain all the time.         Reviewed prior external information including notes and imaging from previsou exam, outside providers and external EMR if available.   As well as notes that were available from care everywhere and other healthcare systems.  Past medical history, social, surgical and family history all reviewed in electronic medical record.  No pertanent information unless stated regarding to the chief complaint.   Past Medical History:  Diagnosis Date   Allergy    Anemia    Benign hematuria    worked up by Dr. Ihor Gully   Chickenpox    Horseshoe kidney    Hx of migraine headaches    Hyperlipidemia    Hypertension    Menorrhagia    resolved with ablation   Osteoarthritis    patient denies this dx   SVD (spontaneous vaginal delivery)    x 1   Thyroid disease     Allergies  Allergen Reactions   Molds & Smuts Other (See Comments)   Penicillins Hives   Pollen Extract Other (See Comments)   Short Ragweed Pollen Ext Other (See Comments)     Review of Systems:  No headache, visual changes, nausea, vomiting, diarrhea, constipation, dizziness, abdominal pain, skin rash, fevers, chills, night sweats, weight loss, swollen lymph nodes, body aches, joint swelling, chest pain, shortness of breath, mood changes. POSITIVE muscle aches  Objective  Blood pressure (!) 136/90, pulse (!) 102, height 4\' 11"  (1.499 m),  weight 227 lb (103 kg), SpO2 97%.   General: No apparent distress alert and oriented x3 mood and affect normal, dressed appropriately.  HEENT: Pupils equal, extraocular movements intact  Respiratory: Patient's speak in full sentences and does not appear short of breath  Cardiovascular: No lower extremity edema, non tender, no erythema  MSK:  Back low back does have some loss lordosis noted.  Still poor core strength noted.  Some improvement in hip abductor strengthening noted.  Neurovascular intact distally  Osteopathic findings  C2 flexed rotated and side bent right C6 flexed rotated and side bent left T3 extended rotated and side bent right inhaled rib T9 extended rotated and side bent left L2 flexed rotated and side bent right Sacrum right on right       Assessment and Plan:  Low back pain Chronic problem that likely has responded relatively well to osteopathic manipulation.  Continue to work on core strength and weight loss.  Would like patient's BMI to be at least under 40 and ideally under 35.  Patient will continue to work hard.  No change in medications at the moment.  Follow-up again in 6 to 8 weeks.    Nonallopathic problems  Decision today to treat with OMT was based on Physical Exam  After verbal consent patient was treated with HVLA, ME, FPR techniques in cervical, rib, thoracic, lumbar, and sacral  areas  Patient tolerated the procedure well with improvement in symptoms  Patient given exercises, stretches and lifestyle modifications  See medications in patient instructions if given  Patient will follow up in 4-8 weeks    The above documentation has been reviewed and is accurate and complete Judi Saa, DO          Note: This dictation was prepared with Dragon dictation along with smaller phrase technology. Any transcriptional errors that result from this process are unintentional.

## 2023-01-07 ENCOUNTER — Ambulatory Visit (INDEPENDENT_AMBULATORY_CARE_PROVIDER_SITE_OTHER): Payer: BC Managed Care – PPO | Admitting: Family Medicine

## 2023-01-07 ENCOUNTER — Encounter: Payer: Self-pay | Admitting: Family Medicine

## 2023-01-07 VITALS — BP 136/90 | HR 102 | Ht 59.0 in | Wt 227.0 lb

## 2023-01-07 DIAGNOSIS — M9903 Segmental and somatic dysfunction of lumbar region: Secondary | ICD-10-CM | POA: Diagnosis not present

## 2023-01-07 DIAGNOSIS — M9902 Segmental and somatic dysfunction of thoracic region: Secondary | ICD-10-CM | POA: Diagnosis not present

## 2023-01-07 DIAGNOSIS — M545 Low back pain, unspecified: Secondary | ICD-10-CM

## 2023-01-07 DIAGNOSIS — M9901 Segmental and somatic dysfunction of cervical region: Secondary | ICD-10-CM

## 2023-01-07 DIAGNOSIS — M9904 Segmental and somatic dysfunction of sacral region: Secondary | ICD-10-CM

## 2023-01-07 DIAGNOSIS — M9908 Segmental and somatic dysfunction of rib cage: Secondary | ICD-10-CM

## 2023-01-07 DIAGNOSIS — G8929 Other chronic pain: Secondary | ICD-10-CM

## 2023-01-07 NOTE — Assessment & Plan Note (Signed)
Chronic problem that likely has responded relatively well to osteopathic manipulation.  Continue to work on core strength and weight loss.  Would like patient's BMI to be at least under 40 and ideally under 35.  Patient will continue to work hard.  No change in medications at the moment.  Follow-up again in 6 to 8 weeks.

## 2023-01-07 NOTE — Patient Instructions (Signed)
Good to see you! Hopefully you can have another vacation like you described See you again in 8 weeks

## 2023-01-08 ENCOUNTER — Other Ambulatory Visit (HOSPITAL_COMMUNITY)
Admission: RE | Admit: 2023-01-08 | Discharge: 2023-01-08 | Disposition: A | Discharge status: Home / Self Care | Payer: Self-pay | Source: Ambulatory Visit | Attending: Oncology | Admitting: Oncology

## 2023-01-08 ENCOUNTER — Encounter: Payer: Self-pay | Admitting: Internal Medicine

## 2023-01-08 DIAGNOSIS — Z006 Encounter for examination for normal comparison and control in clinical research program: Secondary | ICD-10-CM | POA: Insufficient documentation

## 2023-01-14 ENCOUNTER — Ambulatory Visit: Payer: BC Managed Care – PPO | Admitting: Internal Medicine

## 2023-01-14 ENCOUNTER — Encounter: Payer: Self-pay | Admitting: Internal Medicine

## 2023-01-14 VITALS — BP 116/65 | HR 89 | Temp 97.9°F | Resp 17 | Ht 59.0 in | Wt 220.0 lb

## 2023-01-14 DIAGNOSIS — Z860101 Personal history of adenomatous and serrated colon polyps: Secondary | ICD-10-CM

## 2023-01-14 DIAGNOSIS — D124 Benign neoplasm of descending colon: Secondary | ICD-10-CM

## 2023-01-14 DIAGNOSIS — Z860100 Personal history of colon polyps, unspecified: Secondary | ICD-10-CM

## 2023-01-14 DIAGNOSIS — K635 Polyp of colon: Secondary | ICD-10-CM | POA: Diagnosis not present

## 2023-01-14 DIAGNOSIS — Z1211 Encounter for screening for malignant neoplasm of colon: Secondary | ICD-10-CM | POA: Diagnosis present

## 2023-01-14 DIAGNOSIS — K5730 Diverticulosis of large intestine without perforation or abscess without bleeding: Secondary | ICD-10-CM | POA: Diagnosis not present

## 2023-01-14 MED ORDER — SODIUM CHLORIDE 0.9 % IV SOLN: 500.0000 mL | Freq: Once | INTRAVENOUS | Status: DC

## 2023-01-14 NOTE — Progress Notes (Signed)
Report to PACU, RN, vss, BBS= Clear.  

## 2023-01-14 NOTE — Op Note (Signed)
[facility] Patient Name: Zoe [name] Procedure Date: 01/14/2023 11:27 AM MRN: [id number] Endoscopist: Beverley Fiedler , MD, [id number] Age: 52 Referring MD:  Date of Birth: 24-Jan-1971 Gender: Female Account #: [id number] Procedure:                Colonoscopy Indications:              High risk colon cancer surveillance: Personal                            history of non-advanced adenoma, Last colonoscopy:                            September 2019 (no polyps), 2014 (adenoma x 1) Medicines:                Monitored Anesthesia Care Procedure:                Pre-Anesthesia Assessment:                           - Prior to the procedure, a History and Physical                            was performed, and patient medications and                            allergies were reviewed. The patient's tolerance of                            previous anesthesia was also reviewed. The risks                            and benefits of the procedure and the sedation                            options and risks were discussed with the patient.                            All questions were answered, and informed consent                            was obtained. Prior Anticoagulants: The patient has                            taken no anticoagulant or antiplatelet agents. ASA                            Grade Assessment: III - A patient with severe                            systemic disease. After reviewing the risks and                            benefits, the patient was deemed in satisfactory  condition to undergo the procedure.                           After obtaining informed consent, the colonoscope                            was passed under direct vision. Throughout the                            procedure, the patient's blood pressure, pulse, and                            oxygen saturations were monitored continuously. The                            Olympus  Scope [id number] was introduced through the                            anus and advanced to the cecum, identified by                            appendiceal orifice and ileocecal valve. The                            colonoscopy was performed without difficulty. The                            patient tolerated the procedure well. The quality                            of the bowel preparation was excellent. The                            ileocecal valve, appendiceal orifice, and rectum                            were photographed. Scope In: 11:36:58 AM Scope Out: 11:50:55 AM Scope Withdrawal Time: 0 hours 9 minutes 39 seconds  Total Procedure Duration: 0 hours 13 minutes 57 seconds  Findings:                 The digital rectal exam was normal.                           A 4 mm polyp was found in the descending colon. The                            polyp was sessile. The polyp was removed with a                            cold snare. Resection and retrieval were complete.                           A few small-mouthed diverticula were found in the  sigmoid colon.                           External and internal hemorrhoids were found during                            retroflexion. The hemorrhoids were small. Complications:            No immediate complications. Estimated Blood Loss:     Estimated blood loss: none. Impression:               - One 4 mm polyp in the descending colon, removed                            with a cold snare. Resected and retrieved.                           - Mild diverticulosis in the sigmoid colon.                           - Small external and internal hemorrhoids. Recommendation:           - Patient has a contact number available for                            emergencies. The signs and symptoms of potential                            delayed complications were discussed with the                            patient. Return to normal  activities tomorrow.                            Written discharge instructions were provided to the                            patient.                           - Resume previous diet.                           - Continue present medications.                           - Await pathology results.                           - Repeat colonoscopy date to be determined after                            pending pathology results are reviewed for                            surveillance. Beverley Fiedler, MD 01/14/2023 11:53:25 AM This report has been signed electronically.

## 2023-01-14 NOTE — Patient Instructions (Signed)
Resume previous diet and medications. Awaiting pathology results. Repeat Colonoscopy date to be determined based on pathology results.  YOU HAD AN ENDOSCOPIC PROCEDURE TODAY AT THE [facility]:   Refer to the procedure report that was given to you for any specific questions about what was found during the examination.  If the procedure report does not answer your questions, please call your gastroenterologist to clarify.  If you requested that your care partner not be given the details of your procedure findings, then the procedure report has been included in a sealed envelope for you to review at your convenience later.  YOU SHOULD EXPECT: Some feelings of bloating in the abdomen. Passage of more gas than usual.  Walking can help get rid of the air that was put into your GI tract during the procedure and reduce the bloating. If you had a lower endoscopy (such as a colonoscopy or flexible sigmoidoscopy) you may notice spotting of blood in your stool or on the toilet paper. If you underwent a bowel prep for your procedure, you may not have a normal bowel movement for a few days.  Please Note:  You might notice some irritation and congestion in your nose or some drainage.  This is from the oxygen used during your procedure.  There is no need for concern and it should clear up in a day or so.  SYMPTOMS TO REPORT IMMEDIATELY:  Following lower endoscopy (colonoscopy or flexible sigmoidoscopy):  Excessive amounts of blood in the stool  Significant tenderness or worsening of abdominal pains  Swelling of the abdomen that is new, acute  Fever of 100F or higher   For urgent or emergent issues, a gastroenterologist can be reached at any hour by calling [phone]. Do not use MyChart messaging for urgent concerns.    DIET:  We do recommend a small meal at first, but then you may proceed to your regular diet.  Drink plenty of fluids but you should avoid alcoholic beverages for 24  hours.  ACTIVITY:  You should plan to take it easy for the rest of today and you should NOT DRIVE or use heavy machinery until tomorrow (because of the sedation medicines used during the test).    FOLLOW UP: Our staff will call the number listed on your records the next business day following your procedure.  We will call around 7:15- 8:00 am to check on you and address any questions or concerns that you may have regarding the information given to you following your procedure. If we do not reach you, we will leave a message.     If any biopsies were taken you will be contacted by phone or by letter within the next 1-3 weeks.  Please call us at [phone] if you have not heard about the biopsies in 3 weeks.    SIGNATURES/CONFIDENTIALITY: You and/or your care partner have signed paperwork which will be entered into your electronic medical record.  These signatures attest to the fact that that the information above on your After Visit Summary has been reviewed and is understood.  Full responsibility of the confidentiality of this discharge information lies with you and/or your care-partner. 

## 2023-01-14 NOTE — Progress Notes (Signed)
Called to room to assist during endoscopic procedure.  Patient ID and intended procedure confirmed with present staff. Received instructions for my participation in the procedure from the performing physician.  

## 2023-01-14 NOTE — Progress Notes (Signed)
GASTROENTEROLOGY PROCEDURE H&P NOTE   Primary Care Physician: Nelwyn Salisbury, MD    Reason for Procedure:  History of adenomatous colon polyp  Plan:    Colonoscopy  Patient is appropriate for endoscopic procedure(s) in the ambulatory ([facility]) setting.  The nature of the procedure, as well as the risks, benefits, and alternatives were carefully and thoroughly reviewed with the patient. Ample time for discussion and questions allowed. The patient understood, was satisfied, and agreed to proceed.     HPI: Zoe [name] is a 52 y.o. female who presents for surveillance colonoscopy.  Medical history as below.  Tolerated the prep.  No recent chest pain or shortness of breath.  No abdominal pain today.  Past Medical History:  Diagnosis Date   Allergy    Anemia    Benign hematuria    worked up by Dr. Ihor Gully   Chickenpox    Horseshoe kidney    Hx of migraine headaches    Hyperlipidemia    Hypertension    Menorrhagia    resolved with ablation   Osteoarthritis    patient denies this dx   SVD (spontaneous vaginal delivery)    x 1   Thyroid disease     Past Surgical History:  Procedure Laterality Date   CESAREAN SECTION     x 2   CHOLECYSTECTOMY     COLONOSCOPY  11/16/2017   per Dr. Rhea Belton, sigmoid diverticulosis, no polyps, repeat in 5 yrs    SHOULDER SURGERY Right    arthroscopy   TUBAL LIGATION     TYMPANOSTOMY      Prior to Admission medications   Medication Sig Start Date End Date Taking? Authorizing Provider  albuterol (PROAIR HFA) 108 (90 Base) MCG/ACT inhaler Inhale 2 puffs into the lungs every 6 (six) hours as needed for wheezing or shortness of breath. 07/11/22  Yes Nelwyn Salisbury, MD  atorvastatin (LIPITOR) 40 MG tablet Take 1 tablet (40 mg total) by mouth daily. 12/05/22  Yes Nelwyn Salisbury, MD  Cholecalciferol (VITAMIN D3 PO) Take 1 tablet by mouth daily at 6 (six) AM.   Yes [provider]  estradiol (VIVELLE-DOT) 0.0375 MG/24HR 1 patch 2  (two) times a week. 01/04/22  Yes [provider]  fluticasone (FLONASE) 50 MCG/ACT nasal spray Place 2 sprays into both nostrils daily. 07/11/22  Yes Nelwyn Salisbury, MD  glipiZIDE (GLUCOTROL) 10 MG tablet Take 1 tablet (10 mg total) by mouth 2 (two) times daily before a meal. 07/11/22  Yes Nelwyn Salisbury, MD  glucose blood [facility] VERIO) test strip Use as instructed 11/12/22  Yes Nelwyn Salisbury, MD  Lancets [facility] DELICA PLUS [place]) MISC USE AS DIRECTED ONCE DAILY TO  CHECK  GLUCOSE 10/22/22  Yes Nelwyn Salisbury, MD  Lancets [facility] DELICA PLUS [place]) MISC USE 1  TO CHECK GLUCOSE ONCE DAILY 10/22/22  Yes Nelwyn Salisbury, MD  levocetirizine (XYZAL) 5 MG tablet TAKE 1 TABLET BY MOUTH ONCE DAILY IN THE EVENING 09/29/22  Yes Nelwyn Salisbury, MD  levothyroxine (SYNTHROID) 125 MCG tablet Take 1 tablet (125 mcg total) by mouth daily. 08/06/22  Yes Nelwyn Salisbury, MD  metFORMIN (GLUCOPHAGE) 1000 MG tablet Take 1 tablet (1,000 mg total) by mouth 2 (two) times daily with a meal. 12/05/22  Yes Nelwyn Salisbury, MD  metoprolol succinate (TOPROL-XL) 50 MG 24 hr tablet TAKE 1 TABLET BY MOUTH ONCE DAILY. TAKE WITH OR IMMEDIATELY FOLLOWING A MEAL 11/17/22  Yes Clent Ridges,  Tera Mater, MD  montelukast (SINGULAIR) 10 MG tablet TAKE 1 TABLET BY MOUTH AT BEDTIME 10/22/22  Yes Nelwyn Salisbury, MD  nortriptyline (PAMELOR) 50 MG capsule Take 1 capsule (50 mg total) by mouth at bedtime. 07/11/22  Yes Nelwyn Salisbury, MD  progesterone (PROMETRIUM) 100 MG capsule Take 100 mg by mouth daily. 02/18/22  Yes [provider]  TART CHERRY PO Take 1 capsule by mouth.   Yes [provider]  Turmeric (QC TUMERIC COMPLEX) 500 MG CAPS Take 1 capsule by mouth daily at 6 (six) AM.   Yes [provider]  diclofenac Sodium (VOLTAREN) 1 % GEL Apply 4 g topically 4 (four) times daily. 12/25/21   Nelwyn Salisbury, MD  ketoconazole (NIZORAL) 2 % cream Apply 1 Application topically 2 (two) times daily. 02/11/22   Nelwyn Salisbury, MD  fexofenadine (ALLEGRA) 180 MG tablet Take 180 mg by mouth daily.    05/19/11  [provider]  Norgestimate-Ethinyl Estradiol Triphasic (TRI-SPRINTEC) 0.18/0.215/0.25 MG-35 MCG tablet Take 1 tablet by mouth daily. 04/01/11 05/19/11  Nelwyn Salisbury, MD    Current Outpatient Medications  Medication Sig Dispense Refill   albuterol (PROAIR HFA) 108 (90 Base) MCG/ACT inhaler Inhale 2 puffs into the lungs every 6 (six) hours as needed for wheezing or shortness of breath. 1 each 11   atorvastatin (LIPITOR) 40 MG tablet Take 1 tablet (40 mg total) by mouth daily. 90 tablet 0   Cholecalciferol (VITAMIN D3 PO) Take 1 tablet by mouth daily at 6 (six) AM.     estradiol (VIVELLE-DOT) 0.0375 MG/24HR 1 patch 2 (two) times a week.     fluticasone (FLONASE) 50 MCG/ACT nasal spray Place 2 sprays into both nostrils daily. 48 g 3   glipiZIDE (GLUCOTROL) 10 MG tablet Take 1 tablet (10 mg total) by mouth 2 (two) times daily before a meal. 180 tablet 3   glucose blood (ONETOUCH VERIO) test strip Use as instructed 100 each 1   Lancets (ONETOUCH DELICA PLUS LANCET33G) MISC USE AS DIRECTED ONCE DAILY TO  CHECK  GLUCOSE 100 each 0   Lancets (ONETOUCH DELICA PLUS LANCET33G) MISC USE 1  TO CHECK GLUCOSE ONCE DAILY 100 each 0   levocetirizine (XYZAL) 5 MG tablet TAKE 1 TABLET BY MOUTH ONCE DAILY IN THE EVENING 90 tablet 1   levothyroxine (SYNTHROID) 125 MCG tablet Take 1 tablet (125 mcg total) by mouth daily. 90 tablet 3   metFORMIN (GLUCOPHAGE) 1000 MG tablet Take 1 tablet (1,000 mg total) by mouth 2 (two) times daily with a meal. 180 tablet 3   metoprolol succinate (TOPROL-XL) 50 MG 24 hr tablet TAKE 1 TABLET BY MOUTH ONCE DAILY. TAKE WITH OR IMMEDIATELY FOLLOWING A MEAL 30 tablet 3   montelukast (SINGULAIR) 10 MG tablet TAKE 1 TABLET BY MOUTH AT BEDTIME 90 tablet 0   nortriptyline (PAMELOR) 50 MG capsule Take 1 capsule (50 mg total) by mouth at bedtime. 90 capsule 3   progesterone (PROMETRIUM) 100 MG  capsule Take 100 mg by mouth daily.     TART CHERRY PO Take 1 capsule by mouth.     Turmeric (QC TUMERIC COMPLEX) 500 MG CAPS Take 1 capsule by mouth daily at 6 (six) AM.     diclofenac Sodium (VOLTAREN) 1 % GEL Apply 4 g topically 4 (four) times daily. 50 g G   ketoconazole (NIZORAL) 2 % cream Apply 1 Application topically 2 (two) times daily. 30 g 5   Current Facility-Administered Medications  Medication Dose Route Frequency Provider Last Rate Last Admin   0.9 %  sodium chloride infusion  500 mL Intravenous Once Daymeon Fischman, Carie Caddy, MD        Allergies as of 01/14/2023 - Review Complete 01/14/2023  Allergen Reaction Noted   Molds & smuts Other (See Comments) 03/03/2022   Penicillins Hives 12/06/2008   Pollen extract Other (See Comments) 03/03/2022   Short ragweed pollen ext Other (See Comments) 03/03/2022    Family History  Problem Relation Age of Onset   Lung cancer Mother 56   Esophageal cancer Father 62       smoker   Colon polyps Brother 47   Arthritis Other    Coronary artery disease Other    Depression Other    Diabetes Other    Hyperlipidemia Other    Sudden death Other    Colon cancer Neg Hx    Rectal cancer Neg Hx    Stomach cancer Neg Hx     Social History   Socioeconomic History   Marital status: Divorced    Spouse name: Not on file   Number of children: 3   Years of education: 12+   Highest education level: Some college, no degree  Occupational History    Employer: [employer]  Tobacco Use   Smoking status: Never   Smokeless tobacco: Never  Vaping Use   Vaping status: Never Used  Substance and Sexual Activity   Alcohol use: No    Alcohol/week: 0.0 standard drinks of alcohol   Drug use: No   Sexual activity: Yes    Birth control/protection: None    Comment: ablation  Other Topics Concern   Not on file  Social History Narrative   Patient is married.     Patient has 3 children.    Patient lives with parents.    Patient is employed with  [employer] and [employer]    Patient has some college.    Social Determinants of Health   Financial Resource Strain: Low Risk  (03/02/2022)   Overall Financial Resource Strain (CARDIA)    Difficulty of Paying Living Expenses: Not very hard  Food Insecurity: Patient Declined (03/02/2022)   Hunger Vital Sign    Worried About Running Out of Food in the Last Year: Patient declined    Ran Out of Food in the Last Year: Patient declined  Transportation Needs: No Transportation Needs (03/02/2022)   PRAPARE - Administrator, Civil Service (Medical): No    Lack of Transportation (Non-Medical): No  Physical Activity: Unknown (03/02/2022)   Exercise Vital Sign    Days of Exercise per Week: Patient declined    Minutes of Exercise per Session: Not on file  Stress: No Stress Concern Present (03/02/2022)   [employer] of Occupational Health - Occupational Stress Questionnaire    Feeling of Stress : Not at all  Social Connections: Unknown (03/02/2022)   Social Connection and Isolation Panel [NHANES]    Frequency of Communication with Friends and Family: More than three times a week    Frequency of Social Gatherings with Friends and Family: More than three times a week    Attends Religious Services: More than 4 times per year    Active Member of [employer] or Organizations: Patient declined    Attends Banker Meetings: Not on file    Marital Status: Divorced  Intimate Partner Violence: Unknown (05/27/2021)   Received from [employer], [facility]   HITS    Physically  Hurt: Not on file    Insult or Talk Down To: Not on file    Threaten Physical Harm: Not on file    Scream or Curse: Not on file    Physical Exam: Vital signs in last 24 hours: @BP  (!) 142/83   Pulse 95   Temp 97.9 F (36.6 C) (Temporal)   Ht 4\' 11"  (1.499 m)   Wt 220 lb (99.8 kg)   LMP  (LMP Unknown)   SpO2 96%   BMI 44.43 kg/m  GEN: NAD EYE: Sclerae anicteric ENT: MMM CV: Non-tachycardic Pulm: CTA b/l GI: Soft,  NT/ND NEURO:  Alert & Oriented x 3   Erick Blinks, MD [facility]  01/14/2023 11:31 AM

## 2023-01-14 NOTE — Progress Notes (Signed)
Pt's states no medical or surgical changes since previsit or office visit. 

## 2023-01-15 ENCOUNTER — Telehealth: Payer: Self-pay | Admitting: *Deleted

## 2023-01-15 NOTE — Telephone Encounter (Signed)
  Follow up Call-     01/14/2023   11:09 AM  Call back number  Post procedure Call Back phone  # [phone]  Permission to leave phone message Yes     Patient questions:  Do you have a fever, pain , or abdominal swelling? No. Pain Score  0 *  Have you tolerated food without any problems? Yes.    Have you been able to return to your normal activities? Yes.    Do you have any questions about your discharge instructions: Diet   No. Medications  No. Follow up visit  No.  Do you have questions or concerns about your Care? No.  Actions: * If pain score is 4 or above: No action needed, pain <4.

## 2023-01-16 ENCOUNTER — Encounter: Payer: Self-pay | Admitting: Internal Medicine

## 2023-01-16 LAB — SURGICAL PATHOLOGY

## 2023-01-20 LAB — GENECONNECT MOLECULAR SCREEN: Genetic Analysis Overall Interpretation: NEGATIVE

## 2023-01-29 ENCOUNTER — Ambulatory Visit: Payer: BC Managed Care – PPO

## 2023-01-29 ENCOUNTER — Ambulatory Visit
Admission: RE | Admit: 2023-01-29 | Discharge: 2023-01-29 | Disposition: A | Discharge status: Home / Self Care | Payer: BC Managed Care – PPO | Source: Ambulatory Visit | Attending: Family Medicine | Admitting: Family Medicine

## 2023-01-29 VITALS — BP 132/83 | HR 98 | Temp 98.1°F | Resp 16

## 2023-01-29 DIAGNOSIS — M79675 Pain in left toe(s): Secondary | ICD-10-CM

## 2023-01-29 NOTE — ED Provider Notes (Signed)
[facility]    CSN: [id number] Arrival date & time: 01/29/23  1347      History   Chief Complaint Chief Complaint  Patient presents with   Foot Injury    Kicked my bed. Think my toe may be broken - Entered by patient    HPI Zoe [name] is a 52 y.o. female.   Presenting today with severe pain, bruising, swelling to the left pinky toe after kicking the bed frame 4 days ago.  States the pain is severe when she has to wear shoes and walk around, tolerable if at rest.  Denies numbness, tingling, skin injury, loss of range of motion of the foot.  Taking over-the-counter pain relievers with minimal relief.    Past Medical History:  Diagnosis Date   Allergy    Anemia    Benign hematuria    worked up by Dr. Ihor Gully   Chickenpox    Horseshoe kidney    Hx of migraine headaches    Hyperlipidemia    Hypertension    Menorrhagia    resolved with ablation   Osteoarthritis    patient denies this dx   SVD (spontaneous vaginal delivery)    x 1   Thyroid disease     Patient Active Problem List   Diagnosis Date Noted   Subluxation of extensor carpi ulnaris tendon, subsequent encounter 01/08/2022   Low back pain 02/26/2021   Somatic dysfunction of spine, lumbar 02/26/2021   Dyslipidemia 05/10/2019   Diabetes mellitus with coincident hypertension (HCC) 04/23/2015   Essential hypertension 04/23/2015   Hypothyroidism 04/19/2014   Hives 09/19/2013   Migraine headache 06/30/2013   Ganglion 03/18/2010   CHOLELITHIASIS 10/16/2009   HORSESHOE KIDNEY 10/16/2009   BACK PAIN, THORACIC REGION 07/04/2009   BENIGN POSITIONAL VERTIGO 03/21/2009   MENORRHAGIA 02/02/2008   ANEMIA-IRON DEFICIENCY 01/27/2008   ALLERGIC RHINITIS 01/27/2008   Osteoarthritis 01/27/2008    Past Surgical History:  Procedure Laterality Date   CESAREAN SECTION     x 2   CHOLECYSTECTOMY     COLONOSCOPY  11/16/2017   per Dr. Rhea Belton, sigmoid diverticulosis, no polyps, repeat in 5 yrs     SHOULDER SURGERY Right    arthroscopy   TUBAL LIGATION     TYMPANOSTOMY      OB History   No obstetric history on file.      Home Medications    Prior to Admission medications   Medication Sig Start Date End Date Taking? Authorizing Provider  albuterol (PROAIR HFA) 108 (90 Base) MCG/ACT inhaler Inhale 2 puffs into the lungs every 6 (six) hours as needed for wheezing or shortness of breath. 07/11/22   Nelwyn Salisbury, MD  atorvastatin (LIPITOR) 40 MG tablet Take 1 tablet (40 mg total) by mouth daily. 12/05/22   Nelwyn Salisbury, MD  Cholecalciferol (VITAMIN D3 PO) Take 1 tablet by mouth daily at 6 (six) AM.    [provider]  diclofenac Sodium (VOLTAREN) 1 % GEL Apply 4 g topically 4 (four) times daily. 12/25/21   Nelwyn Salisbury, MD  estradiol (VIVELLE-DOT) 0.0375 MG/24HR 1 patch 2 (two) times a week. 01/04/22   [provider]  fluticasone (FLONASE) 50 MCG/ACT nasal spray Place 2 sprays into both nostrils daily. 07/11/22   Nelwyn Salisbury, MD  glipiZIDE (GLUCOTROL) 10 MG tablet Take 1 tablet (10 mg total) by mouth 2 (two) times daily before a meal. 07/11/22   Nelwyn Salisbury, MD  glucose blood (ONETOUCH VERIO)  test strip Use as instructed 11/12/22   Nelwyn Salisbury, MD  ketoconazole (NIZORAL) 2 % cream Apply 1 Application topically 2 (two) times daily. 02/11/22   Nelwyn Salisbury, MD  Lancets [facility] DELICA PLUS [place]) MISC USE AS DIRECTED ONCE DAILY TO  CHECK  GLUCOSE 10/22/22   Nelwyn Salisbury, MD  Lancets [facility] DELICA PLUS [place]) MISC USE 1  TO CHECK GLUCOSE ONCE DAILY 10/22/22   Nelwyn Salisbury, MD  levocetirizine (XYZAL) 5 MG tablet TAKE 1 TABLET BY MOUTH ONCE DAILY IN THE EVENING 09/29/22   Nelwyn Salisbury, MD  levothyroxine (SYNTHROID) 125 MCG tablet Take 1 tablet (125 mcg total) by mouth daily. 08/06/22   Nelwyn Salisbury, MD  metFORMIN (GLUCOPHAGE) 1000 MG tablet Take 1 tablet (1,000 mg total) by mouth 2 (two) times daily with a meal. 12/05/22   Nelwyn Salisbury, MD   metoprolol succinate (TOPROL-XL) 50 MG 24 hr tablet TAKE 1 TABLET BY MOUTH ONCE DAILY. TAKE WITH OR IMMEDIATELY FOLLOWING A MEAL 11/17/22   Nelwyn Salisbury, MD  montelukast (SINGULAIR) 10 MG tablet TAKE 1 TABLET BY MOUTH AT BEDTIME 10/22/22   Nelwyn Salisbury, MD  nortriptyline (PAMELOR) 50 MG capsule Take 1 capsule (50 mg total) by mouth at bedtime. 07/11/22   Nelwyn Salisbury, MD  progesterone (PROMETRIUM) 100 MG capsule Take 100 mg by mouth daily. 02/18/22   [provider]  TART CHERRY PO Take 1 capsule by mouth.    [provider]  Turmeric (QC TUMERIC COMPLEX) 500 MG CAPS Take 1 capsule by mouth daily at 6 (six) AM.    [provider]  fexofenadine (ALLEGRA) 180 MG tablet Take 180 mg by mouth daily.    05/19/11  [provider]  Norgestimate-Ethinyl Estradiol Triphasic (TRI-SPRINTEC) 0.18/0.215/0.25 MG-35 MCG tablet Take 1 tablet by mouth daily. 04/01/11 05/19/11  Nelwyn Salisbury, MD    Family History Family History  Problem Relation Age of Onset   Lung cancer Mother 23   Esophageal cancer Father 12       smoker   Colon polyps Brother 35   Arthritis Other    Coronary artery disease Other    Depression Other    Diabetes Other    Hyperlipidemia Other    Sudden death Other    Colon cancer Neg Hx    Rectal cancer Neg Hx    Stomach cancer Neg Hx     Social History Social History   Tobacco Use   Smoking status: Never   Smokeless tobacco: Never  Vaping Use   Vaping status: Never Used  Substance Use Topics   Alcohol use: No    Alcohol/week: 0.0 standard drinks of alcohol   Drug use: No     Allergies   Molds & smuts, Penicillins, Pollen extract, and Short ragweed pollen ext   Review of Systems Review of Systems Per HPI  Physical Exam Triage Vital Signs ED Triage Vitals  Encounter Vitals Group     BP 01/29/23 1355 132/83     Systolic BP Percentile --      Diastolic BP Percentile --      Pulse Rate 01/29/23 1355 98     Resp 01/29/23 1355  16     Temp 01/29/23 1355 98.1 F (36.7 C)     Temp Source 01/29/23 1355 Oral     SpO2 01/29/23 1355 96 %     Weight --      Height --  Head Circumference --      Peak Flow --      Pain Score 01/29/23 1356 3     Pain Loc --      Pain Education --      Exclude from Growth Chart --    No data found.  Updated Vital Signs BP 132/83 (BP Location: Right Arm)   Pulse 98   Temp 98.1 F (36.7 C) (Oral)   Resp 16   SpO2 96%   Visual Acuity Right Eye Distance:   Left Eye Distance:   Bilateral Distance:    Right Eye Near:   Left Eye Near:    Bilateral Near:     Physical Exam Vitals and nursing note reviewed.  Constitutional:      Appearance: Normal appearance. She is not ill-appearing.  HENT:     Head: Atraumatic.  Eyes:     Extraocular Movements: Extraocular movements intact.     Conjunctiva/sclera: Conjunctivae normal.  Cardiovascular:     Rate and Rhythm: Normal rate and regular rhythm.     Heart sounds: Normal heart sounds.  Pulmonary:     Effort: Pulmonary effort is normal.     Breath sounds: Normal breath sounds.  Musculoskeletal:        General: Swelling, tenderness and signs of injury present.     Cervical back: Normal range of motion and neck supple.     Comments: Localized bruising, swelling, tenderness to palpation and decreased range of motion to the left fifth toe  Skin:    General: Skin is warm and dry.     Findings: Bruising present.  Neurological:     Mental Status: She is alert and oriented to person, place, and time.     Comments: Left lower extremity neurovascularly intact  Psychiatric:        Mood and Affect: Mood normal.        Thought Content: Thought content normal.        Judgment: Judgment normal.      UC Treatments / Results  Labs (all labs ordered are listed, but only abnormal results are displayed) Labs Reviewed - No data to display  EKG   Radiology DG Toe 5th Left  Result Date: 01/29/2023 CLINICAL DATA:  Pain after  trauma EXAM: DG TOE 5TH LEFT three views COMPARISON:  03/09/2020 x-ray series FINDINGS: Interval healing of the previous proximal phalanx fracture with chronic deformity. No additional acute fracture or dislocation identified. Preserved bone mineralization and adjacent joint spaces IMPRESSION: No acute osseous abnormality. Electronically Signed   By: Karen Kays M.D.   On: 01/29/2023 15:47    Procedures Procedures (including critical care time)  Medications Ordered in UC Medications - No data to display  Initial Impression / Assessment and Plan / UC Course  I have reviewed the triage vital signs and the nursing notes.  Pertinent labs & imaging results that were available during my care of the patient were reviewed by me and considered in my medical decision making (see chart for details).     X-ray of the left fifth toe negative for acute bony abnormality.  Placed in a postop shoe for comfort and to assist in ambulation, discussed RICE protocol, over-the-counter pain relievers.  Return for worsening symptoms.  Final Clinical Impressions(s) / UC Diagnoses   Final diagnoses:  Pain of toe of left foot     Discharge Instructions      I strongly suspect a fracture of the left pinky toe, we will have  that radiology report soon to confirm.  We have placed you in a postop shoe today and you may use ice, elevation, ibuprofen and Tylenol additionally.  Follow-up with [facility] if fractured.    ED Prescriptions   None    PDMP not reviewed this encounter.   Particia Nearing, [place] 01/29/23 [phone]

## 2023-01-29 NOTE — ED Triage Notes (Signed)
Pt reports she kicked her bed and now has left pinky toe pain x 3 days

## 2023-01-29 NOTE — Discharge Instructions (Signed)
I strongly suspect a fracture of the left pinky toe, we will have that radiology report soon to confirm.  We have placed you in a postop shoe today and you may use ice, elevation, ibuprofen and Tylenol additionally.  Follow-up with [facility] if fractured.

## 2023-02-27 NOTE — Progress Notes (Signed)
 [facility] [address] [place] [address] Phone: [phone] Subjective:   LILLETTE Berwyn Posey, am serving as a scribe for Dr. Arthea Claudene.  I'm seeing this patient by the request  of:  Johnny Garnette LABOR, MD  CC: Back and neck pain follow-up  YEP:Dlagzrupcz  Vastie D Battle is a 53 y.o. female coming in with complaint of back and neck pain. OMT on 01/07/2023. Patient states that her back was bothering her after she was in a boot due to toe injury. Back pain is improving though.            Reviewed prior external information including notes and imaging from previsou exam, outside providers and external EMR if available.   As well as notes that were available from care everywhere and other healthcare systems.  Past medical history, social, surgical and family history all reviewed in electronic medical record.  No pertanent information unless stated regarding to the chief complaint.   Past Medical History:  Diagnosis Date   Allergy    Anemia    Benign hematuria    worked up by Dr. Mark Ottelin   Chickenpox    Horseshoe kidney    Hx of migraine headaches    Hyperlipidemia    Hypertension    Menorrhagia    resolved with ablation   Osteoarthritis    patient denies this dx   SVD (spontaneous vaginal delivery)    x 1   Thyroid  disease     Allergies  Allergen Reactions   Molds & Smuts Other (See Comments)   Penicillins Hives   Pollen Extract Other (See Comments)   Short Ragweed Pollen Ext Other (See Comments)     Review of Systems:  No headache, visual changes, nausea, vomiting, diarrhea, constipation, dizziness, abdominal pain, skin rash, fevers, chills, night sweats, weight loss, swollen lymph nodes, body aches, joint swelling, chest pain, shortness of breath, mood changes. POSITIVE muscle aches  Objective  Blood pressure 104/72, pulse 64, height 4' 11 (1.499 m), weight 219 lb (99.3 kg), SpO2 97%.   General: No apparent  distress alert and oriented x3 mood and affect normal, dressed appropriately.  HEENT: Pupils equal, extraocular movements intact  Respiratory: Patient's speak in full sentences and does not appear short of breath  Cardiovascular: No lower extremity edema, non tender, no erythema  Gait MSK:  Back does have loss of lordosis tightness with FABER  Negative SLT   Osteopathic findings  C7 flexed rotated and side bent left T3 extended rotated and side bent right inhaled rib T9 extended rotated and side bent left L1 flexed rotated and side bent right L3 F RS left  Sacrum right on right       Assessment and Plan:  Low back pain Discussed HEP  Discussed which exercises and ergonomics  Continue to work on core  RTC in 8 weeks     Nonallopathic problems  Decision today to treat with OMT was based on Physical Exam  After verbal consent patient was treated with HVLA, ME, FPR techniques in cervical, rib, thoracic, lumbar, and sacral  areas  Patient tolerated the procedure well with improvement in symptoms  Patient given exercises, stretches and lifestyle modifications  See medications in patient instructions if given  Patient will follow up in 4-8 weeks     The above documentation has been reviewed and is accurate and complete Arthea CHRISTELLA Claudene, DO         Note: This dictation was  prepared with Dragon dictation along with smaller phrase technology. Any transcriptional errors that result from this process are unintentional.

## 2023-03-02 ENCOUNTER — Telehealth: Payer: Self-pay

## 2023-03-02 NOTE — Telephone Encounter (Signed)
 Copied from CRM [id number]. Topic: Clinical - Prescription Issue >> Mar 02, 2023  3:29 PM Zoe [name] wrote: Reason for CRM: Please send a 90 day supply of metoprolol  succinate (TOPROL -XL) 50 MG 24 hr tablet to [facility] [phone] - [place], [place] - [address] AT [facility] [address] [place] [address] Phone: [phone] Fax: [phone]

## 2023-03-03 ENCOUNTER — Other Ambulatory Visit: Payer: Self-pay

## 2023-03-03 MED ORDER — METOPROLOL SUCCINATE ER 50 MG PO TB24: 50.0000 mg | ORAL_TABLET | Freq: Every day | ORAL | 1 refills | Status: DC

## 2023-03-03 NOTE — Telephone Encounter (Signed)
 Rx sent

## 2023-03-05 ENCOUNTER — Ambulatory Visit: Payer: 59 | Admitting: Family Medicine

## 2023-03-05 ENCOUNTER — Encounter: Payer: Self-pay | Admitting: Family Medicine

## 2023-03-05 VITALS — BP 104/72 | HR 64 | Ht 59.0 in | Wt 219.0 lb

## 2023-03-05 DIAGNOSIS — M9901 Segmental and somatic dysfunction of cervical region: Secondary | ICD-10-CM | POA: Diagnosis not present

## 2023-03-05 DIAGNOSIS — M9903 Segmental and somatic dysfunction of lumbar region: Secondary | ICD-10-CM | POA: Diagnosis not present

## 2023-03-05 DIAGNOSIS — M9908 Segmental and somatic dysfunction of rib cage: Secondary | ICD-10-CM

## 2023-03-05 DIAGNOSIS — G8929 Other chronic pain: Secondary | ICD-10-CM

## 2023-03-05 DIAGNOSIS — M545 Low back pain, unspecified: Secondary | ICD-10-CM

## 2023-03-05 DIAGNOSIS — M9902 Segmental and somatic dysfunction of thoracic region: Secondary | ICD-10-CM | POA: Diagnosis not present

## 2023-03-05 DIAGNOSIS — M9904 Segmental and somatic dysfunction of sacral region: Secondary | ICD-10-CM

## 2023-03-05 NOTE — Assessment & Plan Note (Signed)
 Discussed HEP  Discussed which exercises and ergonomics  Continue to work on core  RTC in 8 weeks

## 2023-03-05 NOTE — Patient Instructions (Signed)
 Thanks for giving me a workout Stay out of the boot No more choice words See me again in 8 weeks

## 2023-04-28 NOTE — Progress Notes (Unsigned)
 [name] [address] [place] [address] Phone: [phone] Subjective:   Zoe [name], am serving as a scribe for Dr. Antoine Primas.  I'm seeing this patient by the request  of:  Nelwyn Salisbury, MD  CC: Back and neck pain follow-up  [id number]  Zoe [name] is a 53 y.o. female coming in with complaint of back and neck pain. OMT on 03/05/2023.Patient states that she has been doing well.  Patient does have some tightness in the neck  Medications patient has been prescribed:   Taking:         Reviewed prior external information including notes and imaging from previsou exam, outside providers and external EMR if available.   As well as notes that were available from care everywhere and other healthcare systems.  Past medical history, social, surgical and family history all reviewed in electronic medical record.  No pertanent information unless stated regarding to the chief complaint.   Past Medical History:  Diagnosis Date   Allergy    Anemia    Benign hematuria    worked up by Dr. Ihor Gully   Chickenpox    Horseshoe kidney    Hx of migraine headaches    Hyperlipidemia    Hypertension    Menorrhagia    resolved with ablation   Osteoarthritis    patient denies this dx   SVD (spontaneous vaginal delivery)    x 1   Thyroid disease     Allergies  Allergen Reactions   Molds & Smuts Other (See Comments)   Penicillins Hives   Pollen Extract Other (See Comments)   Short Ragweed Pollen Ext Other (See Comments)     Review of Systems:  No headache, visual changes, nausea, vomiting, diarrhea, constipation, dizziness, abdominal pain, skin rash, fevers, chills, night sweats, weight loss, swollen lymph nodes, body aches, joint swelling, chest pain, shortness of breath, mood changes. POSITIVE muscle aches  Objective  Blood pressure 110/78, pulse 86, height 4\' 11"  (1.499 m), weight 223 lb (101.2 kg), SpO2 96%.    General: No apparent distress alert and oriented x3 mood and affect normal, dressed appropriately.  HEENT: Pupils equal, extraocular movements intact  Respiratory: Patient's speak in full sentences and does not appear short of breath  Cardiovascular: No lower extremity edema, non tender, no erythema   MSK:  Back patient does have some tightness noted across the shoulder blades bilaterally.  Some tenderness to palpation noted. Tightness with FABER test minorly more on the right than the left.  Osteopathic findings  C2 flexed rotated and side bent right C7 flexed rotated and side bent left T3 extended rotated and side bent right inhaled rib T9 extended rotated and side bent left L1 flexed rotated and side bent right Sacrum right on right       Assessment and Plan:  Low back pain Continues to make improvement noted.  Do feel with patient not having to take care of her mother anymore at this point seems to be doing well.  Discussed icing regimen and home exercises, discussed which activities to do and which ones to avoid.  Continue to work on posture and Continuing to work on some weight loss.  Follow-up with me again in 10 to 12 weeks    Nonallopathic problems  Decision today to treat with OMT was based on Physical Exam  After verbal consent patient was treated with HVLA, ME, FPR techniques in cervical, rib, thoracic, lumbar, and sacral  areas  Patient tolerated the procedure well with improvement in symptoms  Patient given exercises, stretches and lifestyle modifications  See medications in patient instructions if given  Patient will follow up in 4-8 weeks    The above documentation has been reviewed and is accurate and complete Judi Saa, DO          Note: This dictation was prepared with Dragon dictation along with smaller phrase technology. Any transcriptional errors that result from this process are unintentional.

## 2023-04-30 ENCOUNTER — Encounter: Payer: Self-pay | Admitting: Family Medicine

## 2023-04-30 ENCOUNTER — Ambulatory Visit (INDEPENDENT_AMBULATORY_CARE_PROVIDER_SITE_OTHER): Payer: 59 | Admitting: Family Medicine

## 2023-04-30 VITALS — BP 110/78 | HR 86 | Ht 59.0 in | Wt 223.0 lb

## 2023-04-30 DIAGNOSIS — M9903 Segmental and somatic dysfunction of lumbar region: Secondary | ICD-10-CM | POA: Diagnosis not present

## 2023-04-30 DIAGNOSIS — M9901 Segmental and somatic dysfunction of cervical region: Secondary | ICD-10-CM

## 2023-04-30 DIAGNOSIS — M9908 Segmental and somatic dysfunction of rib cage: Secondary | ICD-10-CM | POA: Diagnosis not present

## 2023-04-30 DIAGNOSIS — M9902 Segmental and somatic dysfunction of thoracic region: Secondary | ICD-10-CM

## 2023-04-30 DIAGNOSIS — M545 Low back pain, unspecified: Secondary | ICD-10-CM

## 2023-04-30 DIAGNOSIS — M9904 Segmental and somatic dysfunction of sacral region: Secondary | ICD-10-CM | POA: Diagnosis not present

## 2023-04-30 DIAGNOSIS — G8929 Other chronic pain: Secondary | ICD-10-CM

## 2023-04-30 NOTE — Patient Instructions (Signed)
 Keep working with chef See me in 10-12 weeks

## 2023-04-30 NOTE — Assessment & Plan Note (Signed)
 Continues to make improvement noted.  Do feel with patient not having to take care of her mother anymore at this point seems to be doing well.  Discussed icing regimen and home exercises, discussed which activities to do and which ones to avoid.  Continue to work on posture and Continuing to work on some weight loss.  Follow-up with me again in 10 to 12 weeks

## 2023-05-06 ENCOUNTER — Ambulatory Visit (INDEPENDENT_AMBULATORY_CARE_PROVIDER_SITE_OTHER): Admitting: Family Medicine

## 2023-05-06 VITALS — BP 128/80 | HR 75 | Temp 97.2°F | Wt 221.0 lb

## 2023-05-06 DIAGNOSIS — J019 Acute sinusitis, unspecified: Secondary | ICD-10-CM

## 2023-05-06 MED ORDER — METHYLPREDNISOLONE ACETATE 80 MG/ML IJ SUSP
80.0000 mg | Freq: Once | INTRAMUSCULAR | Status: AC
  Administered 2023-05-06: 80 mg via INTRAMUSCULAR

## 2023-05-06 MED ORDER — METHYLPREDNISOLONE ACETATE 40 MG/ML IJ SUSP
40.0000 mg | Freq: Once | INTRAMUSCULAR | Status: AC
  Administered 2023-05-06: 40 mg via INTRAMUSCULAR

## 2023-05-06 MED ORDER — DOXYCYCLINE HYCLATE 100 MG PO TABS: 100.0000 mg | ORAL_TABLET | Freq: Two times a day (BID) | ORAL | 0 refills | Status: DC

## 2023-05-06 NOTE — Progress Notes (Signed)
   Subjective:    Patient ID: Zoe [name], female    DOB: [birth year], 53 y.o.   MRN: [id number]  HPI Here for 5 days of sinus pressure, facial pain, PND, and a dry cough. No fever. Taking Xyzal and Mucinex.    Review of Systems  Constitutional: Negative.   HENT:  Positive for congestion, postnasal drip and sinus pain. Negative for ear pain and sore throat.   Eyes: Negative.   Respiratory:  Positive for cough. Negative for shortness of breath and wheezing.        Objective:   Physical Exam Constitutional:      Appearance: Normal appearance.  HENT:     Right Ear: Tympanic membrane, ear canal and external ear normal.     Left Ear: Tympanic membrane, ear canal and external ear normal.     Nose: Nose normal.     Mouth/Throat:     Pharynx: Oropharynx is clear.  Eyes:     Conjunctiva/sclera: Conjunctivae normal.  Pulmonary:     Effort: Pulmonary effort is normal.     Breath sounds: Normal breath sounds.  Lymphadenopathy:     Cervical: No cervical adenopathy.  Neurological:     Mental Status: She is alert.           Assessment & Plan:  Sinusitis, treat with 10 days of Doxycycline and a shot of DepoMedrol.  Gershon Crane, MD

## 2023-05-06 NOTE — Addendum Note (Signed)
 Addended by: Carola Rhine on: 05/06/2023 11:11 AM   Modules accepted: Orders

## 2023-05-28 ENCOUNTER — Other Ambulatory Visit: Payer: Self-pay | Admitting: Family Medicine

## 2023-05-28 DIAGNOSIS — E785 Hyperlipidemia, unspecified: Secondary | ICD-10-CM

## 2023-06-02 ENCOUNTER — Other Ambulatory Visit: Payer: Self-pay | Admitting: Family Medicine

## 2023-06-02 MED ORDER — DOXYCYCLINE HYCLATE 100 MG PO TABS: 100.0000 mg | ORAL_TABLET | Freq: Two times a day (BID) | ORAL | 0 refills | Status: DC

## 2023-06-03 ENCOUNTER — Other Ambulatory Visit: Payer: Self-pay | Admitting: Family Medicine

## 2023-06-17 ENCOUNTER — Ambulatory Visit: Payer: Self-pay

## 2023-06-17 ENCOUNTER — Ambulatory Visit: Admitting: Family Medicine

## 2023-06-17 ENCOUNTER — Encounter: Payer: Self-pay | Admitting: Family Medicine

## 2023-06-17 VITALS — BP 122/74 | HR 81 | Temp 98.2°F | Wt 218.0 lb

## 2023-06-17 DIAGNOSIS — M654 Radial styloid tenosynovitis [de Quervain]: Secondary | ICD-10-CM

## 2023-06-17 MED ORDER — METHYLPREDNISOLONE 4 MG PO TBPK: ORAL_TABLET | ORAL | 0 refills | Status: DC

## 2023-06-17 NOTE — Progress Notes (Signed)
   Subjective:    Patient ID: Zoe [name], female    DOB: 03/02/1970, 53 y.o.   MRN: [id number]  HPI Here for one week of sharp pain in the right wrist and the right thumb. No recent trauma. No swelling or warnth ot redness. She has taken Tylenol  and Ibuprofen, and she has applied Diclofenac  gel, but these have not helped.    Review of Systems  Constitutional: Negative.   Respiratory: Negative.    Cardiovascular: Negative.   Musculoskeletal:  Positive for arthralgias.       Objective:   Physical Exam Constitutional:      Appearance: Normal appearance.  Cardiovascular:     Rate and Rhythm: Normal rate and regular rhythm.     Pulses: Normal pulses.     Heart sounds: Normal heart sounds.  Pulmonary:     Effort: Pulmonary effort is normal.     Breath sounds: Normal breath sounds.  Musculoskeletal:     Comments: Right hand appears normal with no swelling or warmth or erythema. The wrist is normal. She is tender along the dorsal thumb, and flexion of the thumb is painful   Neurological:     Mental Status: She is alert.           Assessment & Plan:  De Quervain's tenosynovitis. She will rest the hand as much as possible and apply ice QID. He will take a Medrol  dose pack. Recheck as needed.  Corita Diego, MD

## 2023-06-17 NOTE — Telephone Encounter (Signed)
 Chief Complaint: Hand pain Symptoms: Right hand pain and weakness, limited ROM Frequency: Constant onset 3-4 days ago Pertinent Negatives: Patient denies swelling,  Disposition: [] ED /[] Urgent Care (no appt availability in office) / [x] Appointment(In office/virtual)/ []  [facility]/ [] Home Care/ [] Refused Recommended Disposition /[] [facility]/ []  Follow-up with PCP Additional Notes: Patient reports a history of arthritis and carpal tunnel in the right hand. Patient reports the right hand started to hurt about 3-4 days ago and she has tried all OTC treatments that usually help with the discomfort but it is not working this tie. Care advice was given and patient has been scheduled for an appointment with PCP today at 1530.   Copied from CRM [id number]. Topic: Clinical - Red Word Triage >> Jun 17, 2023  1:00 PM Shardie S wrote: [facility] that prompted transfer to Nurse Triage: rt  hand pain 8/10 Reason for Disposition  [1] MODERATE pain (e.g., interferes with normal activities) AND [2] present > 3 days  Answer Assessment - Initial Assessment Questions 1. ONSET: "When did the pain start?"     3-4 days ago  2. LOCATION: "Where is the pain located?"     Right hand  3. PAIN: "How bad is the pain?" (Scale 1-10; or mild, moderate, severe)   - MILD (1-3): doesn't interfere with normal activities   - MODERATE (4-7): interferes with normal activities (e.g., work or school) or awakens from sleep   - SEVERE (8-10): excruciating pain, unable to use hand at all     5/10 to 8/10 4. WORK OR EXERCISE: "Has there been any recent work or exercise that involved this part (i.e., hand or wrist) of the body?"     No 5. CAUSE: "What do you think is causing the pain?"     I have carpal tunnel and arthritis in the hand  6. AGGRAVATING FACTORS: "What makes the pain worse?" (e.g., using computer)     Just moving it the wrong way 7. OTHER SYMPTOMS: "Do you have any other symptoms?" (e.g., neck  pain, swelling, rash, numbness, fever)     Limited ROM, weakness 8. PREGNANCY: "Is there any chance you are pregnant?" "When was your last menstrual period?"     N/A  Protocols used: Hand and Wrist Pain-A-AH

## 2023-06-18 NOTE — Telephone Encounter (Signed)
 Noted.

## 2023-07-08 NOTE — Progress Notes (Unsigned)
 Zoe [name] [address] [place] [address] Phone: [phone] Subjective:   Zoe [name], am serving as a scribe for Dr. Ronnell Coins.  I'm seeing this patient by the request  of:  Zoe Furth, MD  CC: back and neck pain follow up   [id number]  Zoe [name] is a 53 y.o. female coming in with complaint of back and neck pain. OMT on 04/30/2023. Patient states that she has been doing ok. Muscles in middle of her back grabbed her today but that has subsided.           Reviewed prior external information including notes and imaging from previsou exam, outside providers and external EMR if available.   As well as notes that were available from care everywhere and other healthcare systems.  Past medical history, social, surgical and family history all reviewed in electronic medical record.  No pertanent information unless stated regarding to the chief complaint.   Past Medical History:  Diagnosis Date   Allergy    Anemia    Benign hematuria    worked up by Dr. Mark Ottelin   Chickenpox    Horseshoe kidney    Hx of migraine headaches    Hyperlipidemia    Hypertension    Menorrhagia    resolved with ablation   Osteoarthritis    patient denies this dx   SVD (spontaneous vaginal delivery)    x 1   Thyroid  disease     Allergies  Allergen Reactions   Molds & Smuts Other (See Comments)   Penicillins Hives   Pollen Extract Other (See Comments)   Short Ragweed Pollen Ext Other (See Comments)     Review of Systems:  No headache, visual changes, nausea, vomiting, diarrhea, constipation, dizziness, abdominal pain, skin rash, fevers, chills, night sweats, weight loss, swollen lymph nodes, joint swelling, chest pain, shortness of breath, mood changes. POSITIVE muscle aches, body aches  Objective  Blood pressure 124/78, pulse 78, height 4\' 11"  (1.499 m), SpO2 98%.   General: No apparent distress alert and oriented x3 mood and  affect normal, dressed appropriately.  HEENT: Pupils equal, extraocular movements intact  Respiratory: Patient's speak in full sentences and does not appear short of breath  Cardiovascular: No lower extremity edema, non tender, no erythema  Gait relatively normal MSK:  Back low back a lot of tightness noted in the thoracolumbar juncture.  Neck seems to be doing relatively well at the moment.  Patient does have some limitation with rotation of the back.  Lacks the last 5 degrees of the extension.  Osteopathic findings   T9 extended rotated and side bent left T11 extended rotated sidebent left L2 flexed rotated and side bent right Sacrum right on right     Assessment and Plan:  Low back pain Continued low back pain.  Discussed icing regimen of home exercises, discussed which activities to do and which ones to avoid.  Patient does have some improvement in the gluteal strength.  Increase activity slowly.  Will follow-up again in 6 to 8 weeks otherwise.    Nonallopathic problems  Decision today to treat with OMT was based on Physical Exam  After verbal consent patient was treated with HVLA, ME, FPR techniques in  thoracic, lumbar, and sacral  areas  Patient tolerated the procedure well with improvement in symptoms  Patient given exercises, stretches and lifestyle modifications  See medications in patient instructions if given  Patient will follow up in  4-8 weeks    The above documentation has been reviewed and is accurate and complete Isidro Margo, DO          Note: This dictation was prepared with Dragon dictation along with smaller phrase technology. Any transcriptional errors that result from this process are unintentional.

## 2023-07-09 ENCOUNTER — Encounter: Payer: Self-pay | Admitting: Family Medicine

## 2023-07-09 ENCOUNTER — Ambulatory Visit: Admitting: Family Medicine

## 2023-07-09 VITALS — BP 124/78 | HR 78 | Ht 59.0 in

## 2023-07-09 DIAGNOSIS — G8929 Other chronic pain: Secondary | ICD-10-CM

## 2023-07-09 DIAGNOSIS — M9904 Segmental and somatic dysfunction of sacral region: Secondary | ICD-10-CM

## 2023-07-09 DIAGNOSIS — M9902 Segmental and somatic dysfunction of thoracic region: Secondary | ICD-10-CM

## 2023-07-09 DIAGNOSIS — M9903 Segmental and somatic dysfunction of lumbar region: Secondary | ICD-10-CM

## 2023-07-09 DIAGNOSIS — M545 Low back pain, unspecified: Secondary | ICD-10-CM | POA: Diagnosis not present

## 2023-07-09 NOTE — Patient Instructions (Signed)
 Good to see you Thank for shaving See me again in 2 months

## 2023-07-09 NOTE — Assessment & Plan Note (Signed)
 Continued low back pain.  Discussed icing regimen of home exercises, discussed which activities to do and which ones to avoid.  Patient does have some improvement in the gluteal strength.  Increase activity slowly.  Will follow-up again in 6 to 8 weeks otherwise.

## 2023-08-03 ENCOUNTER — Other Ambulatory Visit: Payer: Self-pay | Admitting: Family Medicine

## 2023-08-06 ENCOUNTER — Encounter: Payer: Self-pay | Admitting: Family Medicine

## 2023-08-06 ENCOUNTER — Ambulatory Visit

## 2023-08-06 ENCOUNTER — Ambulatory Visit: Admitting: Family Medicine

## 2023-08-06 VITALS — BP 106/72 | Ht 59.0 in | Wt 218.0 lb

## 2023-08-06 DIAGNOSIS — M9903 Segmental and somatic dysfunction of lumbar region: Secondary | ICD-10-CM

## 2023-08-06 DIAGNOSIS — M545 Low back pain, unspecified: Secondary | ICD-10-CM | POA: Diagnosis not present

## 2023-08-06 DIAGNOSIS — M9908 Segmental and somatic dysfunction of rib cage: Secondary | ICD-10-CM

## 2023-08-06 DIAGNOSIS — M9901 Segmental and somatic dysfunction of cervical region: Secondary | ICD-10-CM

## 2023-08-06 DIAGNOSIS — M9902 Segmental and somatic dysfunction of thoracic region: Secondary | ICD-10-CM | POA: Diagnosis not present

## 2023-08-06 DIAGNOSIS — G5701 Lesion of sciatic nerve, right lower limb: Secondary | ICD-10-CM

## 2023-08-06 DIAGNOSIS — G8929 Other chronic pain: Secondary | ICD-10-CM

## 2023-08-06 DIAGNOSIS — M9904 Segmental and somatic dysfunction of sacral region: Secondary | ICD-10-CM

## 2023-08-06 MED ORDER — METHYLPREDNISOLONE ACETATE 40 MG/ML IJ SUSP
80.0000 mg | Freq: Once | INTRAMUSCULAR | Status: AC
  Administered 2023-08-06: 80 mg via INTRAMUSCULAR

## 2023-08-06 MED ORDER — KETOROLAC TROMETHAMINE 60 MG/2ML IM SOLN
60.0000 mg | Freq: Once | INTRAMUSCULAR | Status: AC
  Administered 2023-08-06: 60 mg via INTRAMUSCULAR

## 2023-08-06 NOTE — Assessment & Plan Note (Signed)
 Patient given injection today and tolerated the procedure well, discussed icing regimen and home exercises, increase activity slowly.  Discussed icing regimen and home exercises.  Increase activity slowly.

## 2023-08-06 NOTE — Assessment & Plan Note (Signed)
 Low back does have some loss of lordosis noted.  Significant stiffness noted on the musculature today to be out of proportion.  Toradol  dry in ED.  Significant tightness noted is lateral in the lumbar spine.  Will get x-rays to further evaluate any type of arthritic changes that could be contributing.  Worsening pain advanced imaging is warranted.

## 2023-08-06 NOTE — Progress Notes (Signed)
 [facility] [address] [place] [address] Phone: [phone] Subjective:   Zoe [name], am serving as a scribe for Dr. Ronnell Coins.  I'm seeing this patient by the request  of:  Donley Furth, MD  CC: Right sided low back pain  [id number]  [name] is a 53 y.o. female coming in with complaint of back and neck pain. OMT 07/09/2023. Patient states that for past 3 weeks she has been having R sided SI joint pain. Painful when she is awake at rest and with activity intermittently. Pain is sharp at times.   Medications patient has been prescribed:   Taking:         Reviewed prior external information including notes and imaging from previsou exam, outside providers and external EMR if available.   As well as notes that were available from care everywhere and other healthcare systems.  Past medical history, social, surgical and family history all reviewed in electronic medical record.  No pertanent information unless stated regarding to the chief complaint.   Past Medical History:  Diagnosis Date   Allergy    Anemia    Benign hematuria    worked up by Dr. Mark Ottelin   Chickenpox    Horseshoe kidney    Hx of migraine headaches    Hyperlipidemia    Hypertension    Menorrhagia    resolved with ablation   Osteoarthritis    patient denies this dx   SVD (spontaneous vaginal delivery)    x 1   Thyroid  disease     Allergies  Allergen Reactions   Molds & Smuts Other (See Comments)   Penicillins Hives   Pollen Extract Other (See Comments)   Short Ragweed Pollen Ext Other (See Comments)     Review of Systems:  No headache, visual changes, nausea, vomiting, diarrhea, constipation, dizziness, abdominal pain, skin rash, fevers, chills, night sweats, weight loss, swollen lymph nodes, body aches, joint swelling, chest pain, shortness of breath, mood changes. POSITIVE muscle aches  Objective  Blood pressure 106/72,  height 4' 11 (1.499 m), weight 218 lb (98.9 kg).   General: No apparent distress alert and oriented x3 mood and affect normal, dressed appropriately.  HEENT: Pupils equal, extraocular movements intact  Respiratory: Patient's speak in full sentences and does not appear short of breath  Cardiovascular: No lower extremity edema, non tender, no erythema  Gait antalgic favoring the right side of the back. MSK:  Back does have significant loss of lordosis.  Tightness around the paraspinal musculature at the L4 area.  Seems to be very tight.  Osteopathic findings  C2 flexed rotated and side bent right C6 flexed rotated and side bent left T3 extended rotated and side bent right inhaled rib T9 extended rotated and side bent left L2 flexed rotated and side bent right L4 flexed rotated and side bent right Sacrum right on right   After verbal consent patient was prepped with alcohol swab and with a 21-gauge 2 inch needle injected into the right piriformis muscle with a total of 2 cc of 0.5% Marcaine and 1 cc of Kenalog 40 mg/mL.  No blood loss.  Band-Aid placed.  Postinjection instruction given    Assessment and Plan:  Low back pain Low back does have some loss of lordosis noted.  Significant stiffness noted on the musculature today to be out of proportion.  Toradol  dry in ED.  Significant tightness noted is lateral in the lumbar spine.  Will get x-rays to further evaluate any type of arthritic changes that could be contributing.  Worsening pain advanced imaging is warranted.    Nonallopathic problems  Decision today to treat with OMT was based on Physical Exam  After verbal consent patient was treated with HVLA, ME, FPR techniques in cervical, rib, thoracic, lumbar, and sacral  areasAvoiding HVLA on the neck  Patient tolerated the procedure well with improvement in symptoms  Patient given exercises, stretches and lifestyle modifications  See medications in patient instructions if  given  Patient will follow up in 4-8 weeks    The above documentation has been reviewed and is accurate and complete Menaal Russum M Saketh Daubert, DO          Note: This dictation was prepared with Dragon dictation along with smaller phrase technology. Any transcriptional errors that result from this process are unintentional.

## 2023-08-06 NOTE — Patient Instructions (Addendum)
 Injected piriformis Xray today Update me tmrw See me again in 5-6 weeks

## 2023-08-07 ENCOUNTER — Encounter: Payer: Self-pay | Admitting: Family Medicine

## 2023-08-07 ENCOUNTER — Ambulatory Visit (INDEPENDENT_AMBULATORY_CARE_PROVIDER_SITE_OTHER): Admitting: Family Medicine

## 2023-08-07 VITALS — BP 130/80 | HR 89 | Temp 98.1°F | Ht 59.75 in | Wt 220.0 lb

## 2023-08-07 DIAGNOSIS — E785 Hyperlipidemia, unspecified: Secondary | ICD-10-CM

## 2023-08-07 DIAGNOSIS — Z0000 Encounter for general adult medical examination without abnormal findings: Secondary | ICD-10-CM | POA: Diagnosis not present

## 2023-08-07 MED ORDER — LANCET DEVICE MISC: 1.0000 | Freq: Three times a day (TID) | 10 refills | Status: AC

## 2023-08-07 MED ORDER — METOPROLOL SUCCINATE ER 50 MG PO TB24: 50.0000 mg | ORAL_TABLET | Freq: Every day | ORAL | 3 refills | Status: DC

## 2023-08-07 MED ORDER — BLOOD GLUCOSE TEST VI STRP: 1.0000 | ORAL_STRIP | Freq: Three times a day (TID) | 10 refills | Status: AC

## 2023-08-07 MED ORDER — LEVOTHYROXINE SODIUM 125 MCG PO TABS: 125.0000 ug | ORAL_TABLET | Freq: Every day | ORAL | 3 refills | Status: DC

## 2023-08-07 MED ORDER — BLOOD GLUCOSE MONITORING SUPPL DEVI: 1.0000 | Freq: Three times a day (TID) | 0 refills | Status: AC

## 2023-08-07 MED ORDER — LANCETS MISC. MISC: 1.0000 | Freq: Three times a day (TID) | 10 refills | Status: AC

## 2023-08-07 MED ORDER — ATORVASTATIN CALCIUM 40 MG PO TABS: 40.0000 mg | ORAL_TABLET | Freq: Every day | ORAL | 3 refills | Status: AC

## 2023-08-07 NOTE — Progress Notes (Signed)
   Subjective:    Patient ID: Zoe [name], female    DOB: March 25, 1970, 53 y.o.   MRN: [id number]  HPI Here for a well exam. She feels well. She has a diabetic eye exam yearly.    Review of Systems  Constitutional: Negative.   HENT: Negative.    Eyes: Negative.   Respiratory: Negative.    Cardiovascular: Negative.   Gastrointestinal: Negative.   Genitourinary:  Negative for decreased urine volume, difficulty urinating, dyspareunia, dysuria, enuresis, flank pain, frequency, hematuria, pelvic pain and urgency.  Musculoskeletal: Negative.   Skin: Negative.   Neurological: Negative.  Negative for headaches.  Psychiatric/Behavioral: Negative.         Objective:   Physical Exam Constitutional:      General: She is not in acute distress.    Appearance: She is well-developed. She is obese.  HENT:     Head: Normocephalic and atraumatic.     Right Ear: External ear normal.     Left Ear: External ear normal.     Nose: Nose normal.     Mouth/Throat:     Pharynx: No oropharyngeal exudate.   Eyes:     General: No scleral icterus.    Conjunctiva/sclera: Conjunctivae normal.     Pupils: Pupils are equal, round, and reactive to light.   Neck:     Thyroid : No thyromegaly.     Vascular: No JVD.   Cardiovascular:     Rate and Rhythm: Normal rate and regular rhythm.     Pulses: Normal pulses.     Heart sounds: Normal heart sounds. No murmur heard.    No friction rub. No gallop.  Pulmonary:     Effort: Pulmonary effort is normal. No respiratory distress.     Breath sounds: Normal breath sounds. No wheezing or rales.  Chest:     Chest wall: No tenderness.  Abdominal:     General: Bowel sounds are normal. There is no distension.     Palpations: Abdomen is soft. There is no mass.     Tenderness: There is no abdominal tenderness. There is no guarding or rebound.   Musculoskeletal:        General: No tenderness. Normal range of motion.     Cervical back: Normal range of motion  and neck supple.  Lymphadenopathy:     Cervical: No cervical adenopathy.   Skin:    General: Skin is warm and dry.     Findings: No erythema or rash.   Neurological:     General: No focal deficit present.     Mental Status: She is alert and oriented to person, place, and time.     Cranial Nerves: No cranial nerve deficit.     Motor: No abnormal muscle tone.     Coordination: Coordination normal.     Deep Tendon Reflexes: Reflexes are normal and symmetric. Reflexes normal.   Psychiatric:        Mood and Affect: Mood normal.        Behavior: Behavior normal.        Thought Content: Thought content normal.        Judgment: Judgment normal.           Assessment & Plan:  Well exam. We discussed diet and exercise. Get fasting labs. Corita Diego, MD

## 2023-08-08 ENCOUNTER — Other Ambulatory Visit: Payer: Self-pay | Admitting: Family Medicine

## 2023-08-08 LAB — LIPID PANEL
Cholesterol: 181 mg/dL (ref <200)
HDL: 55 mg/dL (ref >=50)
LDL Cholesterol (Calc): 101 mg/dL — ABNORMAL HIGH
Non-HDL Cholesterol (Calc): 126 mg/dL (ref <130)
Total CHOL/HDL Ratio: 3.3 (calc) (ref <5.0)
Triglycerides: 151 mg/dL — ABNORMAL HIGH (ref <150)

## 2023-08-08 LAB — BASIC METABOLIC PANEL WITH GFR
BUN: 14 mg/dL (ref 7–25)
CO2: 25 mmol/L (ref 20–32)
Calcium: 9.6 mg/dL (ref 8.6–10.4)
Chloride: 105 mmol/L (ref 98–110)
Creat: 0.62 mg/dL (ref 0.50–1.03)
Glucose, Bld: 269 mg/dL — ABNORMAL HIGH (ref 65–99)
Potassium: 4.3 mmol/L (ref 3.5–5.3)
Sodium: 138 mmol/L (ref 135–146)
eGFR: 106 mL/min/{1.73_m2} (ref >=60)

## 2023-08-08 LAB — CBC WITH DIFFERENTIAL/PLATELET
Absolute Lymphocytes: 2584 {cells}/uL (ref 850–3900)
Absolute Monocytes: 988 {cells}/uL — ABNORMAL HIGH (ref 200–950)
Basophils Absolute: 57 {cells}/uL (ref 0–200)
Basophils Relative: 0.6 %
Eosinophils Absolute: 200 {cells}/uL (ref 15–500)
Eosinophils Relative: 2.1 %
HCT: 44.3 % (ref 35.0–45.0)
Hemoglobin: 14.7 g/dL (ref 11.7–15.5)
MCH: 31.3 pg (ref 27.0–33.0)
MCHC: 33.2 g/dL (ref 32.0–36.0)
MCV: 94.3 fL (ref 80.0–100.0)
MPV: 9.1 fL (ref 7.5–12.5)
Monocytes Relative: 10.4 %
Neutro Abs: 5672 {cells}/uL (ref 1500–7800)
Neutrophils Relative %: 59.7 %
Platelets: 250 10*3/uL (ref 140–400)
RBC: 4.7 10*6/uL (ref 3.80–5.10)
RDW: 13.9 % (ref 11.0–15.0)
Total Lymphocyte: 27.2 %
WBC: 9.5 10*3/uL (ref 3.8–10.8)

## 2023-08-08 LAB — HEPATIC FUNCTION PANEL
AG Ratio: 1.5 (calc) (ref 1.0–2.5)
ALT: 118 U/L — ABNORMAL HIGH (ref 6–29)
AST: 82 U/L — ABNORMAL HIGH (ref 10–35)
Albumin: 4.3 g/dL (ref 3.6–5.1)
Alkaline phosphatase (APISO): 96 U/L (ref 37–153)
Bilirubin, Direct: 0.1 mg/dL (ref 0.0–0.2)
Globulin: 2.8 g/dL (ref 1.9–3.7)
Indirect Bilirubin: 0.4 mg/dL (ref 0.2–1.2)
Total Bilirubin: 0.5 mg/dL (ref 0.2–1.2)
Total Protein: 7.1 g/dL (ref 6.1–8.1)

## 2023-08-08 LAB — HEMOGLOBIN A1C
Hgb A1c MFr Bld: 9.6 % — ABNORMAL HIGH (ref <5.7)
Mean Plasma Glucose: 229 mg/dL
eAG (mmol/L): 12.7 mmol/L

## 2023-08-08 LAB — TSH: TSH: 5.04 m[IU]/L — ABNORMAL HIGH

## 2023-08-10 ENCOUNTER — Ambulatory Visit: Payer: Self-pay | Admitting: Family Medicine

## 2023-08-10 DIAGNOSIS — E038 Other specified hypothyroidism: Secondary | ICD-10-CM

## 2023-08-10 MED ORDER — LEVOTHYROXINE SODIUM 150 MCG PO TABS: 150.0000 ug | ORAL_TABLET | Freq: Every day | ORAL | 3 refills | Status: AC

## 2023-08-11 ENCOUNTER — Ambulatory Visit: Payer: Self-pay | Admitting: Family Medicine

## 2023-08-11 MED ORDER — OZEMPIC (0.25 OR 0.5 MG/DOSE) 2 MG/3ML ~~LOC~~ SOPN: 0.2500 mg | PEN_INJECTOR | SUBCUTANEOUS | 2 refills | Status: DC

## 2023-08-11 NOTE — Telephone Encounter (Signed)
 I sent in for her to try Ozempic

## 2023-08-12 ENCOUNTER — Telehealth: Payer: Self-pay

## 2023-08-12 ENCOUNTER — Other Ambulatory Visit (HOSPITAL_COMMUNITY): Payer: Self-pay

## 2023-08-12 NOTE — Telephone Encounter (Signed)
 Pharmacy Patient Advocate Encounter   Received notification from CoverMyMeds that prior authorization for Ozempic 2 is required/requested.   Insurance verification completed.   The patient is insured through [facility] .   Per test claim: PA required; PA submitted to above mentioned insurance via CoverMyMeds Key/confirmation #/EOC [id number] Status is pending

## 2023-08-12 NOTE — Telephone Encounter (Signed)
 Pharmacy Patient Advocate Encounter  Received notification from [facility] that Prior Authorization for Ozempic (0.25 or 0.5 MG/DOSE) 2MG /3ML pen-injectors  has been APPROVED from 08/12/23 to 08/12/26. Unable to obtain price due to refill too soon rejection, last fill date 08/12/23 next available fill date07/13/25   PA #/Case ID/Reference #: [id number]

## 2023-08-28 ENCOUNTER — Other Ambulatory Visit: Payer: Self-pay | Admitting: Family Medicine

## 2023-08-30 ENCOUNTER — Other Ambulatory Visit: Payer: Self-pay | Admitting: Family Medicine

## 2023-09-08 ENCOUNTER — Ambulatory Visit: Admitting: Family Medicine

## 2023-09-08 ENCOUNTER — Encounter: Payer: Self-pay | Admitting: Family Medicine

## 2023-09-08 VITALS — BP 128/70 | HR 120 | Ht 59.0 in | Wt 214.0 lb

## 2023-09-08 DIAGNOSIS — M9901 Segmental and somatic dysfunction of cervical region: Secondary | ICD-10-CM

## 2023-09-08 DIAGNOSIS — M9902 Segmental and somatic dysfunction of thoracic region: Secondary | ICD-10-CM

## 2023-09-08 DIAGNOSIS — M9908 Segmental and somatic dysfunction of rib cage: Secondary | ICD-10-CM | POA: Diagnosis not present

## 2023-09-08 DIAGNOSIS — G8929 Other chronic pain: Secondary | ICD-10-CM

## 2023-09-08 DIAGNOSIS — M9903 Segmental and somatic dysfunction of lumbar region: Secondary | ICD-10-CM | POA: Diagnosis not present

## 2023-09-08 DIAGNOSIS — M9904 Segmental and somatic dysfunction of sacral region: Secondary | ICD-10-CM

## 2023-09-08 DIAGNOSIS — M545 Low back pain, unspecified: Secondary | ICD-10-CM | POA: Diagnosis not present

## 2023-09-08 NOTE — Progress Notes (Signed)
 [name] [address] [place] [address] Phone: [phone] Subjective:   ISusannah [name], am serving as a scribe for Dr. Arthea Claudene.  I'm seeing this patient by the request  of:  Johnny Garnette LABOR, MD  CC: Low back pain follow-up  YEP:Dlagzrupcz  Zoe [name] is a 53 y.o. female coming in with complaint of back and neck pain. OMT on 08/06/2023. Patient states same per usual. No new symptoms.  Medications patient has been prescribed:   Taking:         Reviewed prior external information including notes and imaging from previsou exam, outside providers and external EMR if available.   As well as notes that were available from care everywhere and other healthcare systems.  Past medical history, social, surgical and family history all reviewed in electronic medical record.  No pertanent information unless stated regarding to the chief complaint.   Past Medical History:  Diagnosis Date   Allergy    Anemia    Benign hematuria    worked up by Dr. Mark Ottelin   Chickenpox    Horseshoe kidney    Hx of migraine headaches    Hyperlipidemia    Hypertension    Menorrhagia    resolved with ablation   Osteoarthritis    patient denies this dx   SVD (spontaneous vaginal delivery)    x 1   Thyroid  disease     Allergies  Allergen Reactions   Molds & Smuts Other (See Comments)   Penicillins Hives   Pollen Extract Other (See Comments)   Short Ragweed Pollen Ext Other (See Comments)     Review of Systems:  No headache, visual changes, nausea, vomiting, diarrhea, constipation, dizziness, abdominal pain, skin rash, fevers, chills, night sweats, weight loss, swollen lymph nodes, body aches, joint swelling, chest pain, shortness of breath, mood changes. POSITIVE muscle aches  Objective  Blood pressure 128/70, pulse (!) 120, height 4' 11 (1.499 m), weight 214 lb (97.1 kg), SpO2 96%.   General: No apparent distress alert and oriented  x3 mood and affect normal, dressed appropriately.  HEENT: Pupils equal, extraocular movements intact  Respiratory: Patient's speak in full sentences and does not appear short of breath  Cardiovascular: No lower extremity edema, non tender, no erythema  Gait relatively normal MSK:  Back does have some loss of lordosis noted.  There are some arthritic changes in been noted.  Patient though is very relaxed.  Osteopathic findings  C2 flexed rotated and side bent right C6 flexed rotated and side bent left T3 extended rotated and side bent right inhaled rib T9 extended rotated and side bent left L2 flexed rotated and side bent right L3 flexed rotated and side bent left Sacrum right on right     Assessment and Plan:  No problem-specific Assessment & Plan notes found for this encounter.    Nonallopathic problems  Decision today to treat with OMT was based on Physical Exam  After verbal consent patient was treated with, ME, FPR techniques in cervical, rib, thoracic, lumbar, and sacral  areas  Patient tolerated the procedure well with improvement in symptoms  Patient given exercises, stretches and lifestyle modifications  See medications in patient instructions if given  Patient will follow up in 4-8 weeks      The above documentation has been reviewed and is accurate and complete Maryruth Apple M Shanitha Twining, DO        Note: This dictation was prepared with Dragon dictation  along with smaller phrase technology. Any transcriptional errors that result from this process are unintentional.

## 2023-09-08 NOTE — Patient Instructions (Signed)
 Good to see you! Get yourself something nice for your birthday See you again in 2 months

## 2023-09-08 NOTE — Assessment & Plan Note (Signed)
 Overall patient has been doing relatively well.  Patient has been not extremely active and enjoying her summer break.  Patient will start increasing some activity.  Will be standing a lot more for work dreads coming up in the next month.  Increase activity slowly otherwise.  Follow-up again in 6 to 8 weeks.

## 2023-10-20 ENCOUNTER — Other Ambulatory Visit: Payer: Self-pay | Admitting: Family Medicine

## 2023-10-20 DIAGNOSIS — Z1231 Encounter for screening mammogram for malignant neoplasm of breast: Secondary | ICD-10-CM

## 2023-11-04 ENCOUNTER — Ambulatory Visit
Admission: RE | Admit: 2023-11-04 | Discharge: 2023-11-04 | Disposition: A | Discharge status: Home / Self Care | Source: Ambulatory Visit | Attending: Family Medicine | Admitting: Family Medicine

## 2023-11-04 DIAGNOSIS — Z1231 Encounter for screening mammogram for malignant neoplasm of breast: Secondary | ICD-10-CM

## 2023-11-10 ENCOUNTER — Other Ambulatory Visit: Payer: Self-pay

## 2023-11-10 ENCOUNTER — Ambulatory Visit: Admitting: Family Medicine

## 2023-11-10 ENCOUNTER — Encounter: Payer: Self-pay | Admitting: Family Medicine

## 2023-11-10 VITALS — BP 108/78 | HR 89 | Temp 98.1°F | Wt 216.0 lb

## 2023-11-10 DIAGNOSIS — E119 Type 2 diabetes mellitus without complications: Secondary | ICD-10-CM | POA: Diagnosis not present

## 2023-11-10 DIAGNOSIS — I10 Essential (primary) hypertension: Secondary | ICD-10-CM

## 2023-11-10 LAB — POCT GLYCOSYLATED HEMOGLOBIN (HGB A1C): Hemoglobin A1C: 6.1 % — AB (ref 4.0–5.6)

## 2023-11-10 MED ORDER — OZEMPIC (0.25 OR 0.5 MG/DOSE) 2 MG/3ML ~~LOC~~ SOPN: 0.5000 mg | PEN_INJECTOR | SUBCUTANEOUS | 2 refills | Status: AC

## 2023-11-10 MED ORDER — METFORMIN HCL 500 MG PO TABS: 500.0000 mg | ORAL_TABLET | Freq: Two times a day (BID) | ORAL | 3 refills | Status: AC

## 2023-11-10 MED ORDER — MONTELUKAST SODIUM 10 MG PO TABS: 10.0000 mg | ORAL_TABLET | Freq: Every day | ORAL | 0 refills | Status: DC

## 2023-11-10 NOTE — Progress Notes (Signed)
   Subjective:    Patient ID: Zoe [name], female    DOB: [birth year], 53 y.o.   MRN: [id number]  HPI Here to follow up on diabetes and obesity. She has been taking Ozempic  0.25 mg weekly, and this has gone well other than some occasional nausea. She has lsot 4 lbs. Her A1c today is 6.2% (down from 9.6 three months ago). Her BP is stable.    Review of Systems  Constitutional: Negative.   Respiratory: Negative.    Cardiovascular: Negative.   Gastrointestinal:  Positive for nausea. Negative for abdominal distention, abdominal pain, blood in stool, constipation, diarrhea and vomiting.       Objective:   Physical Exam Constitutional:      Appearance: Normal appearance.  Cardiovascular:     Rate and Rhythm: Normal rate and regular rhythm.     Pulses: Normal pulses.     Heart sounds: Normal heart sounds.  Pulmonary:     Effort: Pulmonary effort is normal.     Breath sounds: Normal breath sounds.  Neurological:     Mental Status: She is alert.           Assessment & Plan:  Her diabetes is now under very good control and she is losing weight slowly. We will increase the Ozempic  to 0.5 mg weekly. We will decrease the Metformin  to 500 mg BID. Her HTN is stable. She will return in 3 months.  Garnette Olmsted, MD

## 2023-11-10 NOTE — Progress Notes (Unsigned)
 Darlyn Claudene [facility] [address] [place] [address] Phone: [phone] Subjective:   LILLETTE Berwyn Posey, am serving as a scribe for Dr. Arthea Claudene.  I'm seeing this patient by the request  of:  Johnny Garnette LABOR, MD  CC: Back and neck pain follow-up  YEP:Dlagzrupcz  Cerissa D Moren is a 53 y.o. female coming in with complaint of back and neck pain. OMT on 09/08/2023. Patient states that her L wrist is worsening. Swelling and pain over ulnar styloid process. Pain is constant.            Reviewed prior external information including notes and imaging from previsou exam, outside providers and external EMR if available.   As well as notes that were available from care everywhere and other healthcare systems.  Past medical history, social, surgical and family history all reviewed in electronic medical record.  No pertanent information unless stated regarding to the chief complaint.   Past Medical History:  Diagnosis Date   Allergy    Anemia    Benign hematuria    worked up by Dr. Mark Ottelin   Chickenpox    Horseshoe kidney    Hx of migraine headaches    Hyperlipidemia    Hypertension    Menorrhagia    resolved with ablation   Osteoarthritis    patient denies this dx   SVD (spontaneous vaginal delivery)    x 1   Thyroid  disease     Allergies  Allergen Reactions   Molds & Smuts Other (See Comments)   Penicillins Hives   Pollen Extract Other (See Comments)   Short Ragweed Pollen Ext Other (See Comments)     Review of Systems:  No headache, visual changes, nausea, vomiting, diarrhea, constipation, dizziness, abdominal pain, skin rash, fevers, chills, night sweats, weight loss, swollen lymph nodes, body aches, joint swelling, chest pain, shortness of breath, mood changes. POSITIVE muscle aches  Objective  Blood pressure 112/70, pulse 81, height 4' 11 (1.499 m), weight 215 lb (97.5 kg), SpO2 99%.   General: No apparent distress alert  and oriented x3 mood and affect normal, dressed appropriately.  HEENT: Pupils equal, extraocular movements intact  Respiratory: Patient's speak in full sentences and does not appear short of breath  Cardiovascular: No lower extremity edema, non tender, no erythema  Gait Significant tightness in the neck seems to be more on the left side.  Some tenderness to palpation diffusely.  Osteopathic findings  C2 flexed rotated and side bent left C6 flexed rotated and side bent left T3 extended rotated and side bent right inhaled rib T9 extended rotated and side bent left L2 flexed rotated and side bent right Sacrum right on right   Procedure: Real-time Ultrasound Guided Injection of left extensor carpi ulnaris tendon sheath Device: GE Logiq Q7 Ultrasound guided injection is preferred based studies that show increased duration, increased effect, greater accuracy, decreased procedural pain, increased response rate, and decreased cost with ultrasound guided versus blind injection.  Verbal informed consent obtained.  Time-out conducted.  Noted no overlying erythema, induration, or other signs of local infection.  Skin prepped in a sterile fashion.  Local anesthesia: Topical Ethyl chloride.  With sterile technique and under real time ultrasound guidance: With a 25-gauge half inch needle injected with 0.5 cc 0.5% Marcaine and 0.5 cc of Kenalog 40 mg/mL Completed without difficulty  Pain immediately resolved suggesting accurate placement of the medication.  Advised to call if fevers/chills, erythema, induration, drainage, or persistent bleeding.  Images saved Impression: Technically successful ultrasound guided injection.    Assessment and Plan:  Subluxation of extensor carpi ulnaris tendon, subsequent encounter Patient given injection and tolerated the procedure well, discussed icing regimen and home exercises, discussed which activities would be beneficial in which ones to avoid.  Increase  activity slowly.  Discussed icing regimen.  Follow-up again in 6 to 8 weeks    Nonallopathic problems  Decision today to treat with OMT was based on Physical Exam  After verbal consent patient was treated with HVLA, ME, FPR techniques in cervical, rib, thoracic, lumbar, and sacral  areas  Patient tolerated the procedure well with improvement in symptoms  Patient given exercises, stretches and lifestyle modifications  See medications in patient instructions if given  Patient will follow up in 4-8 weeks    The above documentation has been reviewed and is accurate and complete Malike Foglio M Solace Wendorff, DO          Note: This dictation was prepared with Dragon dictation along with smaller phrase technology. Any transcriptional errors that result from this process are unintentional.

## 2023-11-12 ENCOUNTER — Encounter: Payer: Self-pay | Admitting: Family Medicine

## 2023-11-12 ENCOUNTER — Ambulatory Visit: Admitting: Family Medicine

## 2023-11-12 ENCOUNTER — Other Ambulatory Visit: Payer: Self-pay

## 2023-11-12 VITALS — BP 112/70 | HR 81 | Ht 59.0 in | Wt 215.0 lb

## 2023-11-12 DIAGNOSIS — M9902 Segmental and somatic dysfunction of thoracic region: Secondary | ICD-10-CM

## 2023-11-12 DIAGNOSIS — S63093D Other subluxation of unspecified wrist and hand, subsequent encounter: Secondary | ICD-10-CM

## 2023-11-12 DIAGNOSIS — M9901 Segmental and somatic dysfunction of cervical region: Secondary | ICD-10-CM | POA: Diagnosis not present

## 2023-11-12 DIAGNOSIS — M9908 Segmental and somatic dysfunction of rib cage: Secondary | ICD-10-CM | POA: Diagnosis not present

## 2023-11-12 DIAGNOSIS — M9904 Segmental and somatic dysfunction of sacral region: Secondary | ICD-10-CM | POA: Diagnosis not present

## 2023-11-12 DIAGNOSIS — S63092D Other subluxation of left wrist and hand, subsequent encounter: Secondary | ICD-10-CM | POA: Diagnosis not present

## 2023-11-12 DIAGNOSIS — M9903 Segmental and somatic dysfunction of lumbar region: Secondary | ICD-10-CM | POA: Diagnosis not present

## 2023-11-12 DIAGNOSIS — G5701 Lesion of sciatic nerve, right lower limb: Secondary | ICD-10-CM | POA: Diagnosis not present

## 2023-11-12 DIAGNOSIS — M25532 Pain in left wrist: Secondary | ICD-10-CM

## 2023-11-12 NOTE — Assessment & Plan Note (Signed)
 Piriformis syndrome noted.  Discussed icing regimen and home exercises, discussed which activities to do and which ones to avoid.  Increase activity slowly.  Discussed icing regimen.  Continue to work on [employer].  Tightness noted in the neck.  We will monitor.  Follow-up again in 6 to 8 weeks

## 2023-11-12 NOTE — Assessment & Plan Note (Signed)
 Patient given injection and tolerated the procedure well, discussed icing regimen and home exercises, discussed which activities would be beneficial in which ones to avoid.  Increase activity slowly.  Discussed icing regimen.  Follow-up again in 6 to 8 weeks

## 2023-11-12 NOTE — Patient Instructions (Addendum)
 Injected wrist Painfree in 2 weeks See in 2 months

## 2023-12-07 ENCOUNTER — Other Ambulatory Visit (HOSPITAL_COMMUNITY): Payer: Self-pay

## 2023-12-08 ENCOUNTER — Other Ambulatory Visit (HOSPITAL_COMMUNITY): Payer: Self-pay

## 2023-12-08 ENCOUNTER — Telehealth: Payer: Self-pay

## 2023-12-08 NOTE — Telephone Encounter (Signed)
 Pharmacy Patient Advocate Encounter   Received notification from Onbase that prior authorization for Ozempic  2 is required/requested.   Insurance verification completed.   The patient is insured through [facility].   Per test claim: PA required; PA started via CoverMyMeds. KEY [id number] . Waiting for clinical questions to populate.

## 2023-12-14 ENCOUNTER — Other Ambulatory Visit (HOSPITAL_COMMUNITY): Payer: Self-pay

## 2023-12-14 NOTE — Telephone Encounter (Signed)
 Zoe [name], Zoe [name] - NCS#[facility]OR[facility])   Pharmacy Patient Advocate Encounter  Received notification from [facility] that Prior Authorization for Ozempic  2 has been CANCELLED due to Per test claim, patient does not have pharmacy benefits, please advise

## 2023-12-31 ENCOUNTER — Encounter: Payer: Self-pay | Admitting: Family Medicine

## 2023-12-31 MED ORDER — ALPRAZOLAM 1 MG PO TABS: 1.0000 mg | ORAL_TABLET | Freq: Two times a day (BID) | ORAL | 5 refills | Status: AC | PRN

## 2023-12-31 NOTE — Telephone Encounter (Signed)
 Done

## 2023-12-31 NOTE — Telephone Encounter (Signed)
 Rx is not in pt list of meds. Please advise

## 2024-01-05 ENCOUNTER — Encounter: Payer: Self-pay | Admitting: Family Medicine

## 2024-01-08 ENCOUNTER — Ambulatory Visit: Admitting: Family Medicine

## 2024-01-08 ENCOUNTER — Encounter: Payer: Self-pay | Admitting: Family Medicine

## 2024-01-08 VITALS — BP 110/74 | HR 87 | Temp 98.5°F | Wt 212.0 lb

## 2024-01-08 DIAGNOSIS — M79641 Pain in right hand: Secondary | ICD-10-CM | POA: Diagnosis not present

## 2024-01-08 DIAGNOSIS — M79642 Pain in left hand: Secondary | ICD-10-CM

## 2024-01-08 MED ORDER — CELECOXIB 200 MG PO CAPS: 200.0000 mg | ORAL_CAPSULE | Freq: Two times a day (BID) | ORAL | 2 refills | Status: AC

## 2024-01-08 NOTE — Progress Notes (Signed)
   Subjective:    Patient ID: Zoe [name], female    DOB: 1970/06/19, 53 y.o.   MRN: [id number]  HPI Here for aching pains in her fingers that started a year ago but which is much worse lately. This primarily involves the PIP joints of the 2nd -5th fingers of both hands. No swelling. It hurts to make a fist. She has taken Ibuprofen and has applied Voltaren  gel, but nothing helps.    Review of Systems  Constitutional: Negative.   Respiratory: Negative.    Cardiovascular: Negative.   Musculoskeletal:  Positive for arthralgias.       Objective:   Physical Exam Constitutional:      Appearance: She is obese.  Cardiovascular:     Rate and Rhythm: Normal rate and regular rhythm.     Pulses: Normal pulses.     Heart sounds: Normal heart sounds.  Pulmonary:     Effort: Pulmonary effort is normal.     Breath sounds: Normal breath sounds.  Musculoskeletal:     Comments: The PIP's of both hands are swollen and tender. The DIP's and wrists are not involved   Neurological:     Mental Status: She is alert.           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 She will take Celebrex 200 mg BID and report back to us  next week.  Garnette Olmsted, MD

## 2024-01-10 ENCOUNTER — Other Ambulatory Visit: Payer: Self-pay | Admitting: Family Medicine

## 2024-01-13 ENCOUNTER — Encounter: Payer: Self-pay | Admitting: Family Medicine

## 2024-01-13 NOTE — Telephone Encounter (Signed)
 FYI

## 2024-01-14 NOTE — Progress Notes (Deleted)
  Zoe [name] Zoe [name] [address] [place] [address] Phone: [phone] Subjective:    I'm seeing this patient by the request  of:  Johnny Garnette LABOR, MD  CC:   YEP:Dlagzrupcz  Zoe [name] is a 53 y.o. female coming in with complaint of back and neck pain. OMT on 11/12/2023. Patient states   Medications patient has been prescribed:   Taking:         Reviewed prior external information including notes and imaging from previsou exam, outside providers and external EMR if available.   As well as notes that were available from care everywhere and other healthcare systems.  Past medical history, social, surgical and family history all reviewed in electronic medical record.  No pertanent information unless stated regarding to the chief complaint.   Past Medical History:  Diagnosis Date   Allergy    Anemia    Benign hematuria    worked up by Dr. Mark Ottelin   Chickenpox    Horseshoe kidney    Hx of migraine headaches    Hyperlipidemia    Hypertension    Menorrhagia    resolved with ablation   Osteoarthritis    patient denies this dx   SVD (spontaneous vaginal delivery)    x 1   Thyroid  disease     Allergies  Allergen Reactions   Molds & Smuts Other (See Comments)   Penicillins Hives   Pollen Extract Other (See Comments)   Short Ragweed Pollen Ext Other (See Comments)     Review of Systems:  No headache, visual changes, nausea, vomiting, diarrhea, constipation, dizziness, abdominal pain, skin rash, fevers, chills, night sweats, weight loss, swollen lymph nodes, body aches, joint swelling, chest pain, shortness of breath, mood changes. POSITIVE muscle aches  Objective  There were no vitals taken for this visit.   General: No apparent distress alert and oriented x3 mood and affect normal, dressed appropriately.  HEENT: Pupils equal, extraocular movements intact  Respiratory: Patient's speak in full sentences and does not appear  short of breath  Cardiovascular: No lower extremity edema, non tender, no erythema  Gait MSK:  Back   Osteopathic findings  C2 flexed rotated and side bent right C6 flexed rotated and side bent left T3 extended rotated and side bent right inhaled rib T9 extended rotated and side bent left L2 flexed rotated and side bent right Sacrum right on right       Assessment and Plan:  No problem-specific Assessment & Plan notes found for this encounter.    Nonallopathic problems  Decision today to treat with OMT was based on Physical Exam  After verbal consent patient was treated with HVLA, ME, FPR techniques in cervical, rib, thoracic, lumbar, and sacral  areas  Patient tolerated the procedure well with improvement in symptoms  Patient given exercises, stretches and lifestyle modifications  See medications in patient instructions if given  Patient will follow up in 4-8 weeks             Note: This dictation was prepared with Dragon dictation along with smaller phrase technology. Any transcriptional errors that result from this process are unintentional.

## 2024-01-15 NOTE — Telephone Encounter (Signed)
 Sounds good

## 2024-01-19 ENCOUNTER — Ambulatory Visit: Admitting: Family Medicine

## 2024-02-09 ENCOUNTER — Ambulatory Visit: Admitting: Family Medicine

## 2024-02-09 ENCOUNTER — Encounter: Payer: Self-pay | Admitting: Family Medicine

## 2024-02-09 VITALS — BP 128/78 | HR 85 | Temp 97.8°F | Wt 209.0 lb

## 2024-02-09 DIAGNOSIS — E119 Type 2 diabetes mellitus without complications: Secondary | ICD-10-CM | POA: Diagnosis not present

## 2024-02-09 DIAGNOSIS — I10 Essential (primary) hypertension: Secondary | ICD-10-CM

## 2024-02-09 DIAGNOSIS — Z7984 Long term (current) use of oral hypoglycemic drugs: Secondary | ICD-10-CM

## 2024-02-09 DIAGNOSIS — Z7985 Long-term (current) use of injectable non-insulin antidiabetic drugs: Secondary | ICD-10-CM | POA: Diagnosis not present

## 2024-02-09 LAB — POCT GLYCOSYLATED HEMOGLOBIN (HGB A1C): Hemoglobin A1C: 5.9 % — AB (ref 4.0–5.6)

## 2024-02-09 NOTE — Addendum Note (Signed)
 Addended by: LADONNA INOCENTE SAILOR on: 02/09/2024 04:30 PM   Modules accepted: Orders

## 2024-02-09 NOTE — Progress Notes (Signed)
° °  Subjective:    Patient ID: Zoe [name], female    DOB: Jul 12, 1970, 53 y.o.   MRN: [id number]  HPI Here with concerns about low glucose levels. She started on Ozempic  about 6 months ago, and she has lost 11 lbs since then. Her diabetes has been under much better control. Her A1c in June was 9.6, in September it was 6.1, and today it is 5.9%. Her glucoses at home have been averaging 70-130. Over the past few weeks she has felt very fatigued and she has had frequent headaches. Sometimes she feels shaky so she eats something to feel better. Her BP has been stable.    Review of Systems  Constitutional:  Positive for fatigue.  Respiratory: Negative.    Cardiovascular: Negative.   Neurological:  Positive for headaches.       Objective:   Physical Exam Constitutional:      Appearance: She is obese. She is not ill-appearing.  Cardiovascular:     Rate and Rhythm: Normal rate and regular rhythm.     Pulses: Normal pulses.     Heart sounds: Normal heart sounds.  Pulmonary:     Effort: Pulmonary effort is normal.     Breath sounds: Normal breath sounds.  Neurological:     Mental Status: She is alert.           Assessment & Plan:  She is having symptoms from her rapidly normalizing blood glucose levels. We will stop the Glipizide  completely. She will stay on Metformin  500 mg BID and Ozempic  0.5 mg weekly. She will report back to us  in 2 weeks with her symptoms and her glucose levels. Her obesity is coming under better control, and her HTN is stable.  Garnette Olmsted, MD

## 2024-02-15 NOTE — Progress Notes (Signed)
 " [name] [address] [place] [address] Phone: [phone] Subjective:   [name] [name], am serving as a scribe for Dr. Arthea [name].  I'[name] [name] seeing this patient by the request  of:  [name] [name] LABOR, MD  CC: Back and neck pain follow-up  YEP:Dlagzrupcz  [name] [name] is a 53 y.o. female coming in with complaint of back and neck pain. OMT on 11/12/2023. Also f/u for L wrist and R hip pain.  Left extensor carpi ulnaris was injected in September.  Patient states that her pain in wrist has subsided but the swelling continues.  Back and hip are doing well.   Medications patient has been prescribed: None  Taking:         Reviewed prior external information including notes and imaging from previsou exam, outside providers and external EMR if available.   As well as notes that were available from care everywhere and other healthcare systems.  Past medical history, social, surgical and family history all reviewed in electronic medical record.  No pertanent information unless stated regarding to the chief complaint.   Past Medical History:  Diagnosis Date   Allergy    Anemia    Benign hematuria    worked up by Dr. Mark [name]   Chickenpox    Horseshoe kidney    Hx of migraine headaches    Hyperlipidemia    Hypertension    Menorrhagia    resolved with ablation   Osteoarthritis    patient denies this dx   SVD (spontaneous vaginal delivery)    x 1   Thyroid  disease     Allergies  Allergen Reactions   Molds & Smuts Other (See Comments)   Penicillins Hives   Pollen Extract Other (See Comments)   Short Ragweed Pollen Ext Other (See Comments)     Review of Systems:  No headache, visual changes, nausea, vomiting, diarrhea, constipation, dizziness, abdominal pain, skin rash, fevers, chills, night sweats, weight loss, swollen lymph nodes, body aches, joint swelling, chest pain, shortness of breath, mood changes. POSITIVE muscle  aches  Objective  There were no vitals taken for this visit.   General: No apparent distress alert and oriented x3 mood and affect normal, dressed appropriately.  HEENT: Pupils equal, extraocular movements intact  Respiratory: Patient's speak in full sentences and does not appear short of breath  Cardiovascular: No lower extremity edema, non tender, no erythema  Gait relatively normal MSK:   Left wrist exam shows still some swelling noted over the ECU.  Nontender.  Good grip strength low back does have some loss of lordosis noted.  Mild increase in kyphosis noted.  Osteopathic findings  C2 flexed rotated and side bent right C7 flexed rotated and side bent left T3 extended rotated and side bent right inhaled rib T9 extended rotated and side bent left L2 flexed rotated and side bent right L3 flexed rotated and side bent right Sacrum right on right   Limited muscular skeletal ultrasound was performed and interpreted by [name] [name], [name] [name]  Limited ultrasound shows significant improvement in the hypoechoic changes in the extensor carpi ulnaris.  No true tearing appreciated.  No increase in Doppler flow or neovascularization. Impression: Interval improvement    Assessment and Plan: Subluxation of extensor carpi ulnaris tendon, subsequent encounter Improvement noted at this time.  It has been nearly 4 months.  I think we can continue to monitor.  Discussed icing regimen and home exercises, discussed which activities to  do and which ones to avoid.  Increase activity slowly.  Discussed icing regimen.  Follow-up again in 6 to 12 weeks  Low back pain Chronic problem, continue to work on core strengthening.  Discussed icing regimen and home exercises, increase activity slowly.  Follow-up again in 6 to 8 weeks.     Nonallopathic problems  Decision today to treat with OMT was based on Physical Exam  After verbal consent patient was treated with HVLA, ME, FPR techniques in cervical, rib,  thoracic, lumbar, and sacral  areas  Patient tolerated the procedure well with improvement in symptoms  Patient given exercises, stretches and lifestyle modifications  See medications in patient instructions if given  Patient will follow up in 4-8 weeks     The above documentation has been reviewed and is accurate and complete [name] [name] [name] [name] [name] Sellers, DO         Note: This dictation was prepared with Dragon dictation along with smaller phrase technology. Any transcriptional errors that result from this process are unintentional.         "

## 2024-02-16 ENCOUNTER — Other Ambulatory Visit: Payer: Self-pay | Admitting: Family Medicine

## 2024-02-17 ENCOUNTER — Encounter: Payer: Self-pay | Admitting: Family Medicine

## 2024-02-17 ENCOUNTER — Other Ambulatory Visit: Payer: Self-pay

## 2024-02-17 ENCOUNTER — Ambulatory Visit: Admitting: Family Medicine

## 2024-02-17 VITALS — BP 110/82 | HR 76 | Ht 59.0 in | Wt 208.0 lb

## 2024-02-17 DIAGNOSIS — G8929 Other chronic pain: Secondary | ICD-10-CM

## 2024-02-17 DIAGNOSIS — S63093D Other subluxation of unspecified wrist and hand, subsequent encounter: Secondary | ICD-10-CM

## 2024-02-17 DIAGNOSIS — M9901 Segmental and somatic dysfunction of cervical region: Secondary | ICD-10-CM | POA: Diagnosis not present

## 2024-02-17 DIAGNOSIS — M9904 Segmental and somatic dysfunction of sacral region: Secondary | ICD-10-CM

## 2024-02-17 DIAGNOSIS — M9903 Segmental and somatic dysfunction of lumbar region: Secondary | ICD-10-CM

## 2024-02-17 DIAGNOSIS — M9908 Segmental and somatic dysfunction of rib cage: Secondary | ICD-10-CM

## 2024-02-17 DIAGNOSIS — M25532 Pain in left wrist: Secondary | ICD-10-CM

## 2024-02-17 DIAGNOSIS — M545 Low back pain, unspecified: Secondary | ICD-10-CM | POA: Diagnosis not present

## 2024-02-17 DIAGNOSIS — M9902 Segmental and somatic dysfunction of thoracic region: Secondary | ICD-10-CM | POA: Diagnosis not present

## 2024-02-17 NOTE — Assessment & Plan Note (Signed)
 Improvement noted at this time.  It has been nearly 4 months.  I think we can continue to monitor.  Discussed icing regimen and home exercises, discussed which activities to do and which ones to avoid.  Increase activity slowly.  Discussed icing regimen.  Follow-up again in 6 to 12 weeks

## 2024-02-17 NOTE — Assessment & Plan Note (Signed)
 Chronic problem, continue to work on core strengthening.  Discussed icing regimen and home exercises, increase activity slowly.  Follow-up again in 6 to 8 weeks.

## 2024-02-17 NOTE — Patient Instructions (Signed)
 Enjoy your time off See me in 2-3 months

## 2024-02-18 ENCOUNTER — Other Ambulatory Visit: Payer: Self-pay | Admitting: Family Medicine

## 2024-02-22 ENCOUNTER — Other Ambulatory Visit: Payer: Self-pay | Admitting: Family Medicine

## 2024-02-22 NOTE — Telephone Encounter (Unsigned)
 Copied from CRM #[id number]. Topic: Clinical - Medication Refill >> Feb 22, 2024  3:00 PM Zoe [name] wrote: Medication: montelukast  (SINGULAIR ) 10 MG tablet  Has the patient contacted their pharmacy? No (Agent: If no, request that the patient contact the pharmacy for the refill. If patient does not wish to contact the pharmacy document the reason why and proceed with request.) (Agent: If yes, when and what did the pharmacy advise?)  This is the patient's preferred pharmacy:  [facility] [phone] - [place], [place] - [address] AT [facility] [address] [place] [address] Phone: [phone] Fax: [phone]  Is this the correct pharmacy for this prescription? Yes If no, delete pharmacy and type the correct one.   Has the prescription been filled recently? No  Is the patient out of the medication? Unsure  Has the patient been seen for an appointment in the last year OR does the patient have an upcoming appointment? Yes  Can we respond through MyChart? No  Agent: Please be advised that Rx refills may take up to 3 business days. We ask that you follow-up with your pharmacy.

## 2024-02-23 ENCOUNTER — Encounter: Payer: Self-pay | Admitting: Family Medicine

## 2024-03-08 ENCOUNTER — Ambulatory Visit: Admitting: Family Medicine

## 2024-03-10 ENCOUNTER — Encounter: Payer: Self-pay | Admitting: Family Medicine

## 2024-03-11 ENCOUNTER — Ambulatory Visit: Admitting: Family Medicine

## 2024-03-11 ENCOUNTER — Encounter: Payer: Self-pay | Admitting: Family Medicine

## 2024-03-11 VITALS — BP 126/78 | HR 83 | Temp 98.0°F | Ht 59.0 in | Wt 203.6 lb

## 2024-03-11 DIAGNOSIS — I10 Essential (primary) hypertension: Secondary | ICD-10-CM | POA: Diagnosis not present

## 2024-03-11 DIAGNOSIS — Z7985 Long-term (current) use of injectable non-insulin antidiabetic drugs: Secondary | ICD-10-CM | POA: Diagnosis not present

## 2024-03-11 DIAGNOSIS — Z7984 Long term (current) use of oral hypoglycemic drugs: Secondary | ICD-10-CM

## 2024-03-11 DIAGNOSIS — E119 Type 2 diabetes mellitus without complications: Secondary | ICD-10-CM | POA: Diagnosis not present

## 2024-03-11 MED ORDER — PREDNISONE 20 MG PO TABS: 40.0000 mg | ORAL_TABLET | Freq: Every day | ORAL | 0 refills | Status: AC

## 2024-03-11 NOTE — Progress Notes (Signed)
" ° °  Subjective:    Patient ID: Zoe [name], female    DOB: Aug 10, 1970, 54 y.o.   MRN: [id number]  HPI Here to follow up on type 2 diabetes. Four weeks ago she was experiencing weakness and headaches, and we felt this was due to low glucoses. Her A1c that day was 5.9%, and she was getting glucose readings as low as 70 at home. We stopped her Glipizide , and she has continued to take Metformin  and Ozempic . Since then her fasting glucoses have averaged in the 120's, and she feels much better.    Review of Systems  Constitutional: Negative.   Respiratory: Negative.    Cardiovascular: Negative.        Objective:   Physical Exam Constitutional:      Appearance: She is obese.  Cardiovascular:     Rate and Rhythm: Normal rate and regular rhythm.     Pulses: Normal pulses.     Heart sounds: Normal heart sounds.  Pulmonary:     Effort: Pulmonary effort is normal.     Breath sounds: Normal breath sounds.  Neurological:     Mental Status: She is alert.           Assessment & Plan:  Her type 2 diabetes is well controlled, and she will stay on the current regimen. Her HTN is stable. We will check another A1c in 2 months.  Garnette Olmsted, MD   "

## 2024-04-19 ENCOUNTER — Ambulatory Visit: Admitting: Family Medicine

## 2024-05-09 ENCOUNTER — Ambulatory Visit: Admitting: Family Medicine
